# Patient Record
Sex: Male | Born: 1940 | Race: White | Hispanic: No | Marital: Married | Smoking: Former smoker
Health system: Southern US, Community
[De-identification: ages and names within clinical notes are randomized; demographics above are authoritative.]

## PROBLEM LIST (undated history)

## (undated) DIAGNOSIS — L988 Other specified disorders of the skin and subcutaneous tissue: Secondary | ICD-10-CM

## (undated) DIAGNOSIS — F329 Major depressive disorder, single episode, unspecified: Secondary | ICD-10-CM

## (undated) DIAGNOSIS — N189 Chronic kidney disease, unspecified: Secondary | ICD-10-CM

## (undated) DIAGNOSIS — I429 Cardiomyopathy, unspecified: Secondary | ICD-10-CM

## (undated) DIAGNOSIS — E669 Obesity, unspecified: Secondary | ICD-10-CM

## (undated) DIAGNOSIS — D649 Anemia, unspecified: Secondary | ICD-10-CM

## (undated) DIAGNOSIS — I214 Non-ST elevation (NSTEMI) myocardial infarction: Secondary | ICD-10-CM

## (undated) DIAGNOSIS — N138 Other obstructive and reflux uropathy: Secondary | ICD-10-CM

## (undated) DIAGNOSIS — D179 Benign lipomatous neoplasm, unspecified: Secondary | ICD-10-CM

## (undated) DIAGNOSIS — I5022 Chronic systolic (congestive) heart failure: Secondary | ICD-10-CM

## (undated) DIAGNOSIS — J449 Chronic obstructive pulmonary disease, unspecified: Secondary | ICD-10-CM

## (undated) DIAGNOSIS — R0602 Shortness of breath: Secondary | ICD-10-CM

## (undated) DIAGNOSIS — H5440 Blindness, one eye, unspecified eye: Secondary | ICD-10-CM

## (undated) DIAGNOSIS — M1990 Unspecified osteoarthritis, unspecified site: Secondary | ICD-10-CM

## (undated) DIAGNOSIS — N401 Enlarged prostate with lower urinary tract symptoms: Secondary | ICD-10-CM

## (undated) DIAGNOSIS — K219 Gastro-esophageal reflux disease without esophagitis: Secondary | ICD-10-CM

## (undated) DIAGNOSIS — I219 Acute myocardial infarction, unspecified: Secondary | ICD-10-CM

## (undated) DIAGNOSIS — I2510 Atherosclerotic heart disease of native coronary artery without angina pectoris: Secondary | ICD-10-CM

## (undated) DIAGNOSIS — H9190 Unspecified hearing loss, unspecified ear: Secondary | ICD-10-CM

## (undated) DIAGNOSIS — K3184 Gastroparesis: Secondary | ICD-10-CM

## (undated) DIAGNOSIS — I10 Essential (primary) hypertension: Secondary | ICD-10-CM

## (undated) DIAGNOSIS — R51 Headache: Secondary | ICD-10-CM

## (undated) DIAGNOSIS — K635 Polyp of colon: Secondary | ICD-10-CM

## (undated) DIAGNOSIS — I4891 Unspecified atrial fibrillation: Secondary | ICD-10-CM

## (undated) DIAGNOSIS — I9789 Other postprocedural complications and disorders of the circulatory system, not elsewhere classified: Secondary | ICD-10-CM

## (undated) DIAGNOSIS — K649 Unspecified hemorrhoids: Secondary | ICD-10-CM

## (undated) DIAGNOSIS — F32A Depression, unspecified: Secondary | ICD-10-CM

## (undated) HISTORY — DX: Unspecified atrial fibrillation: I48.91

## (undated) HISTORY — PX: EYE SURGERY: SHX253

## (undated) HISTORY — PX: NECK SURGERY: SHX720

## (undated) HISTORY — DX: Other specified disorders of the skin and subcutaneous tissue: L98.8

## (undated) HISTORY — PX: APPENDECTOMY: SHX54

## (undated) HISTORY — PX: SHOULDER SURGERY: SHX246

## (undated) HISTORY — DX: Chronic systolic (congestive) heart failure: I50.22

## (undated) HISTORY — DX: Non-ST elevation (NSTEMI) myocardial infarction: I21.4

## (undated) HISTORY — DX: Other obstructive and reflux uropathy: N13.8

## (undated) HISTORY — PX: SHOULDER ARTHROSCOPY: SHX128

## (undated) HISTORY — PX: HERNIA REPAIR: SHX51

## (undated) HISTORY — DX: Other obstructive and reflux uropathy: N40.1

## (undated) HISTORY — PX: CHOLECYSTECTOMY: SHX55

## (undated) HISTORY — DX: Other postprocedural complications and disorders of the circulatory system, not elsewhere classified: I97.89

---

## 1989-06-20 HISTORY — PX: KNEE SURGERY: SHX244

## 1997-12-15 ENCOUNTER — Encounter: Admission: RE | Admit: 1997-12-15 | Discharge: 1998-03-15 | Payer: Self-pay | Admitting: Internal Medicine

## 1998-03-24 ENCOUNTER — Encounter: Admission: RE | Admit: 1998-03-24 | Discharge: 1998-06-22 | Payer: Self-pay | Admitting: Internal Medicine

## 1998-06-26 ENCOUNTER — Encounter: Admission: RE | Admit: 1998-06-26 | Discharge: 1998-07-02 | Payer: Self-pay | Admitting: Internal Medicine

## 1998-10-05 ENCOUNTER — Encounter: Admission: RE | Admit: 1998-10-05 | Discharge: 1998-10-08 | Payer: Self-pay | Admitting: Orthopedic Surgery

## 1999-01-04 ENCOUNTER — Encounter: Admission: RE | Admit: 1999-01-04 | Discharge: 1999-01-18 | Payer: Self-pay | Admitting: Orthopedic Surgery

## 1999-01-20 ENCOUNTER — Encounter: Admission: RE | Admit: 1999-01-20 | Discharge: 1999-04-20 | Payer: Self-pay | Admitting: Orthopedic Surgery

## 1999-04-28 ENCOUNTER — Encounter: Admission: RE | Admit: 1999-04-28 | Discharge: 1999-07-27 | Payer: Self-pay | Admitting: Internal Medicine

## 1999-08-10 ENCOUNTER — Encounter: Admission: RE | Admit: 1999-08-10 | Discharge: 1999-11-08 | Payer: Self-pay | Admitting: Internal Medicine

## 1999-12-07 ENCOUNTER — Encounter: Admission: RE | Admit: 1999-12-07 | Discharge: 2000-03-06 | Payer: Self-pay | Admitting: Internal Medicine

## 2000-02-24 ENCOUNTER — Encounter: Admission: RE | Admit: 2000-02-24 | Discharge: 2000-05-24 | Payer: Self-pay | Admitting: Internal Medicine

## 2000-03-02 ENCOUNTER — Encounter (INDEPENDENT_AMBULATORY_CARE_PROVIDER_SITE_OTHER): Payer: Self-pay | Admitting: *Deleted

## 2000-03-02 ENCOUNTER — Ambulatory Visit (HOSPITAL_COMMUNITY): Admission: RE | Admit: 2000-03-02 | Discharge: 2000-03-02 | Payer: Self-pay | Admitting: Gastroenterology

## 2000-03-29 ENCOUNTER — Ambulatory Visit (HOSPITAL_COMMUNITY): Admission: RE | Admit: 2000-03-29 | Discharge: 2000-03-29 | Payer: Self-pay | Admitting: Cardiology

## 2000-04-12 ENCOUNTER — Encounter: Admission: RE | Admit: 2000-04-12 | Discharge: 2000-07-11 | Payer: Self-pay | Admitting: Internal Medicine

## 2000-05-29 ENCOUNTER — Encounter: Admission: RE | Admit: 2000-05-29 | Discharge: 2000-08-27 | Payer: Self-pay | Admitting: Internal Medicine

## 2000-07-26 ENCOUNTER — Encounter: Admission: RE | Admit: 2000-07-26 | Discharge: 2000-07-31 | Payer: Self-pay | Admitting: Internal Medicine

## 2000-08-28 ENCOUNTER — Encounter: Admission: RE | Admit: 2000-08-28 | Discharge: 2000-11-26 | Payer: Self-pay | Admitting: Internal Medicine

## 2000-11-01 ENCOUNTER — Encounter: Admission: RE | Admit: 2000-11-01 | Discharge: 2000-11-06 | Payer: Self-pay | Admitting: Internal Medicine

## 2000-12-07 ENCOUNTER — Encounter: Admission: RE | Admit: 2000-12-07 | Discharge: 2001-03-07 | Payer: Self-pay | Admitting: Internal Medicine

## 2001-03-07 ENCOUNTER — Encounter: Admission: RE | Admit: 2001-03-07 | Discharge: 2001-03-12 | Payer: Self-pay | Admitting: Internal Medicine

## 2001-03-20 ENCOUNTER — Encounter: Admission: RE | Admit: 2001-03-20 | Discharge: 2001-06-18 | Payer: Self-pay | Admitting: Internal Medicine

## 2001-04-08 ENCOUNTER — Emergency Department (HOSPITAL_COMMUNITY): Admission: EM | Admit: 2001-04-08 | Discharge: 2001-04-08 | Payer: Self-pay | Admitting: Emergency Medicine

## 2001-04-17 ENCOUNTER — Encounter: Payer: Self-pay | Admitting: Internal Medicine

## 2001-04-17 ENCOUNTER — Ambulatory Visit (HOSPITAL_COMMUNITY): Admission: RE | Admit: 2001-04-17 | Discharge: 2001-04-17 | Payer: Self-pay | Admitting: Internal Medicine

## 2001-04-18 ENCOUNTER — Encounter: Admission: RE | Admit: 2001-04-18 | Discharge: 2001-04-18 | Payer: Self-pay | Admitting: Internal Medicine

## 2001-06-27 ENCOUNTER — Encounter: Admission: RE | Admit: 2001-06-27 | Discharge: 2001-07-02 | Payer: Self-pay | Admitting: Internal Medicine

## 2001-09-11 ENCOUNTER — Ambulatory Visit (HOSPITAL_COMMUNITY): Admission: RE | Admit: 2001-09-11 | Discharge: 2001-09-11 | Payer: Self-pay | Admitting: Gastroenterology

## 2001-09-11 ENCOUNTER — Encounter (INDEPENDENT_AMBULATORY_CARE_PROVIDER_SITE_OTHER): Payer: Self-pay | Admitting: Specialist

## 2001-10-02 ENCOUNTER — Encounter (HOSPITAL_BASED_OUTPATIENT_CLINIC_OR_DEPARTMENT_OTHER): Admission: RE | Admit: 2001-10-02 | Discharge: 2001-10-08 | Payer: Self-pay | Admitting: Orthopedic Surgery

## 2001-12-19 ENCOUNTER — Encounter (HOSPITAL_BASED_OUTPATIENT_CLINIC_OR_DEPARTMENT_OTHER): Admission: RE | Admit: 2001-12-19 | Discharge: 2002-02-28 | Payer: Self-pay | Admitting: Internal Medicine

## 2002-03-20 ENCOUNTER — Encounter (HOSPITAL_BASED_OUTPATIENT_CLINIC_OR_DEPARTMENT_OTHER): Admission: RE | Admit: 2002-03-20 | Discharge: 2002-06-18 | Payer: Self-pay | Admitting: Internal Medicine

## 2002-07-02 ENCOUNTER — Encounter (HOSPITAL_BASED_OUTPATIENT_CLINIC_OR_DEPARTMENT_OTHER): Admission: RE | Admit: 2002-07-02 | Discharge: 2002-09-30 | Payer: Self-pay | Admitting: Internal Medicine

## 2002-09-02 ENCOUNTER — Ambulatory Visit (HOSPITAL_COMMUNITY): Admission: RE | Admit: 2002-09-02 | Discharge: 2002-09-02 | Payer: Self-pay | Admitting: Cardiology

## 2002-10-24 ENCOUNTER — Encounter (HOSPITAL_BASED_OUTPATIENT_CLINIC_OR_DEPARTMENT_OTHER): Admission: RE | Admit: 2002-10-24 | Discharge: 2003-01-22 | Payer: Self-pay | Admitting: Internal Medicine

## 2003-01-24 ENCOUNTER — Encounter (HOSPITAL_BASED_OUTPATIENT_CLINIC_OR_DEPARTMENT_OTHER): Admission: RE | Admit: 2003-01-24 | Discharge: 2003-03-19 | Payer: Self-pay | Admitting: Internal Medicine

## 2003-06-20 ENCOUNTER — Encounter (HOSPITAL_BASED_OUTPATIENT_CLINIC_OR_DEPARTMENT_OTHER): Admission: RE | Admit: 2003-06-20 | Discharge: 2003-09-11 | Payer: Self-pay | Admitting: Internal Medicine

## 2003-09-25 ENCOUNTER — Encounter (HOSPITAL_BASED_OUTPATIENT_CLINIC_OR_DEPARTMENT_OTHER): Admission: RE | Admit: 2003-09-25 | Discharge: 2003-11-26 | Payer: Self-pay | Admitting: Internal Medicine

## 2004-01-14 ENCOUNTER — Encounter (HOSPITAL_BASED_OUTPATIENT_CLINIC_OR_DEPARTMENT_OTHER): Admission: RE | Admit: 2004-01-14 | Discharge: 2004-03-24 | Payer: Self-pay | Admitting: Internal Medicine

## 2004-07-06 ENCOUNTER — Encounter (HOSPITAL_BASED_OUTPATIENT_CLINIC_OR_DEPARTMENT_OTHER): Admission: RE | Admit: 2004-07-06 | Discharge: 2004-08-27 | Payer: Self-pay | Admitting: Internal Medicine

## 2004-08-25 ENCOUNTER — Encounter: Admission: RE | Admit: 2004-08-25 | Discharge: 2004-08-25 | Payer: Self-pay | Admitting: Internal Medicine

## 2004-10-18 DIAGNOSIS — I214 Non-ST elevation (NSTEMI) myocardial infarction: Secondary | ICD-10-CM

## 2004-10-18 HISTORY — DX: Non-ST elevation (NSTEMI) myocardial infarction: I21.4

## 2004-10-19 ENCOUNTER — Ambulatory Visit (HOSPITAL_COMMUNITY): Admission: RE | Admit: 2004-10-19 | Discharge: 2004-10-20 | Payer: Self-pay | Admitting: Ophthalmology

## 2004-10-22 ENCOUNTER — Encounter (HOSPITAL_BASED_OUTPATIENT_CLINIC_OR_DEPARTMENT_OTHER): Admission: RE | Admit: 2004-10-22 | Discharge: 2005-01-20 | Payer: Self-pay | Admitting: Surgery

## 2004-11-15 ENCOUNTER — Inpatient Hospital Stay (HOSPITAL_COMMUNITY): Admission: EM | Admit: 2004-11-15 | Discharge: 2004-11-19 | Payer: Self-pay | Admitting: Emergency Medicine

## 2004-11-16 ENCOUNTER — Encounter (INDEPENDENT_AMBULATORY_CARE_PROVIDER_SITE_OTHER): Payer: Self-pay | Admitting: *Deleted

## 2004-12-20 ENCOUNTER — Encounter (HOSPITAL_COMMUNITY): Admission: RE | Admit: 2004-12-20 | Discharge: 2005-03-07 | Payer: Self-pay | Admitting: Cardiology

## 2005-07-18 ENCOUNTER — Encounter: Admission: RE | Admit: 2005-07-18 | Discharge: 2005-07-18 | Payer: Self-pay | Admitting: Internal Medicine

## 2005-07-19 ENCOUNTER — Encounter: Admission: RE | Admit: 2005-07-19 | Discharge: 2005-07-19 | Payer: Self-pay | Admitting: Internal Medicine

## 2005-08-15 ENCOUNTER — Inpatient Hospital Stay (HOSPITAL_COMMUNITY): Admission: AD | Admit: 2005-08-15 | Discharge: 2005-08-19 | Payer: Self-pay | Admitting: General Surgery

## 2005-08-15 ENCOUNTER — Encounter (INDEPENDENT_AMBULATORY_CARE_PROVIDER_SITE_OTHER): Payer: Self-pay | Admitting: *Deleted

## 2005-09-20 ENCOUNTER — Encounter: Admission: RE | Admit: 2005-09-20 | Discharge: 2005-09-20 | Payer: Self-pay | Admitting: Internal Medicine

## 2005-10-18 ENCOUNTER — Encounter: Admission: RE | Admit: 2005-10-18 | Discharge: 2005-10-18 | Payer: Self-pay | Admitting: Internal Medicine

## 2006-06-20 HISTORY — PX: AV FISTULA PLACEMENT: SHX1204

## 2006-10-23 ENCOUNTER — Ambulatory Visit: Payer: Self-pay | Admitting: Vascular Surgery

## 2006-11-15 ENCOUNTER — Encounter: Admission: RE | Admit: 2006-11-15 | Discharge: 2006-11-15 | Payer: Self-pay | Admitting: Internal Medicine

## 2006-11-28 ENCOUNTER — Ambulatory Visit: Payer: Self-pay | Admitting: Vascular Surgery

## 2006-11-28 ENCOUNTER — Ambulatory Visit (HOSPITAL_COMMUNITY): Admission: RE | Admit: 2006-11-28 | Discharge: 2006-11-28 | Payer: Self-pay | Admitting: Vascular Surgery

## 2007-02-09 ENCOUNTER — Ambulatory Visit: Payer: Self-pay | Admitting: Vascular Surgery

## 2007-08-21 ENCOUNTER — Encounter: Admission: RE | Admit: 2007-08-21 | Discharge: 2007-08-21 | Payer: Self-pay | Admitting: Geriatric Medicine

## 2008-02-01 ENCOUNTER — Ambulatory Visit: Payer: Self-pay | Admitting: Vascular Surgery

## 2010-01-29 ENCOUNTER — Emergency Department (HOSPITAL_COMMUNITY): Admission: EM | Admit: 2010-01-29 | Discharge: 2010-01-29 | Payer: Self-pay | Admitting: Emergency Medicine

## 2010-07-11 ENCOUNTER — Encounter: Payer: Self-pay | Admitting: Internal Medicine

## 2010-11-02 NOTE — Assessment & Plan Note (Signed)
OFFICE VISIT   FERGUS, THRONE  DOB:  11-10-1940                                       02/01/2008  GNFAO#:[id number]   The patient presents today for continued evaluation of AV access.  He is  status post placement of the right upper arm AV fistula creation on June  2008.  He continues to be relatively stable from a renal standpoint and  is not on hemodialysis.  His right upper arm fistula continues to have a  pulse in the proximal portion but does not have a thrill or bruit that  does not have a pulse in the distal portion.  I feel that he has  occluded the proximal cephalic vein and has static flow in the lower  portion of this.  I do not feel that there is any option for salvage of  the fistula which was essentially occluded.  My last note from Dr.  Eliott Nine reveals that his last BUN and creatinine in March were very  stable at 23 and 1.5 respectively.  I discussed this with the patient  and would not recommend attempt a new AV access since his renal function  has stabilized.  We would recommend attempted left upper arm AV fistula.  However, his vein mapping prior to the right arm fistula showed that the  left vein was smaller than the right with a high risk for early failure.  In all likelihood I think that if he does come to hemodialysis, he will  require an AV graft and would recommend delaying placement of this until  he approaches hemodialysis.   Larina Earthly, M.D.  Electronically Signed   TFE/MEDQ  D:  02/01/2008  T:  02/04/2008  Job:  [id number]   cc:   Duke Salvia. Eliott Nine, M.D.

## 2010-11-02 NOTE — Op Note (Signed)
NAME:  [name], [name] NO.:  [id number]   MEDICAL RECORD NO.:  [id number]          PATIENT TYPE:  AMB   LOCATION:  SDS                          FACILITY:  [facility]   PHYSICIAN:  Larina Earthly, M.D.    DATE OF BIRTH:  [birth year]   DATE OF PROCEDURE:  11/28/2006  DATE OF DISCHARGE:                               OPERATIVE REPORT   PREOPERATIVE DIAGNOSIS:  Chronic renal insufficiency.   POSTOPERATIVE DIAGNOSIS:  Chronic renal insufficiency.   PROCEDURES:  Right upper arm arteriovenous fistula creation.   SURGEON:  Larina Earthly, M.D.   ASSISTANT:  Coral Ceo, P.A.   ANESTHESIA:  Monitored anesthesia care.   COMPLICATIONS:  None.   DISPOSITION:  To recovery room stable.   PROCEDURE IN DETAIL:  The patient was taken to the operating room,  placed in supine position.  The area of the right arm prepped and draped  in the usual sterile fashion.  An incision was made over the antecubital  space, carried down to isolate the brachial artery, which was of good  caliber.  The cephalic vein was located slightly lateral and was  mobilized proximally and distally.  The vein was of adequate caliber at  the antecubital space and proximally did have multiple branches and  became quite small distally.  The small branches were ligated and  divided.  The vein was mobilized to the level of the brachial artery.  The artery was occluded proximally and distally and was opened with the  11 blade and dissected longitudinally with Potts scissors.  The vein was  spatulated and sewn end to side to the artery with a running 6-0 Prolene  suture.  The clamps were removed and good thrill was noted.  The wound  was irrigated with saline.  Hemostasis was obtained with electrocautery.  The wounds were closed with 3-0 Vicryl in the subcutaneous and  subcuticular tissue.  Benzoin and Steri-Strips were applied.      Larina Earthly, M.D.  Electronically Signed     TFE/MEDQ  D:  11/28/2006   T:  11/28/2006  Job:  [id number]

## 2010-11-02 NOTE — Assessment & Plan Note (Signed)
OFFICE VISIT   ORVILL, COULTHARD  DOB:  09-20-1940                                       02/09/2007  JXBJY#:[id number]   [name] presents today for evaluation of left AV fistula, creation  by me on November 24, 2006.  He has an upper arm right AV fistula.  He is  here with his wife today who is giving his history.  She reports that  Dr. Eliott Nine feels his renal function is stable.  He has had some mildly  elevated potassium.  They are trying to control this with diet.  His  earlier cubital incision is completely healed.  He does have a well-  developed cephalic vein near the antecubital space.  It does run rather  deep in the upper arm.  I explained to them that he certainly would not  be able to use this access, if he had renal dialysis imminently.  He  continued to have development of this, and I would recommend continued  observation only.  He may require surgery to immobilize the cephalic  vein more superficial, should he progress to renal failure.  I feel that  the longer his vein has to mature prior to this, the better his outcome  would be.  He understands this and will continue to exercise with a  squeeze ball, for the next several months.  He will see me on a p.r.n.  basis.   Larina Earthly, M.D.  Electronically Signed   TFE/MEDQ  D:  02/09/2007  T:  02/12/2007  Job:  [id number]   cc:   Duke Salvia. Eliott Nine, M.D.

## 2010-11-05 NOTE — Op Note (Signed)
NAME:  [name], [name] NO.:  [id number]   MEDICAL RECORD NO.:  [id number]          PATIENT TYPE:  OIB   LOCATION:  6524                         FACILITY:  [facility]   PHYSICIAN:  Guadelupe Sabin, M.D.DATE OF BIRTH:  04-11-1941   DATE OF PROCEDURE:  10/19/2004  DATE OF DISCHARGE:                                 OPERATIVE REPORT   PREOPERATIVE DIAGNOSES:  1.  Chronic and recurrent vitreous hemorrhage, left eye.  2.  Proliferative diabetic retinopathy, both eyes.  3.  Insulin-dependent diabetes mellitus.   POSTOPERATIVE DIAGNOSES:  1.  Chronic and recurrent vitreous hemorrhage, left eye.  2.  Proliferative diabetic retinopathy, both eyes.  3.  Insulin-dependent diabetes mellitus.   NAME OF OPERATION:  Pars plana vitrectomy using vitreous infusion suction  cutter, peripheral iridotomy, sphincterotomy.   SURGEON:  Guadelupe Sabin, M.D.   ASSISTANT:  Nurse.   ANESTHESIA:  General anesthesia.   OPERATIVE PROCEDURE:  After the patient was prepped and draped, a lid  speculum was inserted in the left eye.  The eye was turned downward and a  superior rectus traction suture placed.  A peritomy was performed adjacent  to the limbus from the 9 to 4 o'clock position temporally.  Three sclerotomy  sites were prepared at the 10, 2 and 4 o'clock position , 3.5 mm from the  limbus using the 20-gauge MVR blade.  The 4-mm vitreous infusion terminal  was secured in place at the 4 o'clock position with a 5-0 green Mersilene  suture.  The fiberoptic light pipe was inserted at the 2 o'clock position  and the handpiece of the vitreous infusion suction cutter at the 10 o'clock  position.  It was noted that the superior sclerotomy sites were temporarily  plugged and attention paid to the anterior segment.  The pupil was extremely  small at approximately 3 mm.  It was then elected to use the Grieshaber iris  retractors, which were placed at the 8, 10, 2 and  4 o'clock positions;  these were inserted through a tiny incision made with the [facility]  paracentesis knife.  There was a slight bleeding as the iris was retracted;  a good pupillary opening, however, was achieved, measuring approximately 6 x  5 mm.  Attention was then paid to the posterior segment, where the  fiberoptic light pipe was reinserted and the handpiece of the vitreous  infusion suction cutter.  Dense vitreous hemorrhage was encountered as the  vitreous and fibrillar material were removed.  As the retina was approached,  there appeared to be a proliferative tractional site on the temporal  superior area and this was trimmed back to the retinal surface.  Visualization was extremely poor due to the anterior chamber hemorrhage from  the iris retractors and the dense vitreous hemorrhage.  Gradually, however,  the surface blood in the anterior chamber cleared as well as the vitreous  hemorrhage aspirated.  The optic nerve was visualized and appeared to be  pale with a shallow large cup approximately 6-7 mm.  The retinal blood  vessels were attenuated; the macular area appeared clear.  Scleral  depression was used to remove the more peripheral blood clots.  The fundus  was inspected with indirect ophthalmoscopy and no further proliferative  sites were noted and the optic nerves showed no neovascularization.  Due to  the very extensive panretinal laser photocoagulation, it was elected not to  add additional laser at this time due to poor visualization and the  extensive laser photocoagulation previously.  The vitreous instruments were  withdrawn and the sclerotomy sites once again plugged.  Attention was paid  to the anterior segment, where the iris retractors were removed.  It was  elected to perform a tiny peripheral iridotomy with the handpiece of the  vitreous infusion suction cutter and multiple small sphincterotomies to  allow better visualization of the fundus postoperatively; this was performed   through the corneal iris retractor incision at the 10:30 position.  It was  then elected to close.  The sclerotomy sites were closed with 7-0  interrupted Vicryl sutures.  The tiny paracentesis openings were self-  sealing and no sutures were required.  The conjunctiva was pulled forward  and closed with a running 7-0 Vicryl suture.  Depo-Garamycin and  dexamethasone were injected in the sub-tenon space inferiorly.  Maxitrol and  atropine ointment were instilled in the conjunctival cul-de-sac.  A light  patch and protective shield were applied.  Duration of procedure -- 1 to 1-  1/2 hours.  The patient tolerated the procedure well in general and left the  operating room for the recovery room and subsequently to the 23-hour  observation unit.      HNJ/MEDQ  D:  10/19/2004  T:  10/20/2004  Job:  [id number]   cc:   Vernell Leep., M.D.  [address] [place]  [place] [address]  Fax: [phone]   Francisca December, M.D.  [address]  [place]  [place] [address]  Fax: [phone]   Georgann Housekeeper, MD  [address]  [place]  [place] [address]  Fax: [phone]

## 2010-11-05 NOTE — Discharge Summary (Signed)
NAME:  [name], [name] NO.:  [id number]   MEDICAL RECORD NO.:  [id number]          PATIENT TYPE:  INP   LOCATION:  6523                         FACILITY:  [facility]   PHYSICIAN:  Georgann Housekeeper, MD      DATE OF BIRTH:  1940/11/27   DATE OF ADMISSION:  11/15/2004  DATE OF DISCHARGE:  11/19/2004                                 DISCHARGE SUMMARY   DISCHARGE DIAGNOSES:  1.  Syncope, non-Q myocardial infarction.  2.  Angioplasty and catheterization angioplasty to left circumflex by      cardiology, Dr. Amil Amen.  3.  Type 2 diabetes.  4.  Hypertension.  5.  Anemia of chronic disease.  6.  History of mild renal insufficiency, resolved.  7.  Depression.   MEDICATIONS ON DISCHARGE:  1.  Lantus 45 units b.i.d.  2.  Byetta injection 10 mcg b.i.d.  3.  Avandia 8 mg daily.  4.  Lipitor 80 mg daily.  5.  Zetia 10 mg daily.  6.  Celexa 40 mg daily.  7.  Imdur 30 mg daily.  8.  Protonix 40 mg daily.  9.  Risperdal 1 mg at night.  10. Iron 325, 2 tablets daily.  11. TobraDex eye drops q.i.d.  12. Aspirin 325 daily.  13. Plavix 75 mg daily.  14. Mavik 4 mg one at bedtime.  15. Insulin sliding scale.   FOLLOWUP:  1.  With cardiology, Dr. Amil Amen.  2.  With me in two weeks.  3.  Home health services arranged.   DIET:  No sugar diet.   CONDITION ON DISCHARGE:  Stable.   CONSULTATION:  Cardiology.   PROCEDURE:  1.  Cardiac catheterization with angioplasty to the circumflex lesion.  2.  Echocardiogram, which showed an LV function of 45-50%.   LABORATORY DATA:  Significant cardiac enzymes:  Troponin was positive,  peaked at 0.88, with total CK peaked at 325 and was positive.  His white  count remained normal.  Hemoglobin at the time of discharge was 11.7.  Creatinine initially was 1.7.  At the time of discharge, it was 1.4.  Electrolytes were normal.  A1c was 7.6.  BNP was 131.  Iron studies showed  ferritin 69, iron 56, TIBC 316.  TSH 1.4.   HOSPITAL COURSE:  The  patient is 70 years old with a history of coronary  artery disease and diabetes.  Came in with episode of syncope.  He was  sitting with his wife in a hospital room and got diaphoretic, hypotension.  No chest pain or shortness of breath.  Sugar was 80.  1.  Cardiology.  The patient was put on telemetry.  No arrhythmias.  His EKG      was normal sinus rhythm, without any ST-T wave changes.  His cardiac      enzymes came back positive.  Troponin and cardiac markers were elevated.      Cardiology was consulted.  He remained hemodynamically stable.      Underwent a cardiac catheterization the next day that showed a tight 80%      circumflex lesion, which  was angioplastied successfully.  He was started      on Plavix, continued on aspirin.  He was on Lovenox.  Denies any chest      pain or shortness of breath.  2.  Anemia.  Hemoglobin was 10.3.  Iron studies showed mild anemia of      chronic disease.  He was on iron one a day, continued two a day.      Hemoglobin remained stable.  3.  Type 2 diabetes.  Blood sugars remained a little elevated, but he was      getting only half the dose of Lantus.  He was subsequently started back      on his normal doses.  Blood sugar at the time of discharge was 116.  A1c      was 7.6.  4.  History of mild renal insufficiency.  Creatinine 1.7.  He was started on      IV fluids.  Lasix was held.  Creatinine remained stable at 1.4.  His      Lasix was on hold at the time of discharge.  5.  Blood pressure remained stable.  6.  Recent eye surgery.  He continued on TobraDex eye drops, and he will      follow up with ophthalmology as an outpatient.  7.  Depression stable.   I will see him back in two weeks.      KH/MEDQ  D:  11/19/2004  T:  11/19/2004  Job:  [id number]
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
## 2010-11-05 NOTE — Op Note (Signed)
NAME:  [name], [name] NO.:  [id number]   MEDICAL RECORD NO.:  [id number]          PATIENT TYPE:  AMB   LOCATION:  SDS                          FACILITY:  [facility]   PHYSICIAN:  Gita Kudo, M.D. DATE OF BIRTH:  04/24/1941   DATE OF PROCEDURE:  08/15/2005  DATE OF DISCHARGE:                                 OPERATIVE REPORT   OPERATIVE PROCEDURE:  Attempted laparoscopic, open cholecystectomy.   SURGEON:  [place].   ASSISTANT:  [place].   ANESTHESIA:  General endotracheal.   PREOPERATIVE DIAGNOSIS:  Cholecystitis.   POSTOP DIAGNOSIS:  Cholecystitis, acute, gangrenous; cirrhosis of the liver.   CLINICAL SUMMARY:  70 year old male with documented gallstones having  symptoms was scheduled for elective surgery. He has had an acute attack and  was moved up on the schedule.   OPERATIVE FINDINGS:  The patient had a intense inflammatory reaction in the  right upper quadrant with a gangrenous gallbladder. The liver was cirrhotic.  Cholangiogram not obtained.   OPERATIVE PROCEDURE:  Under satisfactory general endotracheal anesthesia,  having received 1.0 grams Ancef, the patient's abdomen was prepped and  draped and standard fashion. Marcaine was infiltrated the umbilicus and a  transverse incision made above it and the midline opened into the  peritoneum. Controlled with a figure-of-eight zero Vicryl suture and  operating Hasson port inserted. Then two #5 ports inserted through lateral  incisions and a #10 through a epigastric incision after camera placed with  good CO2 pneumoperitoneum. I attempted to mobilize the gallbladder and free  the omentum from it and after approximately 15 minutes, realized that it  would be very difficult and potentially dangerous for the patient to persist  and I abandoned it and converted to open. The ports released, CO2 let out  and the midline closed with a previous suture and a second interrupted zero  Vicryl suture. Then a generous  right subcostal incision was made with  hemostasis intact by cautery. The omentum over the inflamed gallbladder was  gently dissected free and then packed away. With good exposure, I started  removing the gallbladder from above downward. It was quite adherent to the  posterior wall of the gallbladder and some of the posterior wall left in  place. The dissection continued downward slowly until we could define the  cystic artery which was controlled with clips and divided. The cystic duct  actually was also gangrenous but I was able to get two clips on it near the  common duct junction and to transect the duct proximally and it was removed.  I felt it would be unwise to try to perform any suturing in that area and  instead the liver bed was packed with Surgicel, lavaged with saline and  suctioned dry. It was not leaking bile or blood and a large #19 Blake drain  let in an inferolateral stab wound and led up to the operative site. All  sponges were released, the abdomen lavaged with saline, the operative site  checked for hemostasis which was good. Then the abdomen closed with layered  #1 PDS suture for the peritoneum  and posterior  sheath followed by double stranded #1 PDS for the anterior fascia. Skin  incisions approximated with staples, drain secured with suture, sterile  absorbent dressings applied. He tolerated the procedure well and went to the  recovery room from the operating room in good condition without  complication.           ______________________________  Gita Kudo, M.D.     MRL/MEDQ  D:  08/15/2005  T:  08/15/2005  Job:  [id number]   cc:   Georgann Housekeeper, MD  Fax: [phone]   Francisca December, M.D.  Fax: [phone]

## 2010-11-05 NOTE — Cardiovascular Report (Signed)
NAME:  [name], [name] NO.:  [id number]   MEDICAL RECORD NO.:  [id number]          PATIENT TYPE:  INP   LOCATION:  2007                         FACILITY:  [facility]   PHYSICIAN:  Meade Maw, M.D.    DATE OF BIRTH:  12-06-1940   DATE OF PROCEDURE:  11/17/2004  DATE OF DISCHARGE:                              CARDIAC CATHETERIZATION   PROCEDURE PERFORMED:  Left heart catheterization, coronary angiography.   INDICATIONS FOR PROCEDURE:  Cardiac enzymes positive for non-ST elevation MI  and syncopal episode.   DESCRIPTION OF PROCEDURE:  After obtaining written and informed consent, the  patient was brought to the cardiac catheterization lab in the post  absorptive state. The right groin was prepped and draped in the usual  sterile fashion. Local anesthesia was achieved using 1% Xylocaine. A 6-  [place] hemostasis sheath was placed into the right femoral artery using a  modified Seldinger technique. Selective coronary angiography was performed  using JL-4 and a JR-4 Judkins catheter. Multiple views were obtained. All  catheter exchanges were made over a guidewire. Single-plane ventriculogram  was performed in the RAO position using a 6-French pigtail curved catheter.  Hemostasis sheath was flushed following each catheter exchange.   FINDINGS:  Aortic pressure was 138/79. LV pressure was 140/13. His EDP is 16  to 19.   SINGLE-PLANE VENTRICULOGRAM:  In the RAO position demonstrated moderate  diffuse hypokinesis, ejection fraction estimated 40-45%. There was no mitral  regurgitation noted.   CORONARY ANGIOGRAPHY:  1.  Left main coronary artery bifurcates into the left anterior descending      and circumflex vessel. There is no significant disease noted in the left      main coronary artery.  2.  Left anterior descending:  Left anterior descending artery and its      branches were moderately disease. There was an approximately 60% lesion      following the first large  diagonal. The left anterior descending itself      tapers at the apex. The first diagonal was also noted to have a 50-60%      lesion.  3.  Circumflex vessel: Circumflex vessel and its branches are moderately      disease. There is an 80% lesion noted in the circumflex vessel following      the first obtuse marginal.  4.  Right coronary artery:  Right coronary artery is moderately diffusely      diseased. There is a 30-40% proximal lesion in the more distal segment.      There is a 40% lesion prior to the posterior descending artery. The      posterior descending artery is a large artery.   FINAL IMPRESSION:  1.  Moderate diffuse hypokinesis, ejection fraction 40-45%.  2.  Critical disease in the mid circumflex vessel.  3.  Borderline disease in the proximal left anterior descending.   PLAN:  The films will be reviewed with Dr. Corliss Marcus. Further  intervention pending his review.      HP/MEDQ  D:  11/17/2004  T:  11/17/2004  Job:  [id number]  cc:   Francisca December, M.D.  [address]  [address]  [place]  [place] [address]  Fax: [phone]   Ladell Pier, M.D.  Fax: [phone]

## 2010-11-05 NOTE — Cardiovascular Report (Signed)
NAME:  [name], WAREHIME NO.:  [id number]   MEDICAL RECORD NO.:  [id number]          PATIENT TYPE:  INP   LOCATION:  2007                         FACILITY:  [facility]   PHYSICIAN:  Francisca December, M.D.  DATE OF BIRTH:  1941-05-18   DATE OF PROCEDURE:  11/18/2004  DATE OF DISCHARGE:                              CARDIAC CATHETERIZATION   PROCEDURES PERFORMED:  1.  PCI/drug-eluting stent implantation, mid left circumflex coronary      artery.  2.  Percutaneous closure of the right femoral artery.   INDICATIONS:  Mr. Giomar Gusler is a 70 year old diabetic, obese man who  experienced  syncope three days ago. He ruled in for a non-ST segment  elevation MI. Dr. Meade Maw completed coronary angiography yesterday,  revealing a subtotal stenosis in the mid circumflex and a diffuse stenosis  in the LAD;  which does not appear to be critical in nature. He is to  undergo catheter-based revascularization of the left circumflex at this  time.   PROCEDURAL NOTE:  The patient is brought to cardiac catheterization  laboratory in the fasting state. The right groin was prepped and draped in  usual sterile fashion. Local anesthesia was obtained with infiltration of 1%  lidocaine. A 7-French catheter sheath was inserted into the right femoral  artery,  utilizing an anterior approach over a  guiding J-wire. A 4-French  catheter sheath was inserted percutaneously into the right femoral vein for  intravenous access. The patient then received 6300 units of heparin and a  bolus of Aggrastat at 25 mcg/kg, as well as a constant infusion. The  resultant ACT was 283 seconds. A 3.5 left Voda guiding catheter was advanced  to the ascending aorta, where coronary os was engaged. A 0.014 inch Luge  intracoronary guidewire was passed across the lesion in the mid circumflex  without difficulty. An additional Luge wire was advanced into the first  marginal branch, which was at the left circumflex  stenosis, but not  involving it. Initial balloon dilatation was performed with 3.0 x 15 SciMed  Maverick intracoronary balloon.  This was  inflated at 8 atm for  approximately 1 min. This balloon was deflated and removed, and a 3.5 x 23  mm Cordis Cypher intracoronary drug-eluting stent was advanced into place in  the mid left circumflex and inflated to peak pressure of 14 atm for about 45  seconds. The stent balloon was deflated and removed,  and the guidewire into  the OM was removed as well. The 3.5 x 12 mm SciMed Quantum Maverick was  advanced in the mid portion of stent and inflated to peak pressure of 16 atm  for approximately 30 seconds. This balloon was deflated and removed.  Multiple intracoronary doses of 0.2 mg nitroglycerin were administered.  Cineangiography confirmed adequate patency in orthogonal views, both with  and without the guide wire, of  both the main circumflex and the OM. The  guiding catheter was then removed. A right femoral arteriogram was performed  in the 45 degree angulation via the catheter sheath by hand injection.  Documented adequate anatomy for  placement of the percutaneous closure device  AngioSeal. This was successfully deployed with good hemostasis and intact  distal pulse.   Angiography as mentioned; the lesion treated was in the mid portion of the  left circumflex and was somewhat calcified, rather focal, 90% stenotic and  not involving a fairly large OM. The obstructive lesion was just beyond the  origin of the OM. Following balloon dilatation and stent implantation, there  was no residual stenosis in the left circumflex or in the obtuse marginal  itself of any significance.   FINAL IMPRESSION:  1.  Atherosclerotic coronary vascular disease, two-vessel.  2.  S/P successful PCI/drug-eluting stent implantation, mid left circumflex.  3.  Typical angina was not reproduced with device insertion and balloon      inflation.      JHE/MEDQ  D:   11/18/2004  T:  11/18/2004  Job:  [id number]

## 2010-11-05 NOTE — Discharge Summary (Signed)
NAME:  [name], HOQUE NO.:  [id number]   MEDICAL RECORD NO.:  [id number]          PATIENT TYPE:  INP   LOCATION:  5707                         FACILITY:  [facility]   PHYSICIAN:  Gita Kudo, M.D. DATE OF BIRTH:  1940/11/10   DATE OF ADMISSION:  08/15/2005  DATE OF DISCHARGE:  08/19/2005                                 DISCHARGE SUMMARY   CHIEF COMPLAINT:  Gallstones.   HISTORY OF PRESENT ILLNESS:  This 70 year old male with multiple medical  problems is brought in for elective cholecystectomy.  He has had several  bouts of abdominal pain and abnormal gallbladder ultrasound and laboratory  studies.  He has had a cardiac clearance.  Dr. Donette Larry is his medical doctor  and following him for diabetes.   LABORATORY STUDIES:  Pathology:  Cholecystitis - acute, gangrenous.  No  evidence of malignancy.   Initial hemoglobin 11; hematocrit 32; white count 14,000.  Followup  hemoglobin 10; hematocrit 30; white count 12,000.  His serum glucose was  followed and was erratic, ranging from 89 up to 279.  He was maintained on  insulin per protocol.  His liver function studies were within normal limits  including his bilirubin.   His vital signs remained stable throughout.  He was basically afebrile, had  good urinary output and cardiovascular-wise had no complications.   HOSPITAL COURSE:  On the morning of admission the patient had an attempted  laparoscopic cholecystectomy.  Because of the acute findings, he underwent  an open cholecystectomy.  At surgery his liver was very knobby and firm and  consistent with cirrhosis.  I did not biopsy it.  The gallbladder was  removed with some difficulty, and postoperatively he did pretty well.  He  had drains in place.  He was tolerating a diet, his diabetes was moderately  well-controlled with the insulin coverage.  He was maintained on antibiotics  - Tygacil.  He slowly improved and was able to be discharged from the  hospital on  August 19, 2005, his third postoperative day.  His Jackson-Pratt  was left in to be removed in the office.  His incisions were healing and his  laboratory studies were stable.  Accordingly, he improved to the point when  he was discharged on March 2 to be followed as above by myself and Dr.  Donette Larry.   DISCHARGE DIAGNOSES:  1.  Acute cholecystitis.  2.  Chronic cardiovascular disease.  3.  Diabetes.   OPERATIONS:  Open cholecystectomy.   COMPLICATIONS, INFECTIONS:  None.   CONSULTATIONS:  Dr. Donette Larry.   CONDITION AT DISCHARGE:  Good.           ______________________________  Gita Kudo, M.D.     MRL/MEDQ  D:  10/03/2005  T:  10/03/2005  Job:  [id number]   cc:   Georgann Housekeeper, MD  Fax: [phone]

## 2010-11-05 NOTE — Cardiovascular Report (Signed)
[place]  Patient:    FREMAN, LAPAGE                      MRN: [id number] Proc. Date: 03/29/00 Adm. Date:  [id number] Attending:  Corliss Marcus CC:         Tonie Griffith, M.D.  Cath Lab   Cardiac Catheterization  INDICATIONS:  Mr. Steel Kerney is a 70 year old diabetic man with a remote history of myocardial infarction.  He complains of worsening exertional dyspnea.  A myocardial perfusion study has shown reversible inferior and inferolateral defect.  He was brought to the catheterization laboratory at this time to identify extent of the disease and provide further therapeutic options.  DESCRIPTION OF PROCEDURE:  Left heart catheterization was performed following percutaneous insertion of a 5 French catheter sheath utilizing an anterior approach over a guiding J-wire into the right femoral artery.  A 110 cm pigtail catheter was used to measure pressures in the ascending aorta and left ventricle as well as perform left ventriculography in a 30 degree RAO angulation.  5 [place] #4 left and right Judkins catheters were used to perform cineangiography of each coronary artery  in multiple LAO and RAO projections. At completion of left heart catheterization, I proceeded with right heart catheterization.  A 6 French catheter sheath was inserted percutaneously into the right femoral vein.  A 6 [place] multipurpose A1 catheter was passed through the right heart chambers to the pulmonary artery wedge position. Pressure was recorded with the catheter in the pulmonary artery wedge position, the main pulmonary artery, the right ventricle, and the right atrium. Blood samples for oxygen saturation determination by oximetry method obtained from the superior vena cava, the main pulmonary artery, and the femoral artery.  At the completion of the procedure, the catheter and catheter sheaths were removed.  Hemostasis was achieved by direct pressure.  The  patient was transported to the [facility] in stable condition with intact distal pulses.  HEMODYNAMICS:  Utilizing Fick principal and an estimated oxygen consumption of 338 ml/min the calculated cardiac output is 6.17 l/min and the cardiac index 2.72 l/min/sq m.  There was no systolic gradient across the aortic valve nor was there was a diastolic gradient across the mitral valve, however, pulmonary artery wedge pressure and left ventricular end diastolic pressure were obtained nonsimultaneously.  Right atrial pressure was 12 mmHg A wave, 11 mmHg V wave, mean 11 mmHg.  Right ventricular pressure was 29/11 mmHg.  Pulmonary artery pressure was 29/14 mmHg with a mean of 21 mmHg.  Pulmonary capillary wedge pressure was 17 mmHg A wave, 19 mmHg V wave, and 15 mmHg mean.  Aortic pressure was 143/84 with a mean of 109 mmHg.  Left ventricular end diastolic pressure was 17 mmHg preventriculogram and 19 mmHg postventriculogram. Pulmonary vascular resistance was 77 dyne-sec-cm-5.  ANGIOGRAPHY:  The left ventriculogram demonstrated mild left ventricular dilatation and very mild global hypokinesis.  This was perhaps somewhat more severe in the anterolateral wall and apex.  A visual estimate of the ejection fraction is 45 to 50%.  The calculated ejection fraction utilizing a single cinemethod was 53%.  There was no mitral reguritation.  There was right and left coronary calcifications seen.  There was a right dominant coronary system present and the main left coronary artery was normal.  The left anterior descending artery and its branches were moderately diseased; the vessel was relatively small and tapers rapidly.  It reaches the apex,  but does not traverse it.  There is a large first diagonal branch that has a 60% midvessel stenosis.  The ongoing LAD has a 40% midvessel stenosis after the origin of this large diagonal.  The left circumflex artery and its branches were mildly diseased; the  vessel has luminal irregularities throughout its course.  There is a very small first marginal branch and a large second marginal branch.  The second marginal branch has no significant obstructions.  The ongoing circumflex has a 30 to 40% stenosis before it gives rise to two true obtuse marginal branches which are generally free of disease, one of which is small and the other is moderate in size.  The right coronary artery and its branches are also moderately diseased; this vessel is diffusely narrowed and heavily calcified in the proximal and midvessel segment.  There is a diffuse 30 to 40% proximal stenosis.  In the more distal segment, there is a focal 30 to 40% stenosis before the vessel bifurcates into the posterolateral segment and the posterior descending artery. The posterior descending artery is moderate to large.  The posterolateral segment is moderate in size giving rise to several small postoleratel branches.  There are no significant obstructions within these two vessels.  Collateral vessels are not seen.  FINAL IMPRESSION: 1. Normal right heart pressures. 2. Normal cardiac output. 3. Lower limit of normal left ventricular systolic function with regional    wall motion abnormalities noted. 4. Moderate atherosclerotic coronary artery disease, but without focal    critical stenosis.  PLAN/RECOMMENDATIONS:  The perfusion images suggested a right coronary distal circumflex artery stenosis by the presence of a reversible inferior and inferolateral defect.  No corresponding critical stenosis could be identified. He does have moderate CAD and likely has small vessel involvement with the history of diabetes.  Therefore, will initiate ACE inhibitor therapy and longacting nitrates orally.  If no improvement is seen in his symptoms, a low dose diuretic may be added. DD:  03/29/00 TD:  03/29/00 Job: [id number]

## 2010-11-05 NOTE — H&P (Signed)
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   PRESENT ILLNESS:  This patient has a long history of insulin-dependent  diabetes mellitus and secondary complications of diabetic retinopathy and  bilateral cataract formation. He has undergone previous bilateral cataract  surgery by Dr. Emmit Pomfret at the [facility] and extensive laser  photocoagulation for diabetic retinopathy by Dr. Ashley Royalty.  These occurred  during the period of 1996 and 1999. The patient recently has been followed  by Dr. Donalda Ewings. Dr. Harlin Heys referred the patient to my office in April  with a history of decreased vision due to the vitreous hemorrhage or  possible retinal detachment. The patient was evaluated in my office and  found to have a visual acuity of 20/40 right eye with correction, hand  motion left eye. Applanation tonometry 16 mm each eye. Slit lamp examination  showed the eyes to be white and clear with posterior chamber intraocular  lens implants. Pupillary dilation of the left eye was poor at approximately  3 mm. Dilated detailed fundus examination revealed what was felt to be a  vitreous hemorrhage, but retinal details were obscured. B scan  ultrasonography showed vitreous hemorrhage and possible proliferative  changes at the posterior retina with possible tractional retinal detachment.  It was felt that the patient warranted vitreous surgery, and he was  scheduled for surgery at this time. The patient received printed information  and oral discussion concerning the procedure and its possible complications.  He signed an informed consent, and arrangements were made for his outpatient  admission at this time.   PAST MEDICAL HISTORY:  The patient is under the general care of Dr. Donette Larry  and his cardiologist, Dr. Amil Amen. The patient is said to have insulin-  dependent diabetes mellitus, adult onset, with rather poor control of his  blood sugars.   The patient currently takes 45 units of Lantus insulin in the morning and 45  at supper time. In addition, he uses Humalog for additional blood sugar  coverage. The patient also takes oral medications for blood sugar control  including Amaryl 8 mg a day. Other medications include medications for  asthma for chronic emphysema. The patient uses oxygen at night. (See the  patient's attached list of medications.)   REVIEW OF SYSTEMS:  No cardiorespiratory complaints.   PHYSICAL EXAMINATION:  VITAL SIGNS:  As recorded on admission.  GENERAL APPEARANCE:  The patient is an obese, deaf white male in no acute  distress.  HEENT:  Eyes:  Ocular exam as noted above.  CHEST:  Lungs clear to percussion and auscultation.  HEART:  Normal sinus rhythm, no cardiomegaly.  No murmurs.  ABDOMEN:  Negative.  EXTREMITIES:  Negative   ADMISSION DIAGNOSES:  1.  Chronic and recurrent vitreous hemorrhage, left eye.  2.  Insulin-dependent diabetes mellitus.  3.  Proliferative diabetic retinopathy status post panretinal laser      photocoagulation in both eyes.  4.  Pseudophakia following cataract implant surgery.   SURGICAL PLAN:  1.  Posterior vitrectomy through pars plana using vitreous infusion suction      cutter with possible retinal detachment repair, endolaser      photocoagulation, or membrane peeling.      HNJ/MEDQ  D:  10/19/2004  T:  10/19/2004  Job:  [id number]   cc:   Lyn Henri, M.D.  Include EKG, laboratory data, and  chest x-ray material   Georgann Housekeeper, MD  [address]  [place]  [place] [address]  Fax: [phone]   Francisca December, M.D.  [address]  [place]  [place] [address]  Fax: [phone]

## 2011-04-07 LAB — POCT I-STAT 4, (NA,K, GLUC, HGB,HCT)
Glucose, Bld: 225 — ABNORMAL HIGH
Potassium: 4
Sodium: 136

## 2011-05-21 HISTORY — PX: COLONOSCOPY: SHX174

## 2011-06-16 ENCOUNTER — Other Ambulatory Visit: Payer: Self-pay | Admitting: Gastroenterology

## 2012-02-08 ENCOUNTER — Other Ambulatory Visit: Payer: Self-pay | Admitting: Cardiology

## 2012-02-09 ENCOUNTER — Other Ambulatory Visit: Payer: Self-pay | Admitting: Cardiology
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
## 2012-02-14 ENCOUNTER — Encounter (HOSPITAL_BASED_OUTPATIENT_CLINIC_OR_DEPARTMENT_OTHER): Admission: RE | Payer: Self-pay | Source: Ambulatory Visit

## 2012-02-14 ENCOUNTER — Inpatient Hospital Stay (HOSPITAL_BASED_OUTPATIENT_CLINIC_OR_DEPARTMENT_OTHER): Admission: RE | Admit: 2012-02-14 | Payer: Medicare Other | Source: Ambulatory Visit | Admitting: Cardiology

## 2012-02-14 SURGERY — JV LEFT HEART CATHETERIZATION WITH CORONARY ANGIOGRAM
Anesthesia: Moderate Sedation

## 2012-02-19 DIAGNOSIS — I2510 Atherosclerotic heart disease of native coronary artery without angina pectoris: Secondary | ICD-10-CM

## 2012-02-19 HISTORY — DX: Atherosclerotic heart disease of native coronary artery without angina pectoris: I25.10

## 2012-02-24 ENCOUNTER — Other Ambulatory Visit: Payer: Self-pay | Admitting: Cardiology

## 2012-02-24 ENCOUNTER — Encounter (HOSPITAL_COMMUNITY): Payer: Self-pay | Admitting: Pharmacy Technician
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
## 2012-02-27 ENCOUNTER — Observation Stay (HOSPITAL_COMMUNITY): Payer: Medicare Other

## 2012-02-27 ENCOUNTER — Inpatient Hospital Stay (HOSPITAL_COMMUNITY)
Admission: AD | Admit: 2012-02-27 | Discharge: 2012-03-27 | DRG: 233 | Disposition: A | Discharge status: Skilled Nursing Facility | Payer: Medicare Other | Source: Ambulatory Visit | Attending: Cardiothoracic Surgery | Admitting: Cardiothoracic Surgery

## 2012-02-27 ENCOUNTER — Encounter (HOSPITAL_COMMUNITY): Payer: Self-pay | Admitting: General Practice

## 2012-02-27 DIAGNOSIS — K3184 Gastroparesis: Secondary | ICD-10-CM | POA: Diagnosis present

## 2012-02-27 DIAGNOSIS — E8779 Other fluid overload: Secondary | ICD-10-CM | POA: Diagnosis not present

## 2012-02-27 DIAGNOSIS — E1129 Type 2 diabetes mellitus with other diabetic kidney complication: Secondary | ICD-10-CM | POA: Diagnosis present

## 2012-02-27 DIAGNOSIS — N390 Urinary tract infection, site not specified: Secondary | ICD-10-CM | POA: Diagnosis not present

## 2012-02-27 DIAGNOSIS — H5440 Blindness, one eye, unspecified eye: Secondary | ICD-10-CM | POA: Diagnosis present

## 2012-02-27 DIAGNOSIS — I2589 Other forms of chronic ischemic heart disease: Secondary | ICD-10-CM | POA: Diagnosis present

## 2012-02-27 DIAGNOSIS — R5381 Other malaise: Secondary | ICD-10-CM | POA: Diagnosis not present

## 2012-02-27 DIAGNOSIS — N058 Unspecified nephritic syndrome with other morphologic changes: Secondary | ICD-10-CM | POA: Diagnosis present

## 2012-02-27 DIAGNOSIS — G541 Lumbosacral plexus disorders: Secondary | ICD-10-CM | POA: Diagnosis not present

## 2012-02-27 DIAGNOSIS — F329 Major depressive disorder, single episode, unspecified: Secondary | ICD-10-CM | POA: Diagnosis present

## 2012-02-27 DIAGNOSIS — I252 Old myocardial infarction: Secondary | ICD-10-CM

## 2012-02-27 DIAGNOSIS — D62 Acute posthemorrhagic anemia: Secondary | ICD-10-CM | POA: Diagnosis not present

## 2012-02-27 DIAGNOSIS — D72829 Elevated white blood cell count, unspecified: Secondary | ICD-10-CM | POA: Diagnosis not present

## 2012-02-27 DIAGNOSIS — E1149 Type 2 diabetes mellitus with other diabetic neurological complication: Secondary | ICD-10-CM | POA: Diagnosis present

## 2012-02-27 DIAGNOSIS — F3289 Other specified depressive episodes: Secondary | ICD-10-CM | POA: Diagnosis present

## 2012-02-27 DIAGNOSIS — E1142 Type 2 diabetes mellitus with diabetic polyneuropathy: Secondary | ICD-10-CM | POA: Diagnosis present

## 2012-02-27 DIAGNOSIS — R339 Retention of urine, unspecified: Secondary | ICD-10-CM | POA: Diagnosis not present

## 2012-02-27 DIAGNOSIS — E1139 Type 2 diabetes mellitus with other diabetic ophthalmic complication: Secondary | ICD-10-CM | POA: Diagnosis present

## 2012-02-27 DIAGNOSIS — K661 Hemoperitoneum: Secondary | ICD-10-CM | POA: Diagnosis not present

## 2012-02-27 DIAGNOSIS — K219 Gastro-esophageal reflux disease without esophagitis: Secondary | ICD-10-CM | POA: Diagnosis present

## 2012-02-27 DIAGNOSIS — E11319 Type 2 diabetes mellitus with unspecified diabetic retinopathy without macular edema: Secondary | ICD-10-CM | POA: Diagnosis present

## 2012-02-27 DIAGNOSIS — E878 Other disorders of electrolyte and fluid balance, not elsewhere classified: Secondary | ICD-10-CM | POA: Diagnosis not present

## 2012-02-27 DIAGNOSIS — M129 Arthropathy, unspecified: Secondary | ICD-10-CM | POA: Diagnosis present

## 2012-02-27 DIAGNOSIS — Z9861 Coronary angioplasty status: Secondary | ICD-10-CM

## 2012-02-27 DIAGNOSIS — Z794 Long term (current) use of insulin: Secondary | ICD-10-CM

## 2012-02-27 DIAGNOSIS — H543 Unqualified visual loss, both eyes: Secondary | ICD-10-CM | POA: Diagnosis present

## 2012-02-27 DIAGNOSIS — T82898A Other specified complication of vascular prosthetic devices, implants and grafts, initial encounter: Secondary | ICD-10-CM | POA: Diagnosis present

## 2012-02-27 DIAGNOSIS — E119 Type 2 diabetes mellitus without complications: Secondary | ICD-10-CM | POA: Diagnosis present

## 2012-02-27 DIAGNOSIS — I200 Unstable angina: Secondary | ICD-10-CM | POA: Diagnosis present

## 2012-02-27 DIAGNOSIS — J4489 Other specified chronic obstructive pulmonary disease: Secondary | ICD-10-CM | POA: Diagnosis present

## 2012-02-27 DIAGNOSIS — F0390 Unspecified dementia without behavioral disturbance: Secondary | ICD-10-CM | POA: Diagnosis not present

## 2012-02-27 DIAGNOSIS — H9190 Unspecified hearing loss, unspecified ear: Secondary | ICD-10-CM | POA: Diagnosis present

## 2012-02-27 DIAGNOSIS — Z79899 Other long term (current) drug therapy: Secondary | ICD-10-CM

## 2012-02-27 DIAGNOSIS — I129 Hypertensive chronic kidney disease with stage 1 through stage 4 chronic kidney disease, or unspecified chronic kidney disease: Secondary | ICD-10-CM | POA: Diagnosis present

## 2012-02-27 DIAGNOSIS — Z87891 Personal history of nicotine dependence: Secondary | ICD-10-CM

## 2012-02-27 DIAGNOSIS — B965 Pseudomonas (aeruginosa) (mallei) (pseudomallei) as the cause of diseases classified elsewhere: Secondary | ICD-10-CM | POA: Diagnosis not present

## 2012-02-27 DIAGNOSIS — E669 Obesity, unspecified: Secondary | ICD-10-CM | POA: Diagnosis present

## 2012-02-27 DIAGNOSIS — K560 Paralytic ileus: Secondary | ICD-10-CM | POA: Diagnosis not present

## 2012-02-27 DIAGNOSIS — I509 Heart failure, unspecified: Secondary | ICD-10-CM | POA: Diagnosis present

## 2012-02-27 DIAGNOSIS — Y832 Surgical operation with anastomosis, bypass or graft as the cause of abnormal reaction of the patient, or of later complication, without mention of misadventure at the time of the procedure: Secondary | ICD-10-CM | POA: Diagnosis present

## 2012-02-27 DIAGNOSIS — N170 Acute kidney failure with tubular necrosis: Secondary | ICD-10-CM | POA: Diagnosis not present

## 2012-02-27 DIAGNOSIS — K5900 Constipation, unspecified: Secondary | ICD-10-CM | POA: Diagnosis not present

## 2012-02-27 DIAGNOSIS — J9819 Other pulmonary collapse: Secondary | ICD-10-CM | POA: Diagnosis not present

## 2012-02-27 DIAGNOSIS — D696 Thrombocytopenia, unspecified: Secondary | ICD-10-CM | POA: Diagnosis present

## 2012-02-27 DIAGNOSIS — E1169 Type 2 diabetes mellitus with other specified complication: Secondary | ICD-10-CM | POA: Diagnosis not present

## 2012-02-27 DIAGNOSIS — I2510 Atherosclerotic heart disease of native coronary artery without angina pectoris: Principal | ICD-10-CM | POA: Diagnosis present

## 2012-02-27 DIAGNOSIS — I4891 Unspecified atrial fibrillation: Secondary | ICD-10-CM | POA: Diagnosis not present

## 2012-02-27 DIAGNOSIS — N183 Chronic kidney disease, stage 3 unspecified: Secondary | ICD-10-CM | POA: Diagnosis present

## 2012-02-27 DIAGNOSIS — J449 Chronic obstructive pulmonary disease, unspecified: Secondary | ICD-10-CM | POA: Diagnosis present

## 2012-02-27 DIAGNOSIS — E871 Hypo-osmolality and hyponatremia: Secondary | ICD-10-CM | POA: Diagnosis present

## 2012-02-27 HISTORY — DX: Blindness, one eye, unspecified eye: H54.40

## 2012-02-27 HISTORY — DX: Gastroparesis: K31.84

## 2012-02-27 HISTORY — DX: Unspecified osteoarthritis, unspecified site: M19.90

## 2012-02-27 HISTORY — DX: Polyp of colon: K63.5

## 2012-02-27 HISTORY — DX: Essential (primary) hypertension: I10

## 2012-02-27 HISTORY — DX: Atherosclerotic heart disease of native coronary artery without angina pectoris: I25.10

## 2012-02-27 HISTORY — DX: Shortness of breath: R06.02

## 2012-02-27 HISTORY — DX: Depression, unspecified: F32.A

## 2012-02-27 HISTORY — DX: Major depressive disorder, single episode, unspecified: F32.9

## 2012-02-27 HISTORY — DX: Unspecified hemorrhoids: K64.9

## 2012-02-27 HISTORY — DX: Headache: R51

## 2012-02-27 HISTORY — DX: Acute myocardial infarction, unspecified: I21.9

## 2012-02-27 HISTORY — DX: Gastro-esophageal reflux disease without esophagitis: K21.9

## 2012-02-27 HISTORY — DX: Chronic kidney disease, unspecified: N18.9

## 2012-02-27 HISTORY — DX: Cardiomyopathy, unspecified: I42.9

## 2012-02-27 HISTORY — DX: Benign lipomatous neoplasm, unspecified: D17.9

## 2012-02-27 HISTORY — DX: Chronic obstructive pulmonary disease, unspecified: J44.9

## 2012-02-27 HISTORY — DX: Obesity, unspecified: E66.9

## 2012-02-27 HISTORY — DX: Unspecified hearing loss, unspecified ear: H91.90

## 2012-02-27 LAB — CBC
HCT: 34.1 % — ABNORMAL LOW (ref 39.0–52.0)
Hemoglobin: 11.8 g/dL — ABNORMAL LOW (ref 13.0–17.0)
MCH: 31.6 pg (ref 26.0–34.0)
MCHC: 34.6 g/dL (ref 30.0–36.0)
MCV: 91.4 fL (ref 78.0–100.0)

## 2012-02-27 LAB — COMPREHENSIVE METABOLIC PANEL
ALT: 27 U/L (ref 0–53)
AST: 24 U/L (ref 0–37)
Alkaline Phosphatase: 54 U/L (ref 39–117)
CO2: 26 mEq/L (ref 19–32)
GFR calc Af Amer: 50 mL/min — ABNORMAL LOW (ref >90)
GFR calc non Af Amer: 43 mL/min — ABNORMAL LOW (ref >90)
Glucose, Bld: 72 mg/dL (ref 70–99)
Potassium: 4.4 mEq/L (ref 3.5–5.1)
Sodium: 130 mEq/L — ABNORMAL LOW (ref 135–145)
Total Protein: 6 g/dL (ref 6.0–8.3)

## 2012-02-27 LAB — DIFFERENTIAL
Eosinophils Relative: 6 % — ABNORMAL HIGH (ref 0–5)
Lymphocytes Relative: 17 % (ref 12–46)
Lymphs Abs: 1.3 10*3/uL (ref 0.7–4.0)
Monocytes Absolute: 0.7 10*3/uL (ref 0.1–1.0)

## 2012-02-27 LAB — GLUCOSE, CAPILLARY: Glucose-Capillary: 237 mg/dL — ABNORMAL HIGH (ref 70–99)

## 2012-02-27 MED ORDER — SODIUM CHLORIDE 0.9 % IV SOLN
INTRAVENOUS | Status: DC
  Administered 2012-02-27 – 2012-03-02 (×3): via INTRAVENOUS

## 2012-02-27 MED ORDER — ACETAMINOPHEN 500 MG PO TABS: 1000.0000 mg | ORAL_TABLET | Freq: Two times a day (BID) | ORAL | Status: DC | PRN

## 2012-02-27 MED ORDER — DORZOLAMIDE HCL-TIMOLOL MAL 2-0.5 % OP SOLN
1.0000 [drp] | Freq: Two times a day (BID) | OPHTHALMIC | Status: DC
  Administered 2012-02-27 – 2012-03-27 (×57): 1 [drp] via OPHTHALMIC
  Filled 2012-02-27 (×3): qty 10

## 2012-02-27 MED ORDER — HEPARIN SODIUM (PORCINE) 5000 UNIT/ML IJ SOLN
5000.0000 [IU] | Freq: Three times a day (TID) | INTRAMUSCULAR | Status: DC
  Administered 2012-02-27 – 2012-02-28 (×3): 5000 [IU] via SUBCUTANEOUS
  Filled 2012-02-27 (×6): qty 1

## 2012-02-27 MED ORDER — INSULIN ASPART 100 UNIT/ML ~~LOC~~ SOLN
3.0000 [IU] | Freq: Three times a day (TID) | SUBCUTANEOUS | Status: DC
  Administered 2012-02-28: 3 [IU] via SUBCUTANEOUS
  Administered 2012-02-28 – 2012-02-29 (×2): 6 [IU] via SUBCUTANEOUS
  Administered 2012-03-01: 3 [IU] via SUBCUTANEOUS
  Administered 2012-03-01: 6 [IU] via SUBCUTANEOUS
  Administered 2012-03-01: 12 [IU] via SUBCUTANEOUS
  Administered 2012-03-02: 3 [IU] via SUBCUTANEOUS
  Administered 2012-03-02: 17:00:00 via SUBCUTANEOUS
  Administered 2012-03-02: 16 [IU] via SUBCUTANEOUS
  Administered 2012-03-03: 3 [IU] via SUBCUTANEOUS
  Administered 2012-03-03: 12 [IU] via SUBCUTANEOUS
  Administered 2012-03-03: 3 [IU] via SUBCUTANEOUS
  Administered 2012-03-04: 16 [IU] via SUBCUTANEOUS
  Administered 2012-03-04: 20 [IU] via SUBCUTANEOUS
  Administered 2012-03-04: 16 [IU] via SUBCUTANEOUS

## 2012-02-27 MED ORDER — IPRATROPIUM BROMIDE 0.03 % NA SOLN
2.0000 | Freq: Two times a day (BID) | NASAL | Status: DC
  Filled 2012-02-27 (×2): qty 30

## 2012-02-27 MED ORDER — BRIMONIDINE TARTRATE 0.15 % OP SOLN
1.0000 [drp] | Freq: Two times a day (BID) | OPHTHALMIC | Status: DC
  Filled 2012-02-27: qty 5

## 2012-02-27 MED ORDER — EZETIMIBE 10 MG PO TABS
10.0000 mg | ORAL_TABLET | Freq: Every day | ORAL | Status: DC
  Administered 2012-02-28 – 2012-03-27 (×25): 10 mg via ORAL
  Filled 2012-02-27 (×29): qty 1

## 2012-02-27 MED ORDER — ATORVASTATIN CALCIUM 80 MG PO TABS
80.0000 mg | ORAL_TABLET | Freq: Every day | ORAL | Status: DC
  Administered 2012-02-28 – 2012-03-14 (×15): 80 mg via ORAL
  Filled 2012-02-27 (×19): qty 1

## 2012-02-27 MED ORDER — VITAMIN D3 25 MCG (1000 UNIT) PO TABS
1000.0000 [IU] | ORAL_TABLET | Freq: Every day | ORAL | Status: DC
  Administered 2012-02-28 – 2012-03-04 (×6): 1000 [IU] via ORAL
  Filled 2012-02-27 (×8): qty 1

## 2012-02-27 MED ORDER — ALBUTEROL SULFATE HFA 108 (90 BASE) MCG/ACT IN AERS
1.0000 | INHALATION_SPRAY | Freq: Four times a day (QID) | RESPIRATORY_TRACT | Status: DC | PRN
  Administered 2012-03-03: 2 via RESPIRATORY_TRACT
  Filled 2012-02-27: qty 6.7

## 2012-02-27 MED ORDER — ASPIRIN 325 MG PO TABS
325.0000 mg | ORAL_TABLET | Freq: Every day | ORAL | Status: DC
  Administered 2012-02-28: 325 mg via ORAL
  Filled 2012-02-27 (×2): qty 1

## 2012-02-27 MED ORDER — POLYVINYL ALCOHOL 1.4 % OP SOLN
1.0000 [drp] | Freq: Two times a day (BID) | OPHTHALMIC | Status: DC
  Administered 2012-02-27 – 2012-03-27 (×57): 1 [drp] via OPHTHALMIC
  Filled 2012-02-27 (×3): qty 15

## 2012-02-27 MED ORDER — ERYTHROMYCIN 5 MG/GM OP OINT
1.0000 "application " | TOPICAL_OINTMENT | Freq: Two times a day (BID) | OPHTHALMIC | Status: DC
  Administered 2012-02-27 – 2012-03-27 (×57): 1 via OPHTHALMIC
  Filled 2012-02-27 (×3): qty 3.5

## 2012-02-27 MED ORDER — LOTEPREDNOL ETABONATE 0.5 % OP SUSP
1.0000 [drp] | Freq: Two times a day (BID) | OPHTHALMIC | Status: DC
  Administered 2012-02-27 – 2012-03-27 (×57): 1 [drp] via OPHTHALMIC
  Filled 2012-02-27 (×3): qty 5

## 2012-02-27 MED ORDER — CITALOPRAM HYDROBROMIDE 40 MG PO TABS
40.0000 mg | ORAL_TABLET | Freq: Every day | ORAL | Status: DC
  Administered 2012-02-28 – 2012-03-04 (×6): 40 mg via ORAL
  Filled 2012-02-27 (×8): qty 1

## 2012-02-27 MED ORDER — EXENATIDE 10 MCG/0.04ML ~~LOC~~ SOPN
10.0000 ug | PEN_INJECTOR | Freq: Two times a day (BID) | SUBCUTANEOUS | Status: DC
  Administered 2012-02-27 – 2012-03-04 (×13): 10 ug via SUBCUTANEOUS
  Filled 2012-02-27: qty 2.4

## 2012-02-27 MED ORDER — MIDODRINE HCL 2.5 MG PO TABS
2.5000 mg | ORAL_TABLET | Freq: Every day | ORAL | Status: DC
  Administered 2012-02-28 – 2012-03-04 (×6): 2.5 mg via ORAL
  Filled 2012-02-27 (×8): qty 1

## 2012-02-27 MED ORDER — FERROUS SULFATE 325 (65 FE) MG PO TABS
325.0000 mg | ORAL_TABLET | Freq: Two times a day (BID) | ORAL | Status: DC
  Administered 2012-02-27 – 2012-03-03 (×10): 325 mg via ORAL
  Filled 2012-02-27 (×17): qty 1

## 2012-02-27 MED ORDER — CLOPIDOGREL BISULFATE 75 MG PO TABS
75.0000 mg | ORAL_TABLET | Freq: Every day | ORAL | Status: DC
  Filled 2012-02-27 (×2): qty 1

## 2012-02-27 MED ORDER — BRIMONIDINE TARTRATE 0.2 % OP SOLN
1.0000 [drp] | Freq: Two times a day (BID) | OPHTHALMIC | Status: DC
  Administered 2012-02-27 – 2012-03-27 (×57): 1 [drp] via OPHTHALMIC
  Filled 2012-02-27 (×3): qty 5

## 2012-02-27 MED ORDER — PANTOPRAZOLE SODIUM 40 MG PO TBEC
40.0000 mg | DELAYED_RELEASE_TABLET | Freq: Every day | ORAL | Status: DC
  Administered 2012-02-28 – 2012-03-04 (×6): 40 mg via ORAL
  Filled 2012-02-27 (×6): qty 1

## 2012-02-27 MED ORDER — INSULIN ASPART 100 UNIT/ML ~~LOC~~ SOLN: 0.0000 [IU] | Freq: Three times a day (TID) | SUBCUTANEOUS | Status: DC

## 2012-02-27 MED ORDER — SODIUM CHLORIDE 0.9 % IJ SOLN
3.0000 mL | Freq: Two times a day (BID) | INTRAMUSCULAR | Status: DC
  Administered 2012-03-02: 3 mL via INTRAVENOUS
  Administered 2012-03-02: 12:00:00 via INTRAVENOUS
  Administered 2012-03-04: 3 mL via INTRAVENOUS

## 2012-02-27 MED ORDER — POLYETHYL GLYCOL-PROPYL GLYCOL 0.4-0.3 % OP SOLN
1.0000 [drp] | Freq: Two times a day (BID) | OPHTHALMIC | Status: DC
Start: 2012-02-27 — End: 2012-02-27

## 2012-02-27 MED ORDER — EZETIMIBE 10 MG PO TABS
10.0000 mg | ORAL_TABLET | Freq: Every day | ORAL | Status: DC
Start: 2012-02-27 — End: 2012-02-27
  Filled 2012-02-27: qty 1

## 2012-02-27 MED ORDER — INSULIN GLARGINE 100 UNIT/ML ~~LOC~~ SOLN
45.0000 [IU] | Freq: Two times a day (BID) | SUBCUTANEOUS | Status: DC
  Administered 2012-02-27 – 2012-03-04 (×12): 45 [IU] via SUBCUTANEOUS

## 2012-02-27 MED ORDER — RISPERIDONE 2 MG PO TABS
2.0000 mg | ORAL_TABLET | Freq: Every day | ORAL | Status: DC
  Administered 2012-02-27 – 2012-03-04 (×7): 2 mg via ORAL
  Filled 2012-02-27 (×10): qty 1

## 2012-02-27 NOTE — Progress Notes (Signed)
Noticed in MD's note that pt will have cath tomorrow. However, no current orders for consent or cath orders. There are old cath orders from Aug that are signed and held. Notified on call MD. Was told to make pt NPO after midnight and orders will be placed in the morning for pt's cath by the MD.

## 2012-02-28 ENCOUNTER — Other Ambulatory Visit: Payer: Self-pay | Admitting: *Deleted

## 2012-02-28 ENCOUNTER — Encounter (HOSPITAL_COMMUNITY)
Admission: AD | Disposition: A | Discharge status: Skilled Nursing Facility | Payer: Self-pay | Source: Ambulatory Visit | Attending: Cardiothoracic Surgery

## 2012-02-28 DIAGNOSIS — N183 Chronic kidney disease, stage 3 unspecified: Secondary | ICD-10-CM | POA: Diagnosis present

## 2012-02-28 DIAGNOSIS — E119 Type 2 diabetes mellitus without complications: Secondary | ICD-10-CM | POA: Diagnosis present

## 2012-02-28 DIAGNOSIS — I2510 Atherosclerotic heart disease of native coronary artery without angina pectoris: Secondary | ICD-10-CM | POA: Diagnosis present

## 2012-02-28 DIAGNOSIS — H543 Unqualified visual loss, both eyes: Secondary | ICD-10-CM | POA: Diagnosis present

## 2012-02-28 DIAGNOSIS — H9190 Unspecified hearing loss, unspecified ear: Secondary | ICD-10-CM | POA: Diagnosis present

## 2012-02-28 DIAGNOSIS — J449 Chronic obstructive pulmonary disease, unspecified: Secondary | ICD-10-CM | POA: Diagnosis present

## 2012-02-28 HISTORY — PX: CARDIAC CATHETERIZATION: SHX172

## 2012-02-28 HISTORY — PX: LEFT HEART CATHETERIZATION WITH CORONARY ANGIOGRAM: SHX5451

## 2012-02-28 LAB — BASIC METABOLIC PANEL
CO2: 26 mEq/L (ref 19–32)
Calcium: 9 mg/dL (ref 8.4–10.5)
Chloride: 101 mEq/L (ref 96–112)
Glucose, Bld: 215 mg/dL — ABNORMAL HIGH (ref 70–99)
Sodium: 134 mEq/L — ABNORMAL LOW (ref 135–145)

## 2012-02-28 LAB — GLUCOSE, CAPILLARY
Glucose-Capillary: 194 mg/dL — ABNORMAL HIGH (ref 70–99)
Glucose-Capillary: 160 mg/dL — ABNORMAL HIGH (ref 70–99)

## 2012-02-28 SURGERY — LEFT HEART CATHETERIZATION WITH CORONARY ANGIOGRAM
Anesthesia: LOCAL

## 2012-02-28 MED ORDER — SODIUM CHLORIDE 0.9 % IJ SOLN: 3.0000 mL | Freq: Two times a day (BID) | INTRAMUSCULAR | Status: DC

## 2012-02-28 MED ORDER — NITROGLYCERIN 0.2 MG/ML ON CALL CATH LAB
INTRAVENOUS | Status: AC
  Filled 2012-02-28: qty 1

## 2012-02-28 MED ORDER — SODIUM CHLORIDE 0.9 % IJ SOLN: 3.0000 mL | INTRAMUSCULAR | Status: DC | PRN

## 2012-02-28 MED ORDER — HEPARIN (PORCINE) IN NACL 100-0.45 UNIT/ML-% IJ SOLN
1900.0000 [IU]/h | INTRAMUSCULAR | Status: DC
  Administered 2012-02-28: 1300 [IU]/h via INTRAVENOUS
  Administered 2012-02-29: 1600 [IU]/h via INTRAVENOUS
  Administered 2012-02-29: 1700 [IU]/h via INTRAVENOUS
  Administered 2012-03-01 – 2012-03-02 (×2): 1600 [IU]/h via INTRAVENOUS
  Administered 2012-03-02: 1700 [IU]/h via INTRAVENOUS
  Administered 2012-03-03 – 2012-03-04 (×2): 1900 [IU]/h via INTRAVENOUS
  Filled 2012-02-28 (×11): qty 250

## 2012-02-28 MED ORDER — SODIUM CHLORIDE 0.9 % IV SOLN: 1.0000 mL/kg/h | INTRAVENOUS | Status: AC

## 2012-02-28 MED ORDER — ASPIRIN 81 MG PO CHEW: 324.0000 mg | CHEWABLE_TABLET | ORAL | Status: AC

## 2012-02-28 MED ORDER — HEPARIN (PORCINE) IN NACL 2-0.9 UNIT/ML-% IJ SOLN
INTRAMUSCULAR | Status: AC
  Filled 2012-02-28: qty 2000

## 2012-02-28 MED ORDER — NITROGLYCERIN IN D5W 200-5 MCG/ML-% IV SOLN
INTRAVENOUS | Status: AC
  Filled 2012-02-28: qty 250

## 2012-02-28 MED ORDER — IPRATROPIUM BROMIDE 0.06 % NA SOLN
1.0000 | Freq: Two times a day (BID) | NASAL | Status: DC
  Administered 2012-02-28 – 2012-03-27 (×44): 1 via NASAL
  Filled 2012-02-28 (×2): qty 15

## 2012-02-28 MED ORDER — ASPIRIN 81 MG PO CHEW
81.0000 mg | CHEWABLE_TABLET | Freq: Every day | ORAL | Status: DC
  Administered 2012-02-29 – 2012-03-04 (×5): 81 mg via ORAL
  Filled 2012-02-28 (×5): qty 1

## 2012-02-28 MED ORDER — SODIUM CHLORIDE 0.9 % IV SOLN: INTRAVENOUS | Status: DC

## 2012-02-28 MED ORDER — DIAZEPAM 5 MG PO TABS
5.0000 mg | ORAL_TABLET | ORAL | Status: AC
  Administered 2012-02-28: 5 mg via ORAL
  Filled 2012-02-28: qty 1

## 2012-02-28 MED ORDER — LIDOCAINE HCL (PF) 1 % IJ SOLN
INTRAMUSCULAR | Status: AC
  Filled 2012-02-28: qty 30

## 2012-02-28 MED ORDER — ONDANSETRON HCL 4 MG/2ML IJ SOLN
4.0000 mg | Freq: Four times a day (QID) | INTRAMUSCULAR | Status: DC | PRN
  Administered 2012-02-28 – 2012-03-05 (×4): 4 mg via INTRAVENOUS
  Filled 2012-02-28 (×4): qty 2

## 2012-02-28 MED ORDER — SODIUM CHLORIDE 0.9 % IV SOLN: 250.0000 mL | INTRAVENOUS | Status: DC | PRN

## 2012-02-28 MED ORDER — ACETAMINOPHEN 325 MG PO TABS
650.0000 mg | ORAL_TABLET | ORAL | Status: DC | PRN
  Administered 2012-02-28 – 2012-03-04 (×10): 650 mg via ORAL
  Filled 2012-02-28 (×10): qty 2

## 2012-02-28 MED ORDER — NITROGLYCERIN IN D5W 200-5 MCG/ML-% IV SOLN
2.0000 ug/min | INTRAVENOUS | Status: DC
  Administered 2012-03-02: 10 ug/min via INTRAVENOUS
  Filled 2012-02-28 (×2): qty 250
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
## 2012-02-28 NOTE — Interval H&P Note (Signed)
History and Physical Interval Note:  02/28/2012 12:08 PM  [name]  has presented today for surgery, with the diagnosis of cad  The various methods of treatment have been discussed with the patient and family. After consideration of risks, benefits and other options for treatment, the patient has consented to  Procedure(s) (LRB) with comments: LEFT HEART CATHETERIZATION WITH CORONARY ANGIOGRAM (N/A) as a surgical intervention .  The patient's history has been reviewed, patient examined, no change in status, stable for surgery.  I have reviewed the patient's chart and labs.  Questions were answered to the patient's satisfaction.     TURNER,TRACI R

## 2012-02-28 NOTE — Care Management Note (Signed)
    Page 1 of 2   03/27/2012     4:50:53 PM   CARE MANAGEMENT NOTE 03/27/2012  Patient:  [name],[name]   Account Number:  [id number]  Date Initiated:  02/28/2012  Documentation initiated by:  [facility]  Subjective/Objective Assessment:   Hydration prior to cath for renal coverage.  Has spouse.     Action/Plan:   PT/OT   Anticipated DC Date:  03/27/2012   Anticipated DC Plan:  SKILLED NURSING FACILITY  In-house referral  Clinical Social Worker      DC Planning Services  CM consult      Choice offered to / List presented to:             Status of service:  Completed, signed off Medicare Important Message given?   (If response is "NO", the following Medicare IM given date fields will be blank) Date Medicare IM given:   Date Additional Medicare IM given:    Discharge Disposition:  SKILLED NURSING FACILITY  Per UR Regulation:  Reviewed for med. necessity/level of care/duration of stay  If discussed at Long Length of Stay Meetings, dates discussed:   03/20/2012    Comments:  Contact:  [name],[name] Spouse [phone]  03/27/12- 1400- [name] RN, BSN [phone] Pt for d/c to SNF today, CSW following for placement needs.  03/13/12 James AMERSON,RN,BSN PT WITH POOR PROGRESS; NOW WITH FEVER AND INCREASED WBCS. MD REQUESTED POSSIBLE LTAC CONSULT, BUT PT'S INSURANCE PROVIDES NO LTAC BENEFIT.  WILL ULTIMATELY NEED SNF AT DC WHEN MEDICALLY STABLE.  WILL FOLLOW.  CSW HAS GIVEN BED OFFERS TO FAMILY.  03/06/12 James AMERSON,RN,BSN 1330 PT S/P CABG X 4 ON 03/05/12.  PTA, PT LIVES WITH SPOUSE; HE IS BLIND IN RT EYE, AND QUITE HOH.  HE USES CANE TO AMBULATE.  PHYSICAL THERAPY CONSULT COMPLETED TODAY, RECOMMENDATION IS FOR SNF FOR SHORT TERM REHAB.  MET WITH PT, SON AND WIFE TO DISCUSS DC PLANS.  THEY ARE ALL AGREEABLE TO P.T.'S RECOMMENDATION, AS WIFE HAS BACK TROUBLE AND CANNOT LIFT PATIENT.  THEY ARE INTERESTED IN [facility] OR [place] SNF, POSSIBLY [facility], AS DISPOSITION.  CSW  CONSULTED TO FACILITATE DC TO SNF WHEN MEDICALLY STABLE.  WILL FOLLOW.   02-28-12 10am [name], RNBSN [phone] Patient is blind and HOH - spouse caregiver.  Walks with cane.

## 2012-02-28 NOTE — Progress Notes (Signed)
SUBJECTIVE:  Doing well with no compliants  OBJECTIVE:   Vitals:   Filed Vitals:   02/27/12 2024 02/27/12 2100 02/28/12 0439 02/28/12 0916  BP: 176/79 162/64 149/85   Pulse: 77  75   Temp: 97.6 F (36.4 C)  98.2 F (36.8 C)   TempSrc: Oral  Oral   Resp: 18  19   Height:      Weight:    115.9 kg (255 lb 8.2 oz)  SpO2: 97%  95%    I&O's:   Intake/Output Summary (Last 24 hours) at 02/28/12 1054 Last data filed at 02/27/12 2238  Gross per 24 hour  Intake    960 ml  Output      0 ml  Net    960 ml   TELEMETRY: Reviewed telemetry pt in NSR:     PHYSICAL EXAM General: Well developed, well nourished, in no acute distress Head: Eyes PERRLA, No xanthomas.   Normal cephalic and atramatic  Lungs:   Clear bilaterally to auscultation and percussion. Heart:   HRRR S1 S2 Pulses are 2+ & equal. Abdomen: Bowel sounds are positive, abdomen soft and non-tender without masses Extremities:   No clubbing, cyanosis or edema.  DP +1 Neuro: Alert and oriented X 3. Psych:  Good affect, responds appropriately   LABS: Basic Metabolic Panel:  Basename 02/28/12 0438 02/27/12 1519  NA 134* 130*  K 4.3 4.4  CL 101 96  CO2 26 26  GLUCOSE 215* 72  BUN 19 20  CREATININE 1.56* 1.54*  CALCIUM 9.0 9.7  MG -- 2.0  PHOS -- 3.4   Liver Function Tests:  Basename 02/27/12 1519  AST 24  ALT 27  ALKPHOS 54  BILITOT 0.3  PROT 6.0  ALBUMIN 3.5   No results found for this basename: LIPASE:2,AMYLASE:2 in the last 72 hours CBC:  Basename 02/27/12 1519  WBC 7.4  NEUTROABS 5.0  HGB 11.8*  HCT 34.1*  MCV 91.4  PLT 132*    Coag Panel:   Lab Results  Component Value Date   INR 1.01 02/27/2012    RADIOLOGY: Dg Chest 2 View  02/27/2012  *RADIOLOGY REPORT*  Clinical Data: Preop  CHEST - 2 VIEW  Comparison: 08/21/2007  Findings: Normal heart size.  Lungs are under aerated with bibasilar atelectasis.  No pneumothorax and no pleural effusion.  IMPRESSION: Bibasilar atelectasis.   Original Report  Authenticated By: Donavan Burnet, M.D.       ASSESSMENT:  1.  Angina with recent nuclear stress test showing ischemia 2.  CAD s/p NSTEMI 10/2004 with PCI to LCX o/w 50-60% diagonal, 60% LAD, 40% distal RCA 3.  DM 4,  CKD stage 3 5.  HTN 6.  COPD 7.  GERD 8.  Mild ischemic dilated cardiomyopathy EF 40-45% at cath 2006  PLAN:   1.  Cardiac cath today ([facility] only due to renal failure).Cardiac catheterization was discussed with the patient fully including risks on myocardial infarction, death, stroke, bleeding, arrhythmia, dye allergy, renal insufficiency or bleeding.  All patient questions and concerns were discussed and the patient understands and is willing to proceed.   2.  Continue IVF hydration    Quintella Reichert, MD  02/28/2012  10:54 AM

## 2012-02-28 NOTE — CV Procedure (Signed)
PROCEDURE:  Left heart catheterization with selective coronary angiography, left ventriculogram.  INDICATIONS:    The risks, benefits, and details of the procedure were explained to the patient.  The patient verbalized understanding and wanted to proceed.  Informed written consent was obtained.  PROCEDURE TECHNIQUE:  After Xylocaine anesthesia a 70F sheath was placed in the right femoral artery with a single anterior needle wall stick.   Left coronary angiography was done using a Judkins L4 guide catheter.  Right coronary angiography was done using a Judkins R4 guide catheter.  Left ventriculography was not done due to renal failure.    CONTRAST:  Total of  60cc.  COMPLICATIONS:  None.    HEMODYNAMICS:  Aortic pressure was 145/9mmHg; LV pressure was 144/37mmHg; LVEDP .    ANGIOGRAPHIC DATA:   The left main coronary artery is calcified with an 80% stenosis of the distal vessel.  The left anterior descending artery is calcified proximally and gives rise to a septal perforator.  The LAD then gives rise to a first diagonal which has an ostial 95% stenosis.  The mid diagonal has an 80% stenosis.  At the takeoff of the diagonal the LAD is diffusely diseased up to 70%.  The ongoing LAD is patent and gives rise to a second diagonal which is patent.    The left circumflex artery is has an ostial 30-40% stenosis and then gives rise to a small OM1 and then a  large OM2 branch.  There is a bifurcation lesion at the takeoff of the OM2 from the left circumflex.  The left circumflex stent appears to have a high grade filling defect after the takeoff of the OM2 but does fill the distal left circumflex.  The OM2 is large and has a 95% ostial stenosis.  The ongoing OM2 is patent and bifurcates into 2 daughter vessels which are patent.    The right coronary artery is diffusely diseased but no obstructive lesions are noted.   IMPRESSIONS:  1. 80% distal left main stenosis 2. 95% ostial first diagonal  stenosis, 70% mid LAD stenosis at takeoff of diagonal. 3. High grade filling defect in mid left circ at bifurcation of large OM2 with 95% ostial OM2 lesion 4. Nonobstructive ASCAD of RCA.  RECOMMENDATION:   1.  CVTS consult for CABG 2.  Start IV Heparin gtt 3.  IV NTG gtt 4.  Continue ASA/statin 5.  Stop Plavix
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
## 2012-02-28 NOTE — Progress Notes (Signed)
ANTICOAGULATION CONSULT NOTE - Initial Consult  Pharmacy Consult for heparin Indication: chest pain/ACS  No Known Allergies  Patient Measurements: Height: 5\' 10"  (177.8 cm) Weight: 255 lb 8.2 oz (115.9 kg) IBW/kg (Calculated) : 73  Heparin Dosing Weight: 98kg  Vital Signs: Temp: 98.2 F (36.8 C) (09/10 0439) Temp src: Oral (09/10 0439) BP: 149/85 mmHg (09/10 0439) Pulse Rate: 75  (09/10 0439)  Labs:  Basename 02/28/12 0438 02/27/12 1519  HGB -- 11.8*  HCT -- 34.1*  PLT -- 132*  APTT -- 30  LABPROT -- 13.5  INR -- 1.01  HEPARINUNFRC -- --  CREATININE 1.56* 1.54*  CKTOTAL -- --  CKMB -- --  TROPONINI -- --    Estimated Creatinine Clearance: 53.8 ml/min (by C-G formula based on Cr of 1.56).   Medical History: Past Medical History  Diagnosis Date  . Myocardial infarction   . Shortness of breath   . Chronic kidney disease     stage 3  . Hypertension   . Depression   . COPD (chronic obstructive pulmonary disease)   . Cardiomyopathy   . GERD (gastroesophageal reflux disease)   . Headache   . Arthritis   . Lipoma   . Hearing loss   . Gastroparesis   . Hemorrhoids   . Colon polyps   . Diabetes mellitus     insulin dependent with neuropathy, nephropathy, retinopathy  . Blindness of right eye   . Coronary artery disease     NSTEMI 2006 with PCI DES left circ 11/18/2004 with residual 50-60% diagonal, 60% LAD, 40% distal RCA  . CHF (congestive heart failure)     mild ischemic DCM EF 40-45% by cath 2006  . Obesity   Assessment: 71 year old male admitted for LHC after having abnormal stress test. Patient was found to have severe multivessel CAD. Consult to CVTS is pending to review for possible CABG will start heparin 8 hours post sheath removal ~1400. Patient was on plavix pta, which has now been stopped.  Goal of Therapy:  Heparin level 0.3-0.7 units/ml Monitor platelets by anticoagulation protocol: Yes   Plan:  Start heparin infusion at 1300 units/hr Check  anti-Xa level in 8 hours and daily while on heparin Continue to monitor H&H and platelets  Severiano Gilbert 02/28/2012,2:20 PM

## 2012-02-29 ENCOUNTER — Inpatient Hospital Stay (HOSPITAL_COMMUNITY): Payer: Medicare Other

## 2012-02-29 DIAGNOSIS — Z01810 Encounter for preprocedural cardiovascular examination: Secondary | ICD-10-CM

## 2012-02-29 LAB — CBC
HCT: 32 % — ABNORMAL LOW (ref 39.0–52.0)
Hemoglobin: 10.7 g/dL — ABNORMAL LOW (ref 13.0–17.0)
MCH: 31.1 pg (ref 26.0–34.0)
MCHC: 33.4 g/dL (ref 30.0–36.0)
MCV: 93 fL (ref 78.0–100.0)
RDW: 12.2 % (ref 11.5–15.5)

## 2012-02-29 LAB — GLUCOSE, CAPILLARY
Glucose-Capillary: 185 mg/dL — ABNORMAL HIGH (ref 70–99)
Glucose-Capillary: 51 mg/dL — ABNORMAL LOW (ref 70–99)

## 2012-02-29 LAB — HEPARIN LEVEL (UNFRACTIONATED): Heparin Unfractionated: <0.1 IU/mL — ABNORMAL LOW (ref 0.30–0.70)

## 2012-02-29 MED ORDER — ALBUTEROL SULFATE (5 MG/ML) 0.5% IN NEBU
2.5000 mg | INHALATION_SOLUTION | Freq: Once | RESPIRATORY_TRACT | Status: AC
  Administered 2012-02-29: 2.5 mg via RESPIRATORY_TRACT

## 2012-02-29 MED ORDER — DEXTROSE 50 % IV SOLN
INTRAVENOUS | Status: AC
  Administered 2012-02-29: 25 mL
  Filled 2012-02-29: qty 50

## 2012-02-29 NOTE — Progress Notes (Signed)
SUBJECTIVE:  Doing well with no compliants.  No bleeding issues in the right groin.  OBJECTIVE:   Vitals:   Filed Vitals:   02/28/12 1958 02/28/12 2334 02/29/12 0403 02/29/12 0734  BP: 121/56 114/51 125/61 125/68  Pulse: 67     Temp: 97.7 F (36.5 C) 98.8 F (37.1 C) 99.1 F (37.3 C)   TempSrc: Oral Oral Oral   Resp: 19 16 16    Height:      Weight:      SpO2:  97% 94%    I&O's:    Intake/Output Summary (Last 24 hours) at 02/29/12 1149 Last data filed at 02/29/12 0900  Gross per 24 hour  Intake 752.53 ml  Output    750 ml  Net   2.53 ml   TELEMETRY: Reviewed telemetry pt in NSR:     PHYSICAL EXAM General: Well developed, well nourished, in no acute distress Head: Eyes PERRLA, No xanthomas.   Normal cephalic and atramatic  Lungs:   Clear bilaterally to auscultation and percussion. Heart:   HRRR S1 S2 Pulses are 2+ & equal. Abdomen: Bowel sounds are positive, abdomen soft and non-tender without masses  Extremities:   No right groin hematoma.  Right PT 2+ Neuro: Alert and oriented X 3. Psych:  Normal affect, responds appropriately   LABS: Basic Metabolic Panel:  Basename 02/28/12 0438 02/27/12 1519  NA 134* 130*  K 4.3 4.4  CL 101 96  CO2 26 26  GLUCOSE 215* 72  BUN 19 20  CREATININE 1.56* 1.54*  CALCIUM 9.0 9.7  MG -- 2.0  PHOS -- 3.4   Liver Function Tests:  Basename 02/27/12 1519  AST 24  ALT 27  ALKPHOS 54  BILITOT 0.3  PROT 6.0  ALBUMIN 3.5   No results found for this basename: LIPASE:2,AMYLASE:2 in the last 72 hours CBC:  Basename 02/29/12 0522 02/27/12 1519  WBC 7.4 7.4  NEUTROABS -- 5.0  HGB 10.7* 11.8*  HCT 32.0* 34.1*  MCV 93.0 91.4  PLT 125* 132*    Coag Panel:   Lab Results  Component Value Date   INR 1.01 02/27/2012    RADIOLOGY: Dg Chest 2 View  02/27/2012  *RADIOLOGY REPORT*  Clinical Data: Preop  CHEST - 2 VIEW  Comparison: 08/21/2007  Findings: Normal heart size.  Lungs are under aerated with bibasilar atelectasis.  No  pneumothorax and no pleural effusion.  IMPRESSION: Bibasilar atelectasis.   Original Report Authenticated By: Donavan Burnet, M.D.       ASSESSMENT:  1.  severe coronary artery disease, chronic kidney disease  PLAN:   Plan for CABG next week after Plavix is worn off.  Continue aggressive secondary prevention.  Continue to follow renal function.  Dye usage was minimized at the time of catheterization to preserve renal function.  Blood pressure well controlled.    Corky Crafts., MD  02/29/2012  11:49 AM

## 2012-02-29 NOTE — Progress Notes (Signed)
ANTICOAGULATION CONSULT NOTE   Pharmacy Consult for heparin Indication: chest pain/ACS  No Known Allergies  Patient Measurements: Height: 5\' 10"  (177.8 cm) Weight: 255 lb 8.2 oz (115.9 kg) IBW/kg (Calculated) : 73  Heparin Dosing Weight: 98kg  Vital Signs: Temp: 99.1 F (37.3 C) (09/11 0403) Temp src: Oral (09/11 0403) BP: 125/61 mmHg (09/11 0403) Pulse Rate: 67  (09/10 1958)  Labs:  Basename 02/29/12 0522 02/28/12 0438 02/27/12 1519  HGB 10.7* -- 11.8*  HCT 32.0* -- 34.1*  PLT 125* -- 132*  APTT -- -- 30  LABPROT -- -- 13.5  INR -- -- 1.01  HEPARINUNFRC <0.10* -- --  CREATININE -- 1.56* 1.54*  CKTOTAL -- -- --  CKMB -- -- --  TROPONINI -- -- --    Estimated Creatinine Clearance: 53.8 ml/min (by C-G formula based on Cr of 1.56).  Assessment: 71 year old male with CAD awaiting CABG for Heparin  Goal of Therapy:  Heparin level 0.3-0.7 units/ml Monitor platelets by anticoagulation protocol: Yes   Plan:  Increase Heparin  1700 units/hr Check heparin level in 8 hours.   Eddie Candle 02/29/2012,6:45 AM

## 2012-02-29 NOTE — Progress Notes (Signed)
ANTICOAGULATION CONSULT NOTE - Initial Consult  Pharmacy Consult for heparin Indication: chest pain/ACS  No Known Allergies  Patient Measurements: Height: 5\' 10"  (177.8 cm) Weight: 255 lb 8.2 oz (115.9 kg) IBW/kg (Calculated) : 73  Heparin Dosing Weight: 98kg  Vital Signs: Temp: 98 F (36.7 C) (09/11 1200) Temp src: Oral (09/11 1200) BP: 130/80 mmHg (09/11 1200) Pulse Rate: 83  (09/11 1200)  Labs:  Basename 02/29/12 1416 02/29/12 0522 02/28/12 0438 02/27/12 1519  HGB -- 10.7* -- 11.8*  HCT -- 32.0* -- 34.1*  PLT -- 125* -- 132*  APTT -- -- -- 30  LABPROT -- -- -- 13.5  INR -- -- -- 1.01  HEPARINUNFRC 0.73* <0.10* -- --  CREATININE -- -- 1.56* 1.54*  CKTOTAL -- -- -- --  CKMB -- -- -- --  TROPONINI -- -- -- --    Estimated Creatinine Clearance: 53.8 ml/min (by C-G formula based on Cr of 1.56).   Medical History: Past Medical History  Diagnosis Date  . Myocardial infarction   . Shortness of breath   . Chronic kidney disease     stage 3  . Hypertension   . Depression   . COPD (chronic obstructive pulmonary disease)   . Cardiomyopathy   . GERD (gastroesophageal reflux disease)   . Headache   . Arthritis   . Lipoma   . Hearing loss   . Gastroparesis   . Hemorrhoids   . Colon polyps   . Diabetes mellitus     insulin dependent with neuropathy, nephropathy, retinopathy  . Blindness of right eye   . Coronary artery disease     NSTEMI 2006 with PCI DES left circ 11/18/2004 with residual 50-60% diagonal, 60% LAD, 40% distal RCA  . CHF (congestive heart failure)     mild ischemic DCM EF 40-45% by cath 2006  . Obesity   Assessment: 71 year old male admitted for LHC after having abnormal stress test. Patient was found to have severe multivessel CAD. Planning for CABG next week. Patient was on plavix pta, which has now been stopped. Currently on heparin 1700units/hr. HL slightly supratherapeutic at 0.73.   Goal of Therapy:  Heparin level 0.3-0.7 units/ml Monitor  platelets by anticoagulation protocol: Yes   Plan:  - Decrease heparin rate to 1600units/hr - Obtain 8hr heparin level  - Continue to monitor H&H and platelets   Thank you, Franchot Erichsen, Pharm.D. Clinical Pharmacist   Pager: [phone] 02/29/2012 4:08 PM

## 2012-02-29 NOTE — Progress Notes (Signed)
PFT completed at bedside. Unconfirmed copy placed in Shadow Chart. 

## 2012-02-29 NOTE — Progress Notes (Addendum)
VASCULAR LAB PRELIMINARY  PRELIMINARY  PRELIMINARY  PRELIMINARY  Pre-op Cardiac Surgery  Carotid Findings:  Bilaterally no significant ICA stenosis with antegrade vertebral flow.  Upper Extremity Right Left  Brachial Pressures 126 Triphasic 138 Triphasic  Radial Waveforms Triphasic Triphasic  Ulnar Waveforms Triphasic Triphasic  Palmar Arch (Allen's Test) Abnormal Abnormal   Findings:  Doppler waveforms remained normal with radial compression and diminished 50% with ulnar compression bilaterally.    Lower  Extremity Right Left  Dorsalis Pedis >300 Triphasic >300 Triphasic      Posterior Tibial >300 Biphasic 217 Biphasic  Ankle/Brachial Indices N/A N/A    Findings:  ABIs not ascertained due to incompressible vessels probably secondary to calcification. Doppler waveforms are within normal limits bilaterally at rest.  [name], RDMS, RVT SLAUGHTER, [place], RVS  02/29/2012, 2:48 PM

## 2012-03-01 LAB — BASIC METABOLIC PANEL
BUN: 17 mg/dL (ref 6–23)
CO2: 23 mEq/L (ref 19–32)
Chloride: 101 mEq/L (ref 96–112)
Creatinine, Ser: 1.58 mg/dL — ABNORMAL HIGH (ref 0.50–1.35)

## 2012-03-01 LAB — PLATELET INHIBITION P2Y12: Platelet Function  P2Y12: 321 [PRU] (ref 194–418)

## 2012-03-01 LAB — CBC
Hemoglobin: 10.9 g/dL — ABNORMAL LOW (ref 13.0–17.0)
MCH: 31.5 pg (ref 26.0–34.0)
MCV: 92.8 fL (ref 78.0–100.0)
RBC: 3.46 MIL/uL — ABNORMAL LOW (ref 4.22–5.81)

## 2012-03-01 LAB — GLUCOSE, CAPILLARY
Glucose-Capillary: 190 mg/dL — ABNORMAL HIGH (ref 70–99)
Glucose-Capillary: 209 mg/dL — ABNORMAL HIGH (ref 70–99)

## 2012-03-01 NOTE — Progress Notes (Signed)
ANTICOAGULATION CONSULT NOTE - Follow Up Consult  Pharmacy Consult for heparin Indication: chest pain/ACS  No Known Allergies  Patient Measurements: Height: 5\' 10"  (177.8 cm) Weight: 255 lb 8.2 oz (115.9 kg) IBW/kg (Calculated) : 73  Heparin Dosing Weight: 98 kg  Vital Signs: Temp: 98.6 F (37 C) (09/12 0833) Temp src: Oral (09/12 0833) BP: 139/65 mmHg (09/12 0833) Pulse Rate: 74  (09/12 0833)  Labs:  Basename 03/01/12 0420 03/01/12 0029 02/29/12 1416 02/29/12 0522 02/28/12 0438 02/27/12 1519  HGB 10.9* -- -- 10.7* -- --  HCT 32.1* -- -- 32.0* -- 34.1*  PLT 120* -- -- 125* -- 132*  APTT -- -- -- -- -- 30  LABPROT -- -- -- -- -- 13.5  INR -- -- -- -- -- 1.01  HEPARINUNFRC 0.59 0.56 0.73* -- -- --  CREATININE -- -- -- -- 1.56* 1.54*  CKTOTAL -- -- -- -- -- --  CKMB -- -- -- -- -- --  TROPONINI -- -- -- -- -- --    Estimated Creatinine Clearance: 53.8 ml/min (by C-G formula based on Cr of 1.56).   Medications:  Scheduled:    . albuterol  2.5 mg Nebulization Once  . aspirin  81 mg Oral Daily  . atorvastatin  80 mg Oral q1800  . brimonidine  1 drop Left Eye BID  . cholecalciferol  1,000 Units Oral Daily  . citalopram  40 mg Oral Daily  . dextrose      . dorzolamide-timolol  1 drop Left Eye BID  . erythromycin  1 application Right Eye BID  . exenatide  10 mcg Subcutaneous BID WC  . ezetimibe  10 mg Oral Daily  . ferrous sulfate  325 mg Oral BID WC  . insulin aspart  3-20 Units Subcutaneous TID WC  . insulin glargine  45 Units Subcutaneous BID  . ipratropium  1 spray Nasal BID  . loteprednol  1 drop Right Eye BID  . midodrine  2.5 mg Oral Daily  . pantoprazole  40 mg Oral Daily  . polyvinyl alcohol  1 drop Both Eyes BID  . risperiDONE  2 mg Oral QHS  . 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 Plan for CABG next week after plavix washout. Rx to dose heparin, HL  0.59 this AM  (9/12). CBC stable, slight decr in platelets, no bleeding noted.  Goal of Therapy:  Heparin level 0.3-0.7 units/ml Monitor platelets by anticoagulation protocol: Yes   Plan:  -Continue heparin rate of 1600 units/hour -Daily heparin level and CBC -Monitor for s/sx bleeding  Bola A. Wandra Feinstein D Clinical Pharmacist [phone] Phone [phone] 03/01/2012 9:21 AM

## 2012-03-01 NOTE — Progress Notes (Signed)
Inpatient Diabetes Program Recommendations  AACE/ADA: New Consensus Statement on Inpatient Glycemic Control (2013)  Target Ranges:  Prepandial:   less than 140 mg/dL      Peak postprandial:   less than 180 mg/dL (1-2 hours)      Critically ill patients:  140 - 180 mg/dL   HYPOglycemia last evening CBG = 51, 55  Patient did not receive evening dose of Lantus.  Fasting CBG = 149 this morning.   Inpatient Diabetes Program Recommendations Insulin - Basal: Decrease Lantus to 45 units once daily  Can slowly titrate back up if CBGs start to rise.    Thank you  Gregori Abril RN,BSN,CDE Inpatient Diabetes Coordinator [phone] (team pager)

## 2012-03-01 NOTE — Progress Notes (Signed)
SUBJECTIVE:  Denies any chest pain or SOB  OBJECTIVE:   Vitals:   Filed Vitals:   03/01/12 0003 03/01/12 0340 03/01/12 0349 03/01/12 0833  BP:  138/73  139/65  Pulse:    74  Temp: 99.1 F (37.3 C)  98.3 F (36.8 C) 98.6 F (37 C)  TempSrc: Oral  Oral Oral  Resp:  16    Height:      Weight:      SpO2: 95%  97% 97%   I&O's:   Intake/Output Summary (Last 24 hours) at 03/01/12 1059 Last data filed at 03/01/12 1000  Gross per 24 hour  Intake    933 ml  Output   1950 ml  Net  -1017 ml   TELEMETRY: Reviewed telemetry pt in NSR     PHYSICAL EXAM General: Well developed, well nourished, in no acute distress Head: Eyes PERRLA, No xanthomas.   Normal cephalic and atramatic  Lungs:   Clear bilaterally to auscultation and percussion. Heart:   HRRR S1 S2 Pulses are 2+ & equal. Abdomen: Bowel sounds are positive, abdomen soft and non-tender without masses  Extremities:   No clubbing, cyanosis or edema.  DP +1 Neuro: Alert and oriented X 3. Psych:  Good affect, responds appropriately   LABS: Basic Metabolic Panel:  Basename 02/28/12 0438 02/27/12 1519  NA 134* 130*  K 4.3 4.4  CL 101 96  CO2 26 26  GLUCOSE 215* 72  BUN 19 20  CREATININE 1.56* 1.54*  CALCIUM 9.0 9.7  MG -- 2.0  PHOS -- 3.4   Liver Function Tests:  Basename 02/27/12 1519  AST 24  ALT 27  ALKPHOS 54  BILITOT 0.3  PROT 6.0  ALBUMIN 3.5   No results found for this basename: LIPASE:2,AMYLASE:2 in the last 72 hours CBC:  Basename 03/01/12 0420 02/29/12 0522 02/27/12 1519  WBC 7.2 7.4 --  NEUTROABS -- -- 5.0  HGB 10.9* 10.7* --  HCT 32.1* 32.0* --  MCV 92.8 93.0 --  PLT 120* 125* --   Coag Panel:   Lab Results  Component Value Date   INR 1.01 02/27/2012    RADIOLOGY: Dg Chest 2 View  02/27/2012  *RADIOLOGY REPORT*  Clinical Data: Preop  CHEST - 2 VIEW  Comparison: 08/21/2007  Findings: Normal heart size.  Lungs are under aerated with bibasilar atelectasis.  No pneumothorax and no pleural  effusion.  IMPRESSION: Bibasilar atelectasis.   Original Report Authenticated By: Donavan Burnet, M.D.       ASSESSMENT:  1.  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  DM 3.  HTN 4.  CKD stage 3 5.  COPD  PLAN:   1.  Continue IV Heparin gtt/NTG gtt/ASA/statin 2.  Plavix stopped 3.  Await CABG Monday per CVTS  Quintella Reichert, MD  03/01/2012  10:59 AM

## 2012-03-01 NOTE — Progress Notes (Signed)
  Echocardiogram 2D Echocardiogram has been performed.  [name] 03/01/2012, 8:54 AM

## 2012-03-01 NOTE — Progress Notes (Signed)
Pt had low CBG of 51; bedtime snack of a sandwich, soda, and peaches was given. Rechecked pts CBG 15 min later and it was 55. Gave pt 25mL of dextrose and it brought his CBG up to 119. MD was notified of this occurrence and instructed to not give his bedtime lantus insulin and that it will be followed up on this am. Will continue to monitor pt.

## 2012-03-01 NOTE — Progress Notes (Signed)
ANTICOAGULATION CONSULT NOTE   Pharmacy Consult for heparin Indication: chest pain/ACS  No Known Allergies  Patient Measurements: Height: 5\' 10"  (177.8 cm) Weight: 255 lb 8.2 oz (115.9 kg) IBW/kg (Calculated) : 73  Heparin Dosing Weight: 98kg  Vital Signs: Temp: 99.1 F (37.3 C) (09/12 0003) Temp src: Oral (09/12 0003) BP: 140/70 mmHg (09/11 1921) Pulse Rate: 79  (09/11 1600)  Labs:  Basename 03/01/12 0029 02/29/12 1416 02/29/12 0522 02/28/12 0438 02/27/12 1519  HGB -- -- 10.7* -- 11.8*  HCT -- -- 32.0* -- 34.1*  PLT -- -- 125* -- 132*  APTT -- -- -- -- 30  LABPROT -- -- -- -- 13.5  INR -- -- -- -- 1.01  HEPARINUNFRC 0.56 0.73* <0.10* -- --  CREATININE -- -- -- 1.56* 1.54*  CKTOTAL -- -- -- -- --  CKMB -- -- -- -- --  TROPONINI -- -- -- -- --    Estimated Creatinine Clearance: 53.8 ml/min (by C-G formula based on Cr of 1.56).  Assessment: 71 year old male with CAD awaiting CABG for Heparin  Goal of Therapy:  Heparin level 0.3-0.7 units/ml Monitor platelets by anticoagulation protocol: Yes   Plan:  Continue Heparin at current rate  Sharnese Heath, Gary Fleet 03/01/2012,1:52 AM

## 2012-03-02 DIAGNOSIS — I2510 Atherosclerotic heart disease of native coronary artery without angina pectoris: Secondary | ICD-10-CM

## 2012-03-02 LAB — BASIC METABOLIC PANEL
CO2: 24 mEq/L (ref 19–32)
GFR calc non Af Amer: 42 mL/min — ABNORMAL LOW (ref >90)
Glucose, Bld: 123 mg/dL — ABNORMAL HIGH (ref 70–99)
Potassium: 4.2 mEq/L (ref 3.5–5.1)
Sodium: 136 mEq/L (ref 135–145)

## 2012-03-02 LAB — URINALYSIS, ROUTINE W REFLEX MICROSCOPIC
Bilirubin Urine: NEGATIVE
Glucose, UA: 100 mg/dL — AB
Hgb urine dipstick: NEGATIVE
Ketones, ur: NEGATIVE mg/dL
Leukocytes, UA: NEGATIVE
Nitrite: NEGATIVE
Protein, ur: 100 mg/dL — AB
Specific Gravity, Urine: 1.007 (ref 1.005–1.030)
Urobilinogen, UA: 0.2 mg/dL (ref 0.0–1.0)
pH: 6 (ref 5.0–8.0)

## 2012-03-02 LAB — CBC
Hemoglobin: 10.4 g/dL — ABNORMAL LOW (ref 13.0–17.0)
MCHC: 34.8 g/dL (ref 30.0–36.0)
Platelets: 112 10*3/uL — ABNORMAL LOW (ref 150–400)
RBC: 3.21 MIL/uL — ABNORMAL LOW (ref 4.22–5.81)

## 2012-03-02 LAB — URINE MICROSCOPIC-ADD ON

## 2012-03-02 LAB — GLUCOSE, CAPILLARY
Glucose-Capillary: 155 mg/dL — ABNORMAL HIGH (ref 70–99)
Glucose-Capillary: 340 mg/dL — ABNORMAL HIGH (ref 70–99)

## 2012-03-02 LAB — HEPARIN LEVEL (UNFRACTIONATED): Heparin Unfractionated: 0.17 IU/mL — ABNORMAL LOW (ref 0.30–0.70)

## 2012-03-02 MED ORDER — CHLORHEXIDINE GLUCONATE 4 % EX LIQD
60.0000 mL | Freq: Once | CUTANEOUS | Status: DC
  Filled 2012-03-02: qty 60

## 2012-03-02 MED ORDER — BISACODYL 5 MG PO TBEC: 5.0000 mg | DELAYED_RELEASE_TABLET | Freq: Once | ORAL | Status: DC

## 2012-03-02 MED ORDER — METOPROLOL TARTRATE 12.5 MG HALF TABLET
12.5000 mg | ORAL_TABLET | Freq: Once | ORAL | Status: AC
  Administered 2012-03-05: 12.5 mg via ORAL
  Filled 2012-03-02 (×2): qty 1

## 2012-03-02 MED ORDER — TEMAZEPAM 15 MG PO CAPS: 15.0000 mg | ORAL_CAPSULE | Freq: Once | ORAL | Status: AC | PRN

## 2012-03-02 NOTE — Progress Notes (Signed)
ANTICOAGULATION CONSULT NOTE - Follow Up Consult  Pharmacy Consult for heparin Indication: chest pain/ACS  No Known Allergies  Patient Measurements: Height: 5\' 10"  (177.8 cm) Weight: 255 lb 8.2 oz (115.9 kg) IBW/kg (Calculated) : 73  Heparin Dosing Weight: 98 kg  Vital Signs: Temp: 98.2 F (36.8 C) (09/13 0730) Temp src: Oral (09/13 0730) BP: 138/72 mmHg (09/13 0730) Pulse Rate: 77  (09/13 0730)  Labs:  Basename 03/02/12 0525 03/01/12 1106 03/01/12 0420 03/01/12 0029 02/29/12 0522  HGB 10.4* -- 10.9* -- --  HCT 29.9* -- 32.1* -- 32.0*  PLT 112* -- 120* -- 125*  APTT -- -- -- -- --  LABPROT -- -- -- -- --  INR -- -- -- -- --  HEPARINUNFRC 0.17* -- 0.59 0.56 --  CREATININE 1.57* 1.58* -- -- --  CKTOTAL -- -- -- -- --  CKMB -- -- -- -- --  TROPONINI -- -- -- -- --    Estimated Creatinine Clearance: 53.5 ml/min (by C-G formula based on Cr of 1.57).   Medications:  Scheduled:     . aspirin  81 mg Oral Daily  . atorvastatin  80 mg Oral q1800  . brimonidine  1 drop Left Eye BID  . cholecalciferol  1,000 Units Oral Daily  . citalopram  40 mg Oral Daily  . dorzolamide-timolol  1 drop Left Eye BID  . erythromycin  1 application Right Eye BID  . exenatide  10 mcg Subcutaneous BID WC  . ezetimibe  10 mg Oral Daily  . ferrous sulfate  325 mg Oral BID WC  . insulin aspart  3-20 Units Subcutaneous TID WC  . insulin glargine  45 Units Subcutaneous BID  . ipratropium  1 spray Nasal BID  . loteprednol  1 drop Right Eye BID  . midodrine  2.5 mg Oral Daily  . pantoprazole  40 mg Oral Daily  . polyvinyl alcohol  1 drop Both Eyes BID  . risperiDONE  2 mg Oral QHS  . 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 On IV heparin, plan for CABG next week after plavix washout. Heparin level (=0.17) subtherapeutic this morning. CBC stable, no bleeding noted, no issues with infusion per  RN.  Goal of Therapy:  Heparin level 0.3-0.7 units/ml Monitor platelets by anticoagulation protocol: Yes   Plan:  - Increase heparin to 1700 units/hour - F/u 8 hr heparin level at 1700 - Daily heparin level and CBC - Monitor for s/sx bleeding  Bayard Hugger, PharmD, BCPS  Clinical Pharmacist  Pager: [phone]   03/02/2012 8:47 AM

## 2012-03-02 NOTE — Progress Notes (Signed)
SUBJECTIVE:  Doing well no complaints  OBJECTIVE:   Vitals:   Filed Vitals:   03/01/12 1930 03/02/12 0000 03/02/12 0400 03/02/12 0730  BP: 127/60 130/70 138/66 138/72  Pulse:    77  Temp: 98.7 F (37.1 C)  98.6 F (37 C) 98.2 F (36.8 C)  TempSrc: Oral Oral Oral Oral  Resp: 18 20 20 17   Height:      Weight:      SpO2: 93% 93% 92% 93%   I&O's:   Intake/Output Summary (Last 24 hours) at 03/02/12 0843 Last data filed at 03/02/12 0600  Gross per 24 hour  Intake   1118 ml  Output    800 ml  Net    318 ml   TELEMETRY: Reviewed telemetry pt in NSR     PHYSICAL EXAM General: Well developed, well nourished, in no acute distress Head: Eyes PERRLA, No xanthomas.   Normal cephalic and atramatic  Lungs:   Clear bilaterally to auscultation and percussion. Heart:   HRRR S1 S2 Pulses are 2+ & equal. Abdomen: Bowel sounds are positive, abdomen soft and non-tender without masses Extremities:   No clubbing, cyanosis or edema.  DP +1 Neuro: Alert and oriented X 3. Psych:  Good affect, responds appropriately   LABS: Basic Metabolic Panel:  Basename 03/02/12 0525 03/01/12 1106  NA 136 133*  K 4.2 4.8  CL 103 101  CO2 24 23  GLUCOSE 123* 202*  BUN 17 17  CREATININE 1.57* 1.58*  CALCIUM 9.1 9.1  MG -- --  PHOS -- --   Liver Function Tests: No results found for this basename: AST:2,ALT:2,ALKPHOS:2,BILITOT:2,PROT:2,ALBUMIN:2 in the last 72 hours No results found for this basename: LIPASE:2,AMYLASE:2 in the last 72 hours CBC:  Basename 03/02/12 0525 03/01/12 0420  WBC 7.0 7.2  NEUTROABS -- --  HGB 10.4* 10.9*  HCT 29.9* 32.1*  MCV 93.1 92.8  PLT 112* 120*   Coag Panel:   Lab Results  Component Value Date   INR 1.01 02/27/2012    RADIOLOGY: Dg Chest 2 View  02/27/2012  *RADIOLOGY REPORT*  Clinical Data: Preop  CHEST - 2 VIEW  Comparison: 08/21/2007  Findings: Normal heart size.  Lungs are under aerated with bibasilar atelectasis.  No pneumothorax and no pleural effusion.   IMPRESSION: Bibasilar atelectasis.   Original Report Authenticated By: Donavan Burnet, M.D.       ASSESSMENT:  1. 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 DM  3. HTN  4. CKD stage 3  5. COPD   PLAN:   1. Continue IV Heparin gtt/NTG gtt/ASA/statin  2. Plavix stopped  3. Await CABG Monday per CVTS   Quintella Reichert, MD  03/02/2012  8:43 AM

## 2012-03-02 NOTE — Progress Notes (Signed)
ANTICOAGULATION CONSULT NOTE - Follow Up Consult  Pharmacy Consult for Heparin Indication: chest pain/ACS  No Known Allergies  Patient Measurements: Height: 5\' 10"  (177.8 cm) Weight: 255 lb 8.2 oz (115.9 kg) IBW/kg (Calculated) : 73  Heparin Dosing Weight:   Vital Signs: Temp: 98.6 F (37 C) (09/13 1600) Temp src: Oral (09/13 1600) BP: 134/71 mmHg (09/13 1600) Pulse Rate: 69  (09/13 1600)  Labs:  Basename 03/02/12 1624 03/02/12 0525 03/01/12 1106 03/01/12 0420 02/29/12 0522  HGB -- 10.4* -- 10.9* --  HCT -- 29.9* -- 32.1* 32.0*  PLT -- 112* -- 120* 125*  APTT -- -- -- -- --  LABPROT -- -- -- -- --  INR -- -- -- -- --  HEPARINUNFRC 0.37 0.17* -- 0.59 --  CREATININE -- 1.57* 1.58* -- --  CKTOTAL -- -- -- -- --  CKMB -- -- -- -- --  TROPONINI -- -- -- -- --    Estimated Creatinine Clearance: 53.5 ml/min (by C-G formula based on Cr of 1.57).  Assessment: 71yo male with severe multivessel disease, completing Plavix washout for CABG on Monday.  Heparin level is therapeutic at 0.37 following rate adjustment this AM.  No problems noted.  Goal of Therapy:  Heparin level 0.3-0.7 units/ml Monitor platelets by anticoagulation protocol: Yes   Plan:  1.  Continue current rate 2.  F/U in AM  Marisue Humble, PharmD Clinical Pharmacist [facility]

## 2012-03-02 NOTE — Progress Notes (Signed)
Patient ID: [name], male   DOB: 1940/08/24, 71 y.o.   MRN: [id number] TCTS DAILY PROGRESS NOTE                   [address]            [place] [address]          [phone]      3 Days Post-Op Procedure(s) (LRB): LEFT HEART CATHETERIZATION WITH CORONARY ANGIOGRAM (N/A)  Total Length of Stay:  LOS: 4 days   Subjective: No cp, does not ambulate much but able to get into bathroom with help  Objective: Vital signs in last 24 hours: Temp:  [98.2 F (36.8 C)-98.8 F (37.1 C)] 98.5 F (36.9 C) (09/13 1145) Pulse Rate:  [72-77] 72  (09/13 1145) Cardiac Rhythm:  [-] Normal sinus rhythm (09/13 0800) Resp:  [16-20] 19  (09/13 1145) BP: (127-145)/(60-72) 145/70 mmHg (09/13 1145) SpO2:  [92 %-96 %] 95 % (09/13 1145)  Filed Weights   02/27/12 1100 02/28/12 0916  Weight: 255 lb (115.667 kg) 255 lb 8.2 oz (115.9 kg)    Weight change:    Hemodynamic parameters for last 24 hours:    Intake/Output from previous day: 09/12 0701 - 09/13 0700 In: 1144 [P.O.:720; I.V.:424] Out: 1450 [Urine:1450]  Intake/Output this shift:    Current Meds: Scheduled Meds:   . aspirin  81 mg Oral Daily  . atorvastatin  80 mg Oral q1800  . bisacodyl  5 mg Oral Once  . brimonidine  1 drop Left Eye BID  . chlorhexidine  60 mL Topical Once  . cholecalciferol  1,000 Units Oral Daily  . citalopram  40 mg Oral Daily  . dorzolamide-timolol  1 drop Left Eye BID  . erythromycin  1 application Right Eye BID  . exenatide  10 mcg Subcutaneous BID WC  . ezetimibe  10 mg Oral Daily  . ferrous sulfate  325 mg Oral BID WC  . insulin aspart  3-20 Units Subcutaneous TID WC  . insulin glargine  45 Units Subcutaneous BID  . ipratropium  1 spray Nasal BID  . loteprednol  1 drop Right Eye BID  . metoprolol tartrate  12.5 mg Oral Once  . midodrine  2.5 mg Oral Daily  . pantoprazole  40 mg Oral Daily  . polyvinyl alcohol  1 drop Both Eyes BID  . risperiDONE  2 mg Oral QHS  . sodium  chloride  3 mL Intravenous Q12H   Continuous Infusions:   . sodium chloride 75 mL/hr at 03/02/12 0044  . heparin 1,700 Units/hr (03/02/12 0900)  . nitroGLYCERIN 10 mcg/min (03/02/12 0600)   PRN Meds:.acetaminophen, albuterol, ondansetron (ZOFRAN) IV, temazepam  General appearance: alert, cooperative, appears older than stated age, moderately obese and slowed mentation Neurologic: intact Heart: regular rate and rhythm, S1, S2 normal, no murmur, click, rub or gallop and normal apical impulse Lungs: diminished breath sounds bibasilar Abdomen: soft, non-tender; bowel sounds normal; no masses,  no organomegaly Extremities: extremities normal, atraumatic, no cyanosis or edema and Homans sign is negative, no sign of DVT  Lab Results: CBC: Basename 03/02/12 0525 03/01/12 0420  WBC 7.0 7.2  HGB 10.4* 10.9*  HCT 29.9* 32.1*  PLT 112* 120*   BMET:  Basename 03/02/12 0525 03/01/12 1106  NA 136 133*  K 4.2 4.8  CL 103 101  CO2 24 23  GLUCOSE 123* 202*  BUN 17 17  CREATININE 1.57* 1.58*  CALCIUM 9.1 9.1  PT/INR: No results found for this basename: LABPROT,INR in the last 72 hours Radiology: No results found.  ECHO: done and reviewed, lv function 55%  Assessment/Plan: S/P Procedure(s) (LRB): LEFT HEART CATHETERIZATION WITH CORONARY ANGIOGRAM (N/A) Plan CABG Monday Cr stable 1.56 P2y12 elevated, off plavix  The goals risks and alternatives of the planned surgical procedure CABGhave been discussed with the patient in detail. The risks of the procedure including death, infection, stroke, myocardial infarction, bleeding, blood transfusion have all been discussed specifically.  I have quoted [name] a 15 % of perioperative mortality and a complication rate as high as 30  %. The patient's questions have been answered.[name] is willing  to proceed with the planned procedure.     [name] MD  Beeper [phone] Office [phone] 03/02/2012 1:42 PM

## 2012-03-03 LAB — BASIC METABOLIC PANEL
BUN: 21 mg/dL (ref 6–23)
Calcium: 9 mg/dL (ref 8.4–10.5)
Chloride: 99 mEq/L (ref 96–112)
Creatinine, Ser: 1.5 mg/dL — ABNORMAL HIGH (ref 0.50–1.35)
GFR calc Af Amer: 52 mL/min — ABNORMAL LOW (ref >90)

## 2012-03-03 LAB — CBC
HCT: 29.1 % — ABNORMAL LOW (ref 39.0–52.0)
MCH: 32.4 pg (ref 26.0–34.0)
MCV: 93.3 fL (ref 78.0–100.0)
Platelets: 108 10*3/uL — ABNORMAL LOW (ref 150–400)
RDW: 12 % (ref 11.5–15.5)
WBC: 7.1 10*3/uL (ref 4.0–10.5)

## 2012-03-03 LAB — GLUCOSE, CAPILLARY: Glucose-Capillary: 213 mg/dL — ABNORMAL HIGH (ref 70–99)

## 2012-03-03 LAB — HEPARIN LEVEL (UNFRACTIONATED): Heparin Unfractionated: 0.36 IU/mL (ref 0.30–0.70)

## 2012-03-03 MED ORDER — BISACODYL 5 MG PO TBEC
5.0000 mg | DELAYED_RELEASE_TABLET | Freq: Every day | ORAL | Status: DC | PRN
  Administered 2012-03-03 – 2012-03-04 (×2): 5 mg via ORAL
  Filled 2012-03-03 (×2): qty 1

## 2012-03-03 MED ORDER — SODIUM CHLORIDE 0.9 % IV SOLN
INTRAVENOUS | Status: DC
  Administered 2012-03-04: 06:00:00 via INTRAVENOUS

## 2012-03-03 NOTE — Progress Notes (Signed)
ANTICOAGULATION CONSULT NOTE - Follow Up Consult  Pharmacy Consult for Heparin Indication: chest pain/ACS  No Known Allergies  Patient Measurements: Height: 5\' 10"  (177.8 cm) Weight: 255 lb 8.2 oz (115.9 kg) IBW/kg (Calculated) : 73  Heparin Dosing Weight:   Vital Signs: Temp: 98.2 F (36.8 C) (09/14 1213) Temp src: Oral (09/14 1213) BP: 133/67 mmHg (09/14 0753) Pulse Rate: 76  (09/14 0753)  Labs:  Basename 03/03/12 1235 03/03/12 0513 03/02/12 1624 03/02/12 0525 03/01/12 1106 03/01/12 0420  HGB -- 10.1* -- 10.4* -- --  HCT -- 29.1* -- 29.9* -- 32.1*  PLT -- 108* -- 112* -- 120*  APTT -- -- -- -- -- --  LABPROT -- -- -- -- -- --  INR -- -- -- -- -- --  HEPARINUNFRC 0.36 0.20* 0.37 -- -- --  CREATININE -- 1.50* -- 1.57* 1.58* --  CKTOTAL -- -- -- -- -- --  CKMB -- -- -- -- -- --  TROPONINI -- -- -- -- -- --    Estimated Creatinine Clearance: 56 ml/min (by C-G formula based on Cr of 1.5).  Assessment: 71yo male with severe multivessel disease, completing Plavix washout for CABG on Monday.  Heparin level is therapeutic at 0.36 following rate adjustment this AM.  Hgb/Plts stable, no bleeding noted per chart.  Goal of Therapy:  Heparin level 0.3-0.7 units/ml Monitor platelets by anticoagulation protocol: Yes   Plan:  1.  Continue heparin infusion 1900 units/hr 2.  F/U in AM  Bayard Hugger, PharmD, BCPS  Clinical Pharmacist  Pager: [phone]

## 2012-03-03 NOTE — Progress Notes (Signed)
ANTICOAGULATION CONSULT NOTE - Follow Up Consult  Pharmacy Consult for heparin Indication: chest pain/ACS  Labs:  Basename 03/03/12 0513 03/02/12 1624 03/02/12 0525 03/01/12 1106 03/01/12 0420  HGB 10.1* -- 10.4* -- --  HCT 29.1* -- 29.9* -- 32.1*  PLT PENDING -- 112* -- 120*  APTT -- -- -- -- --  LABPROT -- -- -- -- --  INR -- -- -- -- --  HEPARINUNFRC 0.20* 0.37 0.17* -- --  CREATININE -- -- 1.57* 1.58* --  CKTOTAL -- -- -- -- --  CKMB -- -- -- -- --  TROPONINI -- -- -- -- --    Assessment: 71yo male now subtherapeutic on heparin after one level at goal; no issues with infusion or IV site per RN.  Goal of Therapy:  Heparin level 0.3-0.7 units/ml   Plan:  Will increase heparin gtt by 2 units/kg/hr to 1900 units/hr and check level in 6hr.  Colleen Can PharmD BCPS 03/03/2012,6:22 AM

## 2012-03-03 NOTE — Progress Notes (Signed)
Subjective:  Feeling well. HA with NTG. No CP. Resting.   Objective:  Vital Signs in the last 24 hours: Temp:  [98 F (36.7 C)-98.6 F (37 C)] 98 F (36.7 C) (09/14 0752) Pulse Rate:  [65-76] 76  (09/14 0753) Resp:  [18-19] 18  (09/14 0752) BP: (124-145)/(62-71) 133/67 mmHg (09/14 0753) SpO2:  [91 %-96 %] 94 % (09/14 0753)  Intake/Output from previous day: 09/13 0701 - 09/14 0700 In: 319 [P.O.:120; I.V.:199] Out: 900 [Urine:900]   Physical Exam: General: Well developed, well nourished, in no acute distress. Head:  Normocephalic and atraumatic. Lungs: Clear to auscultation and percussion. Heart: Normal S1 and S2.  No murmur, rubs or gallops.  Abdomen: soft, non-tender, positive bowel sounds. Extremities: No clubbing or cyanosis. No edema. Neurologic: Alert and oriented x 3.    Lab Results:  Basename 03/03/12 0513 03/02/12 0525  WBC 7.1 7.0  HGB 10.1* 10.4*  PLT 108* 112*    Basename 03/03/12 0513 03/02/12 0525  NA 132* 136  K 4.3 4.2  CL 99 103  CO2 23 24  GLUCOSE 231* 123*  BUN 21 17  CREATININE 1.50* 1.57*   Telemetry: no adverse rhythms Personally viewed.   Cardiac Studies:  Normal EF, 2 v CAD left main.   Assessment/Plan:   71 year old with severe CAD, LM disease, DM, HTN, CKD, COPD.   1. CAD - await surgery. Monday. Reviewed Dr. Dennie Maizes note. No plavix. ASA, HEP, STATIN, BB. I will stop NTG due to HA. If chest pain returns restart. P2Y12 function 321.   2. CKD - creat mildly improved.   3. DM - stable    [name] 03/03/2012, 10:01 AM

## 2012-03-04 LAB — POCT I-STAT 3, ART BLOOD GAS (G3+)
Acid-base deficit: 4 mmol/L — ABNORMAL HIGH (ref 0.0–2.0)
O2 Saturation: 93 %
TCO2: 23 mmol/L (ref 0–100)
pCO2 arterial: 38.9 mmHg (ref 35.0–45.0)

## 2012-03-04 LAB — GLUCOSE, CAPILLARY: Glucose-Capillary: 282 mg/dL — ABNORMAL HIGH (ref 70–99)

## 2012-03-04 LAB — CBC
HCT: 29.8 % — ABNORMAL LOW (ref 39.0–52.0)
MCH: 31.8 pg (ref 26.0–34.0)
MCHC: 34.2 g/dL (ref 30.0–36.0)
MCV: 92.8 fL (ref 78.0–100.0)
Platelets: 118 10*3/uL — ABNORMAL LOW (ref 150–400)
RDW: 12 % (ref 11.5–15.5)

## 2012-03-04 LAB — BASIC METABOLIC PANEL
BUN: 20 mg/dL (ref 6–23)
Calcium: 9.2 mg/dL (ref 8.4–10.5)
Creatinine, Ser: 1.61 mg/dL — ABNORMAL HIGH (ref 0.50–1.35)
GFR calc Af Amer: 47 mL/min — ABNORMAL LOW (ref >90)
GFR calc non Af Amer: 41 mL/min — ABNORMAL LOW (ref >90)

## 2012-03-04 MED ORDER — TRANEXAMIC ACID (OHS) PUMP PRIME SOLUTION
2.0000 mg/kg | INTRAVENOUS | Status: DC
  Filled 2012-03-04: qty 2.32

## 2012-03-04 MED ORDER — DEXMEDETOMIDINE HCL IN NACL 400 MCG/100ML IV SOLN
0.1000 ug/kg/h | INTRAVENOUS | Status: AC
  Administered 2012-03-05: 0.3 ug/kg/h via INTRAVENOUS
  Filled 2012-03-04: qty 100

## 2012-03-04 MED ORDER — MAGNESIUM SULFATE 50 % IJ SOLN
40.0000 meq | INTRAMUSCULAR | Status: DC
  Filled 2012-03-04: qty 10

## 2012-03-04 MED ORDER — DEXTROSE 5 % IV SOLN
1.5000 g | INTRAVENOUS | Status: AC
  Administered 2012-03-05: 1.5 g via INTRAVENOUS
  Administered 2012-03-05: .75 g via INTRAVENOUS
  Filled 2012-03-04: qty 1.5

## 2012-03-04 MED ORDER — POTASSIUM CHLORIDE 2 MEQ/ML IV SOLN
80.0000 meq | INTRAVENOUS | Status: DC
Start: 2012-03-05 — End: 2012-03-05
  Filled 2012-03-04: qty 40

## 2012-03-04 MED ORDER — VANCOMYCIN HCL 1000 MG IV SOLR
1250.0000 mg | INTRAVENOUS | Status: AC
  Administered 2012-03-05: 1250 mg via INTRAVENOUS
  Filled 2012-03-04: qty 1250

## 2012-03-04 MED ORDER — EPINEPHRINE HCL 1 MG/ML IJ SOLN
0.5000 ug/min | INTRAVENOUS | Status: DC
  Filled 2012-03-04: qty 4

## 2012-03-04 MED ORDER — NITROGLYCERIN IN D5W 200-5 MCG/ML-% IV SOLN
2.0000 ug/min | INTRAVENOUS | Status: AC
  Administered 2012-03-05: 5 ug/min via INTRAVENOUS
  Filled 2012-03-04: qty 250

## 2012-03-04 MED ORDER — PLASMA-LYTE 148 IV SOLN
INTRAVENOUS | Status: AC
  Administered 2012-03-05: 10:00:00
  Filled 2012-03-04: qty 2.5

## 2012-03-04 MED ORDER — PHENYLEPHRINE HCL 10 MG/ML IJ SOLN
30.0000 ug/min | INTRAVENOUS | Status: AC
  Administered 2012-03-05: 25 ug/min via INTRAVENOUS
  Filled 2012-03-04: qty 2

## 2012-03-04 MED ORDER — SODIUM CHLORIDE 0.9 % IV SOLN
INTRAVENOUS | Status: AC
  Administered 2012-03-05: 12.5 [IU]/h via INTRAVENOUS
  Filled 2012-03-04: qty 1

## 2012-03-04 MED ORDER — DEXTROSE 5 % IV SOLN
750.0000 mg | INTRAVENOUS | Status: DC
  Filled 2012-03-04 (×2): qty 750

## 2012-03-04 MED ORDER — TRANEXAMIC ACID (OHS) BOLUS VIA INFUSION
15.0000 mg/kg | INTRAVENOUS | Status: AC
  Administered 2012-03-05: 1738.5 mg via INTRAVENOUS
  Filled 2012-03-04: qty 1739

## 2012-03-04 MED ORDER — DEXTROSE 5 % IV SOLN
2.0000 g | INTRAVENOUS | Status: DC
  Filled 2012-03-04: qty 2

## 2012-03-04 MED ORDER — SODIUM CHLORIDE 0.9 % IV SOLN
1.5000 mg/kg/h | INTRAVENOUS | Status: AC
  Administered 2012-03-05: 1.5 mg/kg/h via INTRAVENOUS
  Filled 2012-03-04: qty 25

## 2012-03-04 MED ORDER — DOPAMINE-DEXTROSE 3.2-5 MG/ML-% IV SOLN
2.0000 ug/kg/min | INTRAVENOUS | Status: AC
  Administered 2012-03-05: 3 ug/kg/min via INTRAVENOUS
  Filled 2012-03-04: qty 250

## 2012-03-04 NOTE — Progress Notes (Signed)
ANTICOAGULATION CONSULT NOTE - Follow Up Consult  Pharmacy Consult for Heparin Indication: chest pain/ACS  No Known Allergies  Patient Measurements: Height: 5\' 10"  (177.8 cm) Weight: 255 lb 8.2 oz (115.9 kg) IBW/kg (Calculated) : 73  Heparin Dosing Weight: 98 kg  Vital Signs: Temp: 99.3 F (37.4 C) (09/15 0723) Temp src: Oral (09/15 0723) BP: 145/72 mmHg (09/15 0341) Pulse Rate: 82  (09/15 0341)  Labs:  Basename 03/04/12 0534 03/03/12 1235 03/03/12 0513 03/02/12 0525  HGB 10.2* -- 10.1* --  HCT 29.8* -- 29.1* 29.9*  PLT 118* -- 108* 112*  APTT -- -- -- --  LABPROT -- -- -- --  INR -- -- -- --  HEPARINUNFRC 0.38 0.36 0.20* --  CREATININE 1.61* -- 1.50* 1.57*  CKTOTAL -- -- -- --  CKMB -- -- -- --  TROPONINI -- -- -- --    Estimated Creatinine Clearance: 52.1 ml/min (by C-G formula based on Cr of 1.61).  Assessment: 71yo male with severe multivessel disease, completing Plavix washout for CABG on Monday.  Heparin level is therapeutic at 0.38 this morning.  Hgb/Plts stable, no bleeding noted per chart.  Goal of Therapy:  Heparin level 0.3-0.7 units/ml Monitor platelets by anticoagulation protocol: Yes   Plan:  1.  Continue heparin infusion 1900 units/hr 2.  F/U after CABG  Bayard Hugger, PharmD, BCPS  Clinical Pharmacist  Pager: [phone]

## 2012-03-05 ENCOUNTER — Inpatient Hospital Stay (HOSPITAL_COMMUNITY): Payer: Medicare Other | Admitting: *Deleted

## 2012-03-05 ENCOUNTER — Encounter (HOSPITAL_COMMUNITY)
Admission: AD | Disposition: A | Discharge status: Skilled Nursing Facility | Payer: Self-pay | Source: Ambulatory Visit | Attending: Cardiothoracic Surgery

## 2012-03-05 ENCOUNTER — Inpatient Hospital Stay (HOSPITAL_COMMUNITY): Payer: Medicare Other

## 2012-03-05 ENCOUNTER — Encounter (HOSPITAL_COMMUNITY): Payer: Self-pay | Admitting: *Deleted

## 2012-03-05 DIAGNOSIS — I2510 Atherosclerotic heart disease of native coronary artery without angina pectoris: Secondary | ICD-10-CM

## 2012-03-05 HISTORY — PX: CORONARY ARTERY BYPASS GRAFT: SHX141

## 2012-03-05 LAB — CBC
HCT: 16.3 % — ABNORMAL LOW (ref 39.0–52.0)
HCT: 30.2 % — ABNORMAL LOW (ref 39.0–52.0)
HCT: 29.6 % — ABNORMAL LOW (ref 39.0–52.0)
Hemoglobin: 5.5 g/dL — CL (ref 13.0–17.0)
Hemoglobin: 10.5 g/dL — ABNORMAL LOW (ref 13.0–17.0)
Hemoglobin: 10.4 g/dL — ABNORMAL LOW (ref 13.0–17.0)
MCH: 31.8 pg (ref 26.0–34.0)
MCH: 29.2 pg (ref 26.0–34.0)
MCH: 29.3 pg (ref 26.0–34.0)
MCHC: 33.7 g/dL (ref 30.0–36.0)
MCHC: 34.8 g/dL (ref 30.0–36.0)
MCHC: 35.1 g/dL (ref 30.0–36.0)
MCV: 94.2 fL (ref 78.0–100.0)
MCV: 83.9 fL (ref 78.0–100.0)
MCV: 83.4 fL (ref 78.0–100.0)
Platelets: 104 10*3/uL — ABNORMAL LOW (ref 150–400)
Platelets: 88 10*3/uL — ABNORMAL LOW (ref 150–400)
Platelets: 116 10*3/uL — ABNORMAL LOW (ref 150–400)
RBC: 1.73 MIL/uL — ABNORMAL LOW (ref 4.22–5.81)
RBC: 3.6 MIL/uL — ABNORMAL LOW (ref 4.22–5.81)
RBC: 3.55 MIL/uL — ABNORMAL LOW (ref 4.22–5.81)
RDW: 12.5 % (ref 11.5–15.5)
RDW: 17.1 % — ABNORMAL HIGH (ref 11.5–15.5)
RDW: 18.1 % — ABNORMAL HIGH (ref 11.5–15.5)
WBC: 12 10*3/uL — ABNORMAL HIGH (ref 4.0–10.5)
WBC: 16.9 10*3/uL — ABNORMAL HIGH (ref 4.0–10.5)
WBC: 17.5 10*3/uL — ABNORMAL HIGH (ref 4.0–10.5)

## 2012-03-05 LAB — HEPATIC FUNCTION PANEL
ALT: 24 U/L (ref 0–53)
AST: 38 U/L — ABNORMAL HIGH (ref 0–37)
Albumin: 2.5 g/dL — ABNORMAL LOW (ref 3.5–5.2)
Alkaline Phosphatase: 29 U/L — ABNORMAL LOW (ref 39–117)
Bilirubin, Direct: 0.1 mg/dL (ref 0.0–0.3)
Indirect Bilirubin: 0.4 mg/dL (ref 0.3–0.9)
Total Bilirubin: 0.5 mg/dL (ref 0.3–1.2)
Total Protein: 4.3 g/dL — ABNORMAL LOW (ref 6.0–8.3)

## 2012-03-05 LAB — POCT I-STAT 3, ART BLOOD GAS (G3+)
Acid-base deficit: 7 mmol/L — ABNORMAL HIGH (ref 0.0–2.0)
Acid-base deficit: 7 mmol/L — ABNORMAL HIGH (ref 0.0–2.0)
Acid-base deficit: 4 mmol/L — ABNORMAL HIGH (ref 0.0–2.0)
Bicarbonate: 19.4 mEq/L — ABNORMAL LOW (ref 20.0–24.0)
Bicarbonate: 19.6 mEq/L — ABNORMAL LOW (ref 20.0–24.0)
Bicarbonate: 21.8 mEq/L (ref 20.0–24.0)
O2 Saturation: 100 %
O2 Saturation: 100 %
O2 Saturation: 91 %
O2 Saturation: 97 %
Patient temperature: 36.6
Patient temperature: 36.7
TCO2: 20 mmol/L (ref 0–100)
TCO2: 21 mmol/L (ref 0–100)
TCO2: 21 mmol/L (ref 0–100)
pCO2 arterial: 47.5 mmHg — ABNORMAL HIGH (ref 35.0–45.0)
pH, Arterial: 7.27 — ABNORMAL LOW (ref 7.350–7.450)
pH, Arterial: 7.269 — ABNORMAL LOW (ref 7.350–7.450)
pO2, Arterial: 65 mmHg — ABNORMAL LOW (ref 80.0–100.0)
pO2, Arterial: 85 mmHg (ref 80.0–100.0)

## 2012-03-05 LAB — SURGICAL PCR SCREEN
MRSA, PCR: NEGATIVE
Staphylococcus aureus: NEGATIVE

## 2012-03-05 LAB — BASIC METABOLIC PANEL
BUN: 32 mg/dL — ABNORMAL HIGH (ref 6–23)
CO2: 19 mEq/L (ref 19–32)
Calcium: 7.6 mg/dL — ABNORMAL LOW (ref 8.4–10.5)
Chloride: 103 mEq/L (ref 96–112)
Creatinine, Ser: 2.14 mg/dL — ABNORMAL HIGH (ref 0.50–1.35)
GFR calc Af Amer: 34 mL/min — ABNORMAL LOW (ref >90)
GFR calc non Af Amer: 29 mL/min — ABNORMAL LOW (ref >90)
Glucose, Bld: 417 mg/dL — ABNORMAL HIGH (ref 70–99)
Potassium: 5.5 mEq/L — ABNORMAL HIGH (ref 3.5–5.1)
Sodium: 130 mEq/L — ABNORMAL LOW (ref 135–145)

## 2012-03-05 LAB — POCT I-STAT 4, (NA,K, GLUC, HGB,HCT)
Glucose, Bld: 484 mg/dL — ABNORMAL HIGH (ref 70–99)
Glucose, Bld: 412 mg/dL — ABNORMAL HIGH (ref 70–99)
Glucose, Bld: 325 mg/dL — ABNORMAL HIGH (ref 70–99)
Glucose, Bld: 302 mg/dL — ABNORMAL HIGH (ref 70–99)
Glucose, Bld: 251 mg/dL — ABNORMAL HIGH (ref 70–99)
HCT: 17 % — ABNORMAL LOW (ref 39.0–52.0)
HCT: 18 % — ABNORMAL LOW (ref 39.0–52.0)
HCT: 24 % — ABNORMAL LOW (ref 39.0–52.0)
HCT: 31 % — ABNORMAL LOW (ref 39.0–52.0)
Hemoglobin: 6.1 g/dL — CL (ref 13.0–17.0)
Hemoglobin: 10.5 g/dL — ABNORMAL LOW (ref 13.0–17.0)
Potassium: 5.7 mEq/L — ABNORMAL HIGH (ref 3.5–5.1)
Potassium: 5.5 mEq/L — ABNORMAL HIGH (ref 3.5–5.1)
Potassium: 6.2 mEq/L — ABNORMAL HIGH (ref 3.5–5.1)
Potassium: 5.3 mEq/L — ABNORMAL HIGH (ref 3.5–5.1)
Sodium: 131 mEq/L — ABNORMAL LOW (ref 135–145)
Sodium: 132 mEq/L — ABNORMAL LOW (ref 135–145)
Sodium: 131 mEq/L — ABNORMAL LOW (ref 135–145)
Sodium: 134 mEq/L — ABNORMAL LOW (ref 135–145)

## 2012-03-05 LAB — HEMOGLOBIN AND HEMATOCRIT, BLOOD
HCT: 23.6 % — ABNORMAL LOW (ref 39.0–52.0)
Hemoglobin: 8.2 g/dL — ABNORMAL LOW (ref 13.0–17.0)

## 2012-03-05 LAB — GLUCOSE, CAPILLARY
Glucose-Capillary: 134 mg/dL — ABNORMAL HIGH (ref 70–99)
Glucose-Capillary: 390 mg/dL — ABNORMAL HIGH (ref 70–99)
Glucose-Capillary: 189 mg/dL — ABNORMAL HIGH (ref 70–99)
Glucose-Capillary: 182 mg/dL — ABNORMAL HIGH (ref 70–99)
Glucose-Capillary: 67 mg/dL — ABNORMAL LOW (ref 70–99)
Glucose-Capillary: 91 mg/dL (ref 70–99)
Glucose-Capillary: 62 mg/dL — ABNORMAL LOW (ref 70–99)

## 2012-03-05 LAB — CREATININE, SERUM
Creatinine, Ser: 2.16 mg/dL — ABNORMAL HIGH (ref 0.50–1.35)
GFR calc Af Amer: 33 mL/min — ABNORMAL LOW (ref >90)
GFR calc non Af Amer: 29 mL/min — ABNORMAL LOW (ref >90)

## 2012-03-05 LAB — POCT I-STAT, CHEM 8
BUN: 31 mg/dL — ABNORMAL HIGH (ref 6–23)
Sodium: 140 mEq/L (ref 135–145)
TCO2: 20 mmol/L (ref 0–100)

## 2012-03-05 LAB — PLATELET COUNT: Platelets: 77 10*3/uL — ABNORMAL LOW (ref 150–400)

## 2012-03-05 LAB — APTT: aPTT: 27 seconds (ref 24–37)

## 2012-03-05 LAB — MAGNESIUM: Magnesium: 2 mg/dL (ref 1.5–2.5)

## 2012-03-05 SURGERY — CORONARY ARTERY BYPASS GRAFTING (CABG)
Anesthesia: General | Site: Chest | Wound class: Clean

## 2012-03-05 MED ORDER — SODIUM CHLORIDE 0.9 % IV SOLN
INTRAVENOUS | Status: DC
  Filled 2012-03-05: qty 1

## 2012-03-05 MED ORDER — SODIUM CHLORIDE 0.9 % IV SOLN
INTRAVENOUS | Status: DC | PRN
  Administered 2012-03-05 (×2): via INTRAVENOUS

## 2012-03-05 MED ORDER — OXYCODONE HCL 5 MG PO TABS
5.0000 mg | ORAL_TABLET | ORAL | Status: DC | PRN
  Administered 2012-03-06: 10 mg via ORAL
  Administered 2012-03-06: 5 mg via ORAL
  Administered 2012-03-07 – 2012-03-09 (×4): 10 mg via ORAL
  Administered 2012-03-10 – 2012-03-12 (×2): 5 mg via ORAL
  Administered 2012-03-12 – 2012-03-22 (×18): 10 mg via ORAL
  Administered 2012-03-23 (×2): 5 mg via ORAL
  Administered 2012-03-24 – 2012-03-25 (×3): 10 mg via ORAL
  Filled 2012-03-05: qty 2
  Filled 2012-03-05: qty 1
  Filled 2012-03-05: qty 2
  Filled 2012-03-05 (×2): qty 1
  Filled 2012-03-05 (×3): qty 2
  Filled 2012-03-05: qty 1
  Filled 2012-03-05 (×20): qty 2
  Filled 2012-03-05: qty 1
  Filled 2012-03-05: qty 2

## 2012-03-05 MED ORDER — SODIUM CHLORIDE 0.9 % IV SOLN
INTRAVENOUS | Status: DC
  Administered 2012-03-05: 7.8 [IU]/h via INTRAVENOUS
  Administered 2012-03-05: 5.4 [IU]/h via INTRAVENOUS
  Administered 2012-03-05: 2.8 [IU]/h via INTRAVENOUS
  Filled 2012-03-05: qty 1

## 2012-03-05 MED ORDER — VANCOMYCIN HCL IN DEXTROSE 1-5 GM/200ML-% IV SOLN
1000.0000 mg | Freq: Once | INTRAVENOUS | Status: AC
  Administered 2012-03-05: 1000 mg via INTRAVENOUS
  Filled 2012-03-05: qty 200

## 2012-03-05 MED ORDER — DEXTROSE 5 % IV SOLN
1.5000 g | Freq: Two times a day (BID) | INTRAVENOUS | Status: AC
  Administered 2012-03-06 – 2012-03-07 (×4): 1.5 g via INTRAVENOUS
  Filled 2012-03-05 (×4): qty 1.5

## 2012-03-05 MED ORDER — ROCURONIUM BROMIDE 100 MG/10ML IV SOLN
INTRAVENOUS | Status: DC | PRN
  Administered 2012-03-05: 50 mg via INTRAVENOUS

## 2012-03-05 MED ORDER — DOPAMINE-DEXTROSE 3.2-5 MG/ML-% IV SOLN
3.0000 ug/kg/min | INTRAVENOUS | Status: DC
  Administered 2012-03-07 – 2012-03-08 (×2): 3 ug/kg/min via INTRAVENOUS
  Filled 2012-03-05 (×2): qty 250

## 2012-03-05 MED ORDER — NITROGLYCERIN IN D5W 200-5 MCG/ML-% IV SOLN: 0.0000 ug/min | INTRAVENOUS | Status: DC

## 2012-03-05 MED ORDER — FUROSEMIDE 10 MG/ML IJ SOLN
INTRAMUSCULAR | Status: DC | PRN
  Administered 2012-03-05: 10 mg via INTRAMUSCULAR
  Administered 2012-03-05: 20 mg via INTRAMUSCULAR
  Administered 2012-03-05: 10 mg via INTRAMUSCULAR
  Administered 2012-03-05: 20 mg via INTRAMUSCULAR
  Administered 2012-03-05 (×2): 10 mg via INTRAMUSCULAR

## 2012-03-05 MED ORDER — SODIUM CHLORIDE 0.9 % IV SOLN: 250.0000 mL | INTRAVENOUS | Status: DC

## 2012-03-05 MED ORDER — SODIUM CHLORIDE 0.9 % IV SOLN
INTRAVENOUS | Status: DC | PRN
  Administered 2012-03-05 (×2): via INTRAVENOUS

## 2012-03-05 MED ORDER — ONDANSETRON HCL 4 MG/2ML IJ SOLN: 4.0000 mg | Freq: Four times a day (QID) | INTRAMUSCULAR | Status: DC | PRN

## 2012-03-05 MED ORDER — SODIUM CHLORIDE 0.9 % IJ SOLN
OROMUCOSAL | Status: DC | PRN
  Administered 2012-03-05 (×3): via TOPICAL

## 2012-03-05 MED ORDER — BISACODYL 10 MG RE SUPP: 10.0000 mg | Freq: Every day | RECTAL | Status: DC

## 2012-03-05 MED ORDER — METOPROLOL TARTRATE 12.5 MG HALF TABLET
12.5000 mg | ORAL_TABLET | Freq: Two times a day (BID) | ORAL | Status: DC
  Administered 2012-03-06 – 2012-03-08 (×4): 12.5 mg via ORAL
  Filled 2012-03-05 (×9): qty 1

## 2012-03-05 MED ORDER — ACETAMINOPHEN 160 MG/5ML PO SOLN: 975.0000 mg | Freq: Four times a day (QID) | ORAL | Status: AC

## 2012-03-05 MED ORDER — ACETAMINOPHEN 650 MG RE SUPP
650.0000 mg | RECTAL | Status: AC
  Administered 2012-03-05: 650 mg via RECTAL

## 2012-03-05 MED ORDER — LACTATED RINGERS IV SOLN: 500.0000 mL | Freq: Once | INTRAVENOUS | Status: AC | PRN

## 2012-03-05 MED ORDER — ALBUMIN HUMAN 5 % IV SOLN
250.0000 mL | INTRAVENOUS | Status: AC | PRN
  Administered 2012-03-05 (×2): 250 mL via INTRAVENOUS

## 2012-03-05 MED ORDER — MORPHINE SULFATE 2 MG/ML IJ SOLN: 1.0000 mg | INTRAMUSCULAR | Status: AC | PRN

## 2012-03-05 MED ORDER — MAGNESIUM SULFATE 40 MG/ML IJ SOLN: 4.0000 g | Freq: Once | INTRAMUSCULAR | Status: DC

## 2012-03-05 MED ORDER — SODIUM CHLORIDE 0.45 % IV SOLN: INTRAVENOUS | Status: DC

## 2012-03-05 MED ORDER — METOPROLOL TARTRATE 1 MG/ML IV SOLN
2.5000 mg | INTRAVENOUS | Status: DC | PRN
  Administered 2012-03-12: 5 mg via INTRAVENOUS
  Filled 2012-03-05 (×2): qty 5

## 2012-03-05 MED ORDER — ASPIRIN EC 325 MG PO TBEC
325.0000 mg | DELAYED_RELEASE_TABLET | Freq: Every day | ORAL | Status: DC
  Administered 2012-03-06 – 2012-03-27 (×22): 325 mg via ORAL
  Filled 2012-03-05 (×22): qty 1

## 2012-03-05 MED ORDER — INSULIN REGULAR BOLUS VIA INFUSION
0.0000 [IU] | Freq: Three times a day (TID) | INTRAVENOUS | Status: DC
  Filled 2012-03-05: qty 10

## 2012-03-05 MED ORDER — PHENYLEPHRINE HCL 10 MG/ML IJ SOLN
0.0000 ug/min | INTRAMUSCULAR | Status: DC
  Filled 2012-03-05 (×3): qty 2

## 2012-03-05 MED ORDER — PROPOFOL 10 MG/ML IV BOLUS
INTRAVENOUS | Status: DC | PRN
  Administered 2012-03-05: 100 mg via INTRAVENOUS

## 2012-03-05 MED ORDER — SODIUM CHLORIDE 0.9 % IJ SOLN
3.0000 mL | Freq: Two times a day (BID) | INTRAMUSCULAR | Status: DC
  Administered 2012-03-06 – 2012-03-08 (×6): 3 mL via INTRAVENOUS

## 2012-03-05 MED ORDER — DEXTROSE 50 % IV SOLN
INTRAVENOUS | Status: AC
  Administered 2012-03-05: 15 mL
  Filled 2012-03-05: qty 50

## 2012-03-05 MED ORDER — BISACODYL 5 MG PO TBEC
10.0000 mg | DELAYED_RELEASE_TABLET | Freq: Every day | ORAL | Status: DC
  Administered 2012-03-06 – 2012-03-21 (×14): 10 mg via ORAL
  Filled 2012-03-05 (×13): qty 2

## 2012-03-05 MED ORDER — AMIODARONE HCL IN DEXTROSE 360-4.14 MG/200ML-% IV SOLN
0.5000 mg/min | INTRAVENOUS | Status: DC
  Administered 2012-03-06 – 2012-03-08 (×4): 0.5 mg/min via INTRAVENOUS
  Filled 2012-03-05 (×15): qty 200

## 2012-03-05 MED ORDER — FENTANYL CITRATE 0.05 MG/ML IJ SOLN
INTRAMUSCULAR | Status: DC | PRN
  Administered 2012-03-05 (×3): 100 ug via INTRAVENOUS
  Administered 2012-03-05: 250 ug via INTRAVENOUS
  Administered 2012-03-05: 50 ug via INTRAVENOUS
  Administered 2012-03-05 (×2): 150 ug via INTRAVENOUS
  Administered 2012-03-05 (×2): 200 ug via INTRAVENOUS

## 2012-03-05 MED ORDER — METOPROLOL TARTRATE 25 MG/10 ML ORAL SUSPENSION
12.5000 mg | Freq: Two times a day (BID) | ORAL | Status: DC
  Administered 2012-03-08: 12.5 mg
  Filled 2012-03-05 (×9): qty 5

## 2012-03-05 MED ORDER — SODIUM CHLORIDE 0.9 % IV SOLN
INTRAVENOUS | Status: DC | PRN
  Administered 2012-03-05: 07:00:00 via INTRAVENOUS

## 2012-03-05 MED ORDER — HEPARIN SODIUM (PORCINE) 1000 UNIT/ML IJ SOLN
INTRAMUSCULAR | Status: DC | PRN
  Administered 2012-03-05: 15000 [IU] via INTRAVENOUS
  Administered 2012-03-05: 5000 [IU] via INTRAVENOUS
  Administered 2012-03-05: 10000 [IU] via INTRAVENOUS
  Administered 2012-03-05: 5000 [IU] via INTRAVENOUS
  Administered 2012-03-05: 10000 [IU] via INTRAVENOUS

## 2012-03-05 MED ORDER — PHENYLEPHRINE HCL 10 MG/ML IJ SOLN
INTRAMUSCULAR | Status: DC | PRN
  Administered 2012-03-05 (×2): 80 ug via INTRAVENOUS
  Administered 2012-03-05: 160 ug via INTRAVENOUS
  Administered 2012-03-05: 80 ug via INTRAVENOUS

## 2012-03-05 MED ORDER — MORPHINE SULFATE 2 MG/ML IJ SOLN
2.0000 mg | INTRAMUSCULAR | Status: DC | PRN
  Administered 2012-03-06: 2 mg via INTRAVENOUS
  Filled 2012-03-05: qty 1

## 2012-03-05 MED ORDER — DOCUSATE SODIUM 100 MG PO CAPS
200.0000 mg | ORAL_CAPSULE | Freq: Every day | ORAL | Status: DC
  Administered 2012-03-06 – 2012-03-21 (×14): 200 mg via ORAL
  Filled 2012-03-05 (×13): qty 2

## 2012-03-05 MED ORDER — ALPRAZOLAM 0.25 MG PO TABS
0.2500 mg | ORAL_TABLET | Freq: Once | ORAL | Status: AC
  Administered 2012-03-05: 0.25 mg via ORAL
  Filled 2012-03-05: qty 1

## 2012-03-05 MED ORDER — ACETAMINOPHEN 160 MG/5ML PO SOLN: 650.0000 mg | ORAL | Status: AC

## 2012-03-05 MED ORDER — PROTAMINE SULFATE 10 MG/ML IV SOLN
INTRAVENOUS | Status: DC | PRN
  Administered 2012-03-05: 50 mg via INTRAVENOUS
  Administered 2012-03-05: 100 mg via INTRAVENOUS
  Administered 2012-03-05 (×2): 50 mg via INTRAVENOUS
  Administered 2012-03-05: 10 mg via INTRAVENOUS
  Administered 2012-03-05: 40 mg via INTRAVENOUS
  Administered 2012-03-05: 100 mg via INTRAVENOUS

## 2012-03-05 MED ORDER — MIDAZOLAM HCL 2 MG/2ML IJ SOLN: 2.0000 mg | INTRAMUSCULAR | Status: DC | PRN

## 2012-03-05 MED ORDER — ASPIRIN 81 MG PO CHEW: 324.0000 mg | CHEWABLE_TABLET | Freq: Every day | ORAL | Status: DC

## 2012-03-05 MED ORDER — SODIUM CHLORIDE 0.9 % IV SOLN
1.5000 mg/kg/h | INTRAVENOUS | Status: DC
  Filled 2012-03-05: qty 25

## 2012-03-05 MED ORDER — VECURONIUM BROMIDE 10 MG IV SOLR
INTRAVENOUS | Status: DC | PRN
  Administered 2012-03-05 (×3): 5 mg via INTRAVENOUS

## 2012-03-05 MED ORDER — HEMOSTATIC AGENTS (NO CHARGE) OPTIME
TOPICAL | Status: DC | PRN
  Administered 2012-03-05: 1 via TOPICAL

## 2012-03-05 MED ORDER — AMIODARONE LOAD VIA INFUSION
150.0000 mg | Freq: Once | INTRAVENOUS | Status: AC
  Administered 2012-03-05: 150 mg via INTRAVENOUS
  Filled 2012-03-05: qty 83.34

## 2012-03-05 MED ORDER — PANTOPRAZOLE SODIUM 40 MG PO TBEC
40.0000 mg | DELAYED_RELEASE_TABLET | Freq: Every day | ORAL | Status: DC
  Administered 2012-03-07 – 2012-03-27 (×21): 40 mg via ORAL
  Filled 2012-03-05 (×20): qty 1

## 2012-03-05 MED ORDER — AMIODARONE HCL IN DEXTROSE 360-4.14 MG/200ML-% IV SOLN
1.0000 mg/min | INTRAVENOUS | Status: AC
  Administered 2012-03-05: 0.999 mg/min via INTRAVENOUS
  Administered 2012-03-05: 1 mg/min via INTRAVENOUS
  Filled 2012-03-05: qty 200

## 2012-03-05 MED ORDER — ACETAMINOPHEN 500 MG PO TABS
1000.0000 mg | ORAL_TABLET | Freq: Four times a day (QID) | ORAL | Status: AC
  Administered 2012-03-06 – 2012-03-10 (×15): 1000 mg via ORAL
  Filled 2012-03-05 (×17): qty 2

## 2012-03-05 MED ORDER — SODIUM CHLORIDE 0.9 % IV SOLN
INTRAVENOUS | Status: DC
  Administered 2012-03-08 – 2012-03-20 (×2): via INTRAVENOUS

## 2012-03-05 MED ORDER — NITROGLYCERIN IN D5W 200-5 MCG/ML-% IV SOLN
5.0000 ug/min | INTRAVENOUS | Status: DC
  Administered 2012-03-05: 5 ug/min via INTRAVENOUS

## 2012-03-05 MED ORDER — SODIUM BICARBONATE 8.4 % IV SOLN
50.0000 meq | Freq: Once | INTRAVENOUS | Status: AC
  Administered 2012-03-05: 50 meq via INTRAVENOUS

## 2012-03-05 MED ORDER — MIDAZOLAM HCL 5 MG/5ML IJ SOLN
INTRAMUSCULAR | Status: DC | PRN
  Administered 2012-03-05: 1 mg via INTRAVENOUS
  Administered 2012-03-05: 2 mg via INTRAVENOUS

## 2012-03-05 MED ORDER — LACTATED RINGERS IV SOLN: INTRAVENOUS | Status: DC

## 2012-03-05 MED ORDER — DEXMEDETOMIDINE HCL IN NACL 400 MCG/100ML IV SOLN
0.1000 ug/kg/h | INTRAVENOUS | Status: DC
  Filled 2012-03-05: qty 100

## 2012-03-05 MED ORDER — AMIODARONE HCL IN DEXTROSE 360-4.14 MG/200ML-% IV SOLN
INTRAVENOUS | Status: AC
  Administered 2012-03-05: 0.999 mg/min via INTRAVENOUS
  Filled 2012-03-05: qty 200

## 2012-03-05 MED ORDER — ALBUMIN HUMAN 5 % IV SOLN
INTRAVENOUS | Status: DC | PRN
  Administered 2012-03-05: 13:00:00 via INTRAVENOUS

## 2012-03-05 MED ORDER — 0.9 % SODIUM CHLORIDE (POUR BTL) OPTIME
TOPICAL | Status: DC | PRN
  Administered 2012-03-05: 5000 mL

## 2012-03-05 MED ORDER — POTASSIUM CHLORIDE 10 MEQ/50ML IV SOLN: 10.0000 meq | INTRAVENOUS | Status: AC

## 2012-03-05 MED ORDER — SODIUM CHLORIDE 0.9 % IJ SOLN
3.0000 mL | INTRAMUSCULAR | Status: DC | PRN
  Administered 2012-03-10 – 2012-03-11 (×2): 3 mL via INTRAVENOUS
  Administered 2012-03-20: 30 mL via INTRAVENOUS

## 2012-03-05 MED ORDER — NITROGLYCERIN IN D5W 200-5 MCG/ML-% IV SOLN
INTRAVENOUS | Status: AC
  Administered 2012-03-05: 5 ug/min via INTRAVENOUS
  Filled 2012-03-05: qty 250

## 2012-03-05 MED ORDER — FAMOTIDINE IN NACL 20-0.9 MG/50ML-% IV SOLN
20.0000 mg | Freq: Two times a day (BID) | INTRAVENOUS | Status: AC
  Administered 2012-03-05 (×2): 20 mg via INTRAVENOUS
  Filled 2012-03-05: qty 50

## 2012-03-05 MED ORDER — DEXMEDETOMIDINE HCL IN NACL 200 MCG/50ML IV SOLN: 0.1000 ug/kg/h | INTRAVENOUS | Status: DC

## 2012-03-05 MED FILL — Magnesium Sulfate Inj 50%: INTRAMUSCULAR | Qty: 2 | Status: AC

## 2012-03-05 MED FILL — Potassium Chloride Inj 2 mEq/ML: INTRAVENOUS | Qty: 40 | Status: AC

## 2012-03-05 SURGICAL SUPPLY — 117 items
ADH SKN CLS APL DERMABOND .7 (GAUZE/BANDAGES/DRESSINGS) ×1
ATTRACTOMAT 16X20 MAGNETIC DRP (DRAPES) ×2 IMPLANT
BAG DECANTER FOR FLEXI CONT (MISCELLANEOUS) ×2 IMPLANT
BANDAGE ELASTIC 4 VELCRO ST LF (GAUZE/BANDAGES/DRESSINGS) ×2 IMPLANT
BANDAGE ELASTIC 6 VELCRO ST LF (GAUZE/BANDAGES/DRESSINGS) ×2 IMPLANT
BANDAGE GAUZE ELAST BULKY 4 IN (GAUZE/BANDAGES/DRESSINGS) ×2 IMPLANT
BLADE STERNUM SYSTEM 6 (BLADE) ×2 IMPLANT
BLADE SURG 11 STRL SS (BLADE) ×1 IMPLANT
BLADE SURG ROTATE 9660 (MISCELLANEOUS) IMPLANT
CANISTER SUCTION 2500CC (MISCELLANEOUS) ×2 IMPLANT
CANN PRFSN .5XCNCT 15X34-48 (MISCELLANEOUS) ×1
CANNULA AORTIC HI-FLOW 6.5M20F (CANNULA) ×2 IMPLANT
CANNULA PRFSN .5XCNCT 15X34-48 (MISCELLANEOUS) ×1 IMPLANT
CANNULA VEN 2 STAGE (MISCELLANEOUS) ×2
CATH CPB KIT GERHARDT (MISCELLANEOUS) ×2 IMPLANT
CATH THORACIC 28FR (CATHETERS) ×2 IMPLANT
CATH THORACIC 36FR (CATHETERS) IMPLANT
CATH THORACIC 36FR RT ANG (CATHETERS) IMPLANT
CLIP RETRACTION 3.0MM CORONARY (MISCELLANEOUS) ×1 IMPLANT
CLIP TI MEDIUM 24 (CLIP) IMPLANT
CLIP TI WIDE RED SMALL 24 (CLIP) IMPLANT
CLOTH BEACON ORANGE TIMEOUT ST (SAFETY) ×2 IMPLANT
COVER SURGICAL LIGHT HANDLE (MISCELLANEOUS) ×3 IMPLANT
CRADLE DONUT ADULT HEAD (MISCELLANEOUS) ×2 IMPLANT
DERMABOND ADVANCED (GAUZE/BANDAGES/DRESSINGS) ×1
DERMABOND ADVANCED .7 DNX12 (GAUZE/BANDAGES/DRESSINGS) IMPLANT
DRAIN CHANNEL 28F RND 3/8 FF (WOUND CARE) ×2 IMPLANT
DRAIN CHANNEL 32F RND 10.7 FF (WOUND CARE) IMPLANT
DRAPE CARDIOVASCULAR INCISE (DRAPES) ×2
DRAPE SLUSH MACHINE 52X66 (DRAPES) IMPLANT
DRAPE SLUSH/WARMER DISC (DRAPES) ×1 IMPLANT
DRAPE SRG 135X102X78XABS (DRAPES) ×1 IMPLANT
DRSG COVADERM 4X14 (GAUZE/BANDAGES/DRESSINGS) ×2 IMPLANT
ELECT BLADE 4.0 EZ CLEAN MEGAD (MISCELLANEOUS) ×4
ELECT CAUTERY BLADE 6.4 (BLADE) ×2 IMPLANT
ELECT REM PT RETURN 9FT ADLT (ELECTROSURGICAL) ×4
ELECTRODE BLDE 4.0 EZ CLN MEGD (MISCELLANEOUS) ×1 IMPLANT
ELECTRODE REM PT RTRN 9FT ADLT (ELECTROSURGICAL) ×2 IMPLANT
GLOVE BIO SURGEON STRL SZ 6 (GLOVE) ×3 IMPLANT
GLOVE BIO SURGEON STRL SZ 6.5 (GLOVE) ×9 IMPLANT
GLOVE BIO SURGEON STRL SZ7 (GLOVE) IMPLANT
GLOVE BIO SURGEON STRL SZ7.5 (GLOVE) IMPLANT
GLOVE BIOGEL PI IND STRL 6 (GLOVE) IMPLANT
GLOVE BIOGEL PI IND STRL 6.5 (GLOVE) IMPLANT
GLOVE BIOGEL PI IND STRL 7.0 (GLOVE) IMPLANT
GLOVE BIOGEL PI INDICATOR 6 (GLOVE) ×2
GLOVE BIOGEL PI INDICATOR 6.5 (GLOVE) ×2
GLOVE BIOGEL PI INDICATOR 7.0 (GLOVE) ×3
GLOVE EUDERMIC 7 POWDERFREE (GLOVE) IMPLANT
GLOVE ORTHO TXT STRL SZ7.5 (GLOVE) IMPLANT
GOWN STRL NON-REIN LRG LVL3 (GOWN DISPOSABLE) ×11 IMPLANT
HEMOSTAT POWDER SURGIFOAM 1G (HEMOSTASIS) ×6 IMPLANT
HEMOSTAT SURGICEL 2X14 (HEMOSTASIS) ×2 IMPLANT
INSERT FOGARTY 61MM (MISCELLANEOUS) IMPLANT
INSERT FOGARTY XLG (MISCELLANEOUS) IMPLANT
KIT BASIN OR (CUSTOM PROCEDURE TRAY) ×2 IMPLANT
KIT ROOM TURNOVER OR (KITS) ×2 IMPLANT
KIT SUCTION CATH 14FR (SUCTIONS) ×4 IMPLANT
KIT VASOVIEW W/TROCAR VH 2000 (KITS) ×2 IMPLANT
LEAD PACING MYOCARDI (MISCELLANEOUS) ×2 IMPLANT
MARKER GRAFT CORONARY BYPASS (MISCELLANEOUS) ×6 IMPLANT
NS IRRIG 1000ML POUR BTL (IV SOLUTION) ×10 IMPLANT
PACK OPEN HEART (CUSTOM PROCEDURE TRAY) ×2 IMPLANT
PAD ARMBOARD 7.5X6 YLW CONV (MISCELLANEOUS) ×4 IMPLANT
PENCIL BUTTON HOLSTER BLD 10FT (ELECTRODE) ×4 IMPLANT
PUNCH AORTIC ROTATE 4.0MM (MISCELLANEOUS) IMPLANT
PUNCH AORTIC ROTATE 4.5MM 8IN (MISCELLANEOUS) ×1 IMPLANT
PUNCH AORTIC ROTATE 5MM 8IN (MISCELLANEOUS) IMPLANT
SET CARDIOPLEGIA MPS 5001102 (MISCELLANEOUS) ×1 IMPLANT
SOLUTION ANTI FOG 6CC (MISCELLANEOUS) ×1 IMPLANT
SPONGE GAUZE 4X4 12PLY (GAUZE/BANDAGES/DRESSINGS) ×3 IMPLANT
SPONGE LAP 18X18 X RAY DECT (DISPOSABLE) ×3 IMPLANT
SPONGE LAP 4X18 X RAY DECT (DISPOSABLE) IMPLANT
SUT BONE WAX W31G (SUTURE) ×2 IMPLANT
SUT ETHILON 3 0 FSL (SUTURE) ×1 IMPLANT
SUT MNCRL AB 4-0 PS2 18 (SUTURE) ×2 IMPLANT
SUT PROLENE 3 0 SH DA (SUTURE) ×2 IMPLANT
SUT PROLENE 3 0 SH1 36 (SUTURE) ×2 IMPLANT
SUT PROLENE 4 0 RB 1 (SUTURE) ×2
SUT PROLENE 4 0 SH DA (SUTURE) IMPLANT
SUT PROLENE 4 0 TF (SUTURE) ×4 IMPLANT
SUT PROLENE 4-0 RB1 .5 CRCL 36 (SUTURE) IMPLANT
SUT PROLENE 5 0 C 1 36 (SUTURE) IMPLANT
SUT PROLENE 6 0 C 1 30 (SUTURE) ×4 IMPLANT
SUT PROLENE 6 0 CC (SUTURE) ×6 IMPLANT
SUT PROLENE 7 0 BV 1 (SUTURE) IMPLANT
SUT PROLENE 7 0 BV1 MDA (SUTURE) ×3 IMPLANT
SUT PROLENE 7.0 RB 3 (SUTURE) IMPLANT
SUT PROLENE 8 0 BV175 6 (SUTURE) ×3 IMPLANT
SUT SILK  1 MH (SUTURE)
SUT SILK 1 MH (SUTURE) IMPLANT
SUT SILK 2 0 SH CR/8 (SUTURE) IMPLANT
SUT SILK 3 0 SH CR/8 (SUTURE) IMPLANT
SUT STEEL 6MS V (SUTURE) ×2 IMPLANT
SUT STEEL STERNAL CCS#1 18IN (SUTURE) IMPLANT
SUT STEEL SZ 6 DBL 3X14 BALL (SUTURE) ×2 IMPLANT
SUT VIC AB 1 CTX 18 (SUTURE) ×4 IMPLANT
SUT VIC AB 1 CTX 36 (SUTURE)
SUT VIC AB 1 CTX36XBRD ANBCTR (SUTURE) IMPLANT
SUT VIC AB 2-0 CT1 27 (SUTURE) ×2
SUT VIC AB 2-0 CT1 TAPERPNT 27 (SUTURE) IMPLANT
SUT VIC AB 2-0 CTX 27 (SUTURE) IMPLANT
SUT VIC AB 3-0 SH 27 (SUTURE)
SUT VIC AB 3-0 SH 27X BRD (SUTURE) IMPLANT
SUT VIC AB 3-0 X1 27 (SUTURE) IMPLANT
SUT VICRYL 4-0 PS2 18IN ABS (SUTURE) IMPLANT
SUTURE E-PAK OPEN HEART (SUTURE) ×2 IMPLANT
SYSTEM SAHARA CHEST DRAIN ATS (WOUND CARE) ×2 IMPLANT
TAPE CLOTH SURG 4X10 WHT LF (GAUZE/BANDAGES/DRESSINGS) ×1 IMPLANT
TOWEL OR 17X24 6PK STRL BLUE (TOWEL DISPOSABLE) ×4 IMPLANT
TOWEL OR 17X26 10 PK STRL BLUE (TOWEL DISPOSABLE) ×4 IMPLANT
TRAY FOLEY IC TEMP SENS 14FR (CATHETERS) ×2 IMPLANT
TUBE FEEDING 8FR 16IN STR KANG (MISCELLANEOUS) ×2 IMPLANT
TUBE SUCT INTRACARD DLP 20F (MISCELLANEOUS) ×2 IMPLANT
TUBING INSUFFLATION 10FT LAP (TUBING) ×3 IMPLANT
UNDERPAD 30X30 INCONTINENT (UNDERPADS AND DIAPERS) ×2 IMPLANT
WATER STERILE IRR 1000ML POUR (IV SOLUTION) ×4 IMPLANT

## 2012-03-05 NOTE — Progress Notes (Signed)
Pt ambulated to bathroom with assist; pt got up to wash hands and became pale, diaphretic and almost fell; was able to hold pt until lift arrived; CBG checked = 390; got pt back to bed; pt able to follow commands; BP 116/66 HR 135 but is trending downward back to low 100s; EKG obtained; changes noted in leads v1-v6 with ST Depression which is abnormal from original EKG; Dr. Donnie Aho paged; page returned with new order for low dose NTG to be started and update cardiac thoracic surgery physician on call; Dr. Dorris Fetch called and updated; will keep pt in bed and monitor closely; will prep for surgery

## 2012-03-05 NOTE — Progress Notes (Signed)
Pt placed back on SIMV 600 40% 5 4 PS 10 per ABG. RT will reassess ability to extubate and continue to monitor.

## 2012-03-05 NOTE — Transfer of Care (Signed)
Immediate Anesthesia Transfer of Care Note  Patient: [name]  Procedure(s) Performed: Procedure(s) (LRB) with comments: CORONARY ARTERY BYPASS GRAFTING (CABG) (N/A)  Patient Location: SICU  Anesthesia Type: General  Level of Consciousness: sedated and Patient remains intubated per anesthesia plan  Airway & Oxygen Therapy: Patient remains intubated per anesthesia plan  Post-op Assessment: Report given to PACU RN and Post -op Vital signs reviewed and stable  Post vital signs: Reviewed and stable  Complications: No apparent anesthesia complications

## 2012-03-05 NOTE — Progress Notes (Signed)
TCTS BRIEF SICU PROGRESS NOTE  Day of Surgery  S/P Procedure(s) (LRB): CORONARY ARTERY BYPASS GRAFTING (CABG) (N/A)   Slow to wake up but arousable on vent Stable hemodynamics Chest tube output low UOP adequate Labs okay with Hgb 10.4  Plan: Continue routine early postop with serial Hgb/Hct Wean vent once more alert  [name],[name] 03/05/2012 8:03 PM

## 2012-03-05 NOTE — Progress Notes (Signed)
Low dose NTG gtt started @ 96mcg/min @ 0355; shave and prep for surgery completed.

## 2012-03-05 NOTE — Progress Notes (Signed)
  Echocardiogram Echocardiogram Transesophageal has been performed.  NADEEM, VAHLE 03/05/2012, 9:20 AM

## 2012-03-05 NOTE — Progress Notes (Signed)
Pt has been taking Tylenol 650mg  PO for left hip/joint pain; pt continues to complain severe pain in that area; denies chest pain; pt  States pain has gotten worse and would like "a shot in the hip" to make the pain go away; Dr. Donnie Aho paged to discuss options; Dr. Donnie Aho returned page; order for Xanax 0.25mg  PO once given to help pt relax; no further pain medications order due to the fact pt will be going to surgery for CABG in a few hours;  Will continue to access

## 2012-03-05 NOTE — Progress Notes (Signed)
  Amiodarone Drug - Drug Interaction Consult Note  Recommendations: Amiodarone is metabolized by the cytochrome P450 system and therefore has the potential to cause many drug interactions. Amiodarone has an average plasma half-life of 50 days (range 20 to 100 days).   There is potential for drug interactions to occur several weeks or months after stopping treatment and the onset of drug interactions may be slow after initiating amiodarone.   [x]  Statins: Increased risk of myopathy.  Other statins: counsel patients to report any muscle pain or weakness immediately.  Atorvastatin  []  Anticoagulants: Amiodarone can increase anticoagulant effect. Consider warfarin dose reduction. Patients should be monitored closely and the dose of anticoagulant altered accordingly, remembering that amiodarone levels take several weeks to stabilize.  []  Antiepileptics: Amiodarone can increase plasma concentration of phenytoin, phenytoin dose should be reduced. Note that small changes in phenytoin dose can result in large changes in phenytoin levels. Monitor patient closely and counsel on signs of toxicity.  [x]  Beta blockers: increased risk of bradycardia, AV block and myocardial depression. Sotalol - avoid concomitant use. Metoprolol  []   Calcium channel blockers (diltiazem and verapamil): increased risk of bradycardia, AV block and myocardial depression.  []   Cyclosporine: Amiodarone increases levels of cyclosporine. Reduced dose of cyclosporine is recommended.  []  Digoxin dose should be halved when amiodarone is started.  [x]  Diuretics: increased risk of cardiotoxicity if hypokalemia occurs. Furosemide  []  Oral hypoglycemic agents (glyburide, glipizide, glimepiride): increased risk of hypoglycemia. Patient's glucose levels should be monitored closely when initiating amiodarone therapy.   [x]  Drugs that prolong the QT interval: Concurrent therapy is contraindicated due to the increased risk of torsades de  pointes; . Antibiotics: e.g. fluoroquinolones, erythromycin. . Antiarrhythmics: e.g. quinidine, procainamide, disopyramide, sotalol. . Antipsychotics: Risperdal  . Lithium, tricyclic antidepressants, and methadone. Thank You,  Madolyn Frieze  03/05/2012 4:55 PM

## 2012-03-05 NOTE — Anesthesia Preprocedure Evaluation (Signed)
Anesthesia Evaluation  Patient identified by MRN, date of birth, ID band Patient awake    Reviewed: Allergy & Precautions, H&P , NPO status , Patient's Chart, lab work & pertinent test results  Airway Mallampati: II  Neck ROM: full    Dental   Pulmonary shortness of breath, COPDformer smoker,          Cardiovascular hypertension, + CAD, + Past MI and +CHF     Neuro/Psych  Headaches, Depression    GI/Hepatic GERD-  ,  Endo/Other  diabetes, Type 2obese  Renal/GU Renal InsufficiencyRenal disease     Musculoskeletal  (+) Arthritis -,   Abdominal   Peds  Hematology   Anesthesia Other Findings   Reproductive/Obstetrics                           Anesthesia Physical Anesthesia Plan  ASA: III  Anesthesia Plan: General   Post-op Pain Management:    Induction: Intravenous  Airway Management Planned: Oral ETT  Additional Equipment: Arterial line, CVP and PA Cath  Intra-op Plan:   Post-operative Plan: Post-operative intubation/ventilation  Informed Consent: I have reviewed the patients History and Physical, chart, labs and discussed the procedure including the risks, benefits and alternatives for the proposed anesthesia with the patient or authorized representative who has indicated his/her understanding and acceptance.     Plan Discussed with: CRNA and Surgeon  Anesthesia Plan Comments:         Anesthesia Quick Evaluation

## 2012-03-05 NOTE — Preoperative (Signed)
Beta Blockers   Reason not to administer Beta Blockers:Not Applicable, took at 0521 this AM

## 2012-03-05 NOTE — Brief Op Note (Addendum)
02/27/2012 - 03/05/2012  11:47 AM  PATIENT:  [name]  71 y.o. male  PRE-OPERATIVE DIAGNOSIS:  Cad with left main  POST-OPERATIVE DIAGNOSIS:  Same and suspect 5-7 unit retroperitoneal bleed since yesterday   PROCEDURE:  Procedure(s) (LRB) with comments:  CORONARY ARTERY BYPASS GRAFTING x4  LIMA to LAd  SVG to Diagonal  Sequential SVG to OM and Distal Left Circumflex  ENDOSCOPIC SAPHENOUS VEIN HARVEST RIGHT AND LEFT THIGH  SURGEON:  Surgeon(s) and Role:    * [name], MD - Primary  PHYSICIAN ASSISTANT: Erin Barrett PA-C  ANESTHESIA:   general  EBL:  Total I/O In: 2700 [I.V.:2000; Blood:700] Out: 400 [Urine:400]  BLOOD ADMINISTERED: 7 Packs CC PRBC,  CC CELLSAVER and 1 pack FFP  DRAINS: Left pleural chest tube, mediastinal chest tubes    LOCAL MEDICATIONS USED:  NONE  SPECIMEN:  No Specimen  DISPOSITION OF SPECIMEN:  N/A  COUNTS:  YES   DICTATION: .Dragon Dictation and Other Dictation: Dictation Number   PLAN OF CARE: Admit to inpatient   PATIENT DISPOSITION:  ICU - intubated and hemodynamically stable.   Delay start of Pharmacological VTE agent (>24hrs) due to surgical blood loss or risk of bleeding: yes

## 2012-03-06 ENCOUNTER — Encounter (HOSPITAL_COMMUNITY): Payer: Self-pay | Admitting: Family Medicine

## 2012-03-06 ENCOUNTER — Other Ambulatory Visit (HOSPITAL_COMMUNITY): Payer: Medicare Other

## 2012-03-06 ENCOUNTER — Inpatient Hospital Stay (HOSPITAL_COMMUNITY): Payer: Medicare Other

## 2012-03-06 DIAGNOSIS — I724 Aneurysm of artery of lower extremity: Secondary | ICD-10-CM

## 2012-03-06 LAB — GLUCOSE, CAPILLARY
Glucose-Capillary: 108 mg/dL — ABNORMAL HIGH (ref 70–99)
Glucose-Capillary: 136 mg/dL — ABNORMAL HIGH (ref 70–99)
Glucose-Capillary: 180 mg/dL — ABNORMAL HIGH (ref 70–99)
Glucose-Capillary: 91 mg/dL (ref 70–99)

## 2012-03-06 LAB — HEMOGLOBIN AND HEMATOCRIT, BLOOD
HCT: 27.8 % — ABNORMAL LOW (ref 39.0–52.0)
HCT: 26.1 % — ABNORMAL LOW (ref 39.0–52.0)
Hemoglobin: 10 g/dL — ABNORMAL LOW (ref 13.0–17.0)
Hemoglobin: 8.8 g/dL — ABNORMAL LOW (ref 13.0–17.0)
Hemoglobin: 9.9 g/dL — ABNORMAL LOW (ref 13.0–17.0)

## 2012-03-06 LAB — CBC
HCT: 28.2 % — ABNORMAL LOW (ref 39.0–52.0)
Hemoglobin: 9.4 g/dL — ABNORMAL LOW (ref 13.0–17.0)
MCHC: 34.8 g/dL (ref 30.0–36.0)
MCHC: 34.1 g/dL (ref 30.0–36.0)
MCV: 83.7 fL (ref 78.0–100.0)
Platelets: 102 10*3/uL — ABNORMAL LOW (ref 150–400)
Platelets: 86 10*3/uL — ABNORMAL LOW (ref 150–400)
RDW: 18.7 % — ABNORMAL HIGH (ref 11.5–15.5)
RDW: 18.9 % — ABNORMAL HIGH (ref 11.5–15.5)

## 2012-03-06 LAB — BASIC METABOLIC PANEL
BUN: 34 mg/dL — ABNORMAL HIGH (ref 6–23)
BUN: 36 mg/dL — ABNORMAL HIGH (ref 6–23)
CO2: 22 mEq/L (ref 19–32)
Calcium: 7.4 mg/dL — ABNORMAL LOW (ref 8.4–10.5)
Calcium: 7.4 mg/dL — ABNORMAL LOW (ref 8.4–10.5)
Chloride: 99 mEq/L (ref 96–112)
Creatinine, Ser: 2.29 mg/dL — ABNORMAL HIGH (ref 0.50–1.35)
Creatinine, Ser: 2.51 mg/dL — ABNORMAL HIGH (ref 0.50–1.35)
GFR calc Af Amer: 31 mL/min — ABNORMAL LOW (ref >90)
GFR calc Af Amer: 28 mL/min — ABNORMAL LOW (ref >90)
GFR calc non Af Amer: 27 mL/min — ABNORMAL LOW (ref >90)
GFR calc non Af Amer: 24 mL/min — ABNORMAL LOW (ref >90)
Glucose, Bld: 200 mg/dL — ABNORMAL HIGH (ref 70–99)
Potassium: 4.3 mEq/L (ref 3.5–5.1)
Sodium: 131 mEq/L — ABNORMAL LOW (ref 135–145)

## 2012-03-06 LAB — POCT I-STAT, CHEM 8
BUN: 34 mg/dL — ABNORMAL HIGH (ref 6–23)
Creatinine, Ser: 2.7 mg/dL — ABNORMAL HIGH (ref 0.50–1.35)
Glucose, Bld: 192 mg/dL — ABNORMAL HIGH (ref 70–99)
Potassium: 4.6 mEq/L (ref 3.5–5.1)
Sodium: 137 mEq/L (ref 135–145)

## 2012-03-06 LAB — POCT I-STAT 3, ART BLOOD GAS (G3+)
Bicarbonate: 22.2 mEq/L (ref 20.0–24.0)
O2 Saturation: 95 %
Patient temperature: 37.7
TCO2: 24 mmol/L (ref 0–100)
pCO2 arterial: 46.4 mmHg — ABNORMAL HIGH (ref 35.0–45.0)
pCO2 arterial: 44.2 mmHg (ref 35.0–45.0)
pH, Arterial: 7.29 — ABNORMAL LOW (ref 7.350–7.450)
pH, Arterial: 7.318 — ABNORMAL LOW (ref 7.350–7.450)

## 2012-03-06 LAB — TSH: TSH: 2.856 u[IU]/mL (ref 0.350–4.500)

## 2012-03-06 LAB — MAGNESIUM: Magnesium: 1.7 mg/dL (ref 1.5–2.5)

## 2012-03-06 LAB — PREPARE PLATELET PHERESIS

## 2012-03-06 MED ORDER — INSULIN ASPART 100 UNIT/ML ~~LOC~~ SOLN
0.0000 [IU] | SUBCUTANEOUS | Status: DC
  Administered 2012-03-06: 8 [IU] via SUBCUTANEOUS
  Administered 2012-03-06: 4 [IU] via SUBCUTANEOUS
  Administered 2012-03-06: 2 [IU] via SUBCUTANEOUS
  Administered 2012-03-06: 4 [IU] via SUBCUTANEOUS
  Administered 2012-03-07: 2 [IU] via SUBCUTANEOUS
  Administered 2012-03-07: 4 [IU] via SUBCUTANEOUS
  Administered 2012-03-07 (×2): 8 [IU] via SUBCUTANEOUS
  Administered 2012-03-07: 2 [IU] via SUBCUTANEOUS

## 2012-03-06 MED ORDER — INSULIN ASPART 100 UNIT/ML ~~LOC~~ SOLN
0.0000 [IU] | SUBCUTANEOUS | Status: AC
  Administered 2012-03-06 (×2): 2 [IU] via SUBCUTANEOUS

## 2012-03-06 MED ORDER — FUROSEMIDE 10 MG/ML IJ SOLN
80.0000 mg | Freq: Once | INTRAMUSCULAR | Status: AC
  Administered 2012-03-06: 80 mg via INTRAVENOUS

## 2012-03-06 MED ORDER — INSULIN GLARGINE 100 UNIT/ML ~~LOC~~ SOLN
15.0000 [IU] | SUBCUTANEOUS | Status: DC
  Administered 2012-03-06 – 2012-03-07 (×2): 15 [IU] via SUBCUTANEOUS

## 2012-03-06 MED ORDER — LEVALBUTEROL HCL 0.63 MG/3ML IN NEBU
0.6300 mg | INHALATION_SOLUTION | Freq: Three times a day (TID) | RESPIRATORY_TRACT | Status: DC
  Administered 2012-03-06 – 2012-03-18 (×37): 0.63 mg via RESPIRATORY_TRACT
  Filled 2012-03-06 (×41): qty 3

## 2012-03-06 MED ORDER — SODIUM CHLORIDE 0.9 % IV SOLN
INTRAVENOUS | Status: DC
  Filled 2012-03-06: qty 1

## 2012-03-06 MED ORDER — INSULIN ASPART 100 UNIT/ML ~~LOC~~ SOLN
0.0000 [IU] | SUBCUTANEOUS | Status: DC
  Administered 2012-03-06: 2 [IU] via SUBCUTANEOUS

## 2012-03-06 MED ORDER — RACEPINEPHRINE HCL 2.25 % IN NEBU
INHALATION_SOLUTION | RESPIRATORY_TRACT | Status: AC
  Administered 2012-03-06: 0.5 mL
  Filled 2012-03-06: qty 0.5

## 2012-03-06 NOTE — Progress Notes (Addendum)
Inpatient Diabetes Program Recommendations  AACE/ADA: New Consensus Statement on Inpatient Glycemic Control (2013)  Target Ranges:  Prepandial:   less than 140 mg/dL      Peak postprandial:   less than 180 mg/dL (1-2 hours)      Critically ill patients:  140 - 180 mg/dL   Reason for Visit: Patient post-CABG and transitioned off insulin drip today.  According to medication reconciliation, patient was on Lantus 45 units bid prior to hospitalization and Lispro 3-20 units tid.  Patient received Lantus 15 units this morning when transitioned off insulin drip.  May need increase of Lantus based on home dose.  Also please check A1C to determine pre-hospitalization glycemic control.  Will follow.

## 2012-03-06 NOTE — Anesthesia Postprocedure Evaluation (Signed)
  Anesthesia Post-op Note  Patient: [name]  Procedure(s) Performed: Procedure(s) (LRB) with comments: CORONARY ARTERY BYPASS GRAFTING (CABG) (N/A)  Patient Location: ICU  Anesthesia Type: General  Level of Consciousness: sedated  Airway and Oxygen Therapy: Patient remains intubated per anesthesia plan  Post-op Pain: none  Post-op Assessment: Post-op Vital signs reviewed, Patient's Cardiovascular Status Stable and Respiratory Function Stable  Post-op Vital Signs: Reviewed and stable  Complications: No apparent anesthesia complications

## 2012-03-06 NOTE — Progress Notes (Signed)
Duplex imaging of the right groin is negative for pseudoaneurysm.  There appears to be a hematoma.

## 2012-03-06 NOTE — Evaluation (Signed)
Physical Therapy Evaluation Patient Details Name: [name] MRN: [id number] DOB: 22-Aug-1940 Today's Date: 03/06/2012 Time: 1132-1201 PT Time Calculation (min): 29 min  PT Assessment / Plan / Recommendation Clinical Impression  Pt underwent CABG 03/05/12.  Pt with very poor mobility post-op due to usual heavy reliance on arms which is now limited due to sternal precautions.  Wife mentioned she has had back surgery in the past so doubt she can provide physical assist pt will need.  Recommend CIR.    PT Assessment  Patient needs continued PT services    Follow Up Recommendations  Skilled nursing facility    Barriers to Discharge Decreased caregiver support      Equipment Recommendations  None recommended by PT    Recommendations for Other Services OT consult   Frequency Min 3X/week    Precautions / Restrictions Precautions Precautions: Sternal;Fall   Pertinent Vitals/Pain VSS      Mobility  Bed Mobility Bed Mobility: Sit to Supine;Supine to Sit Supine to Sit: 1: +2 Total assist;HOB elevated Supine to Sit: Patient Percentage: 30% Sit to Supine: 1: +2 Total assist;HOB flat Sit to Supine: Patient Percentage: 10% Details for Bed Mobility Assistance: assist to bring feet off and trunk up Transfers Transfer via Lift Equipment: [place] Details for Transfer Assistance: Attempted x 5 to stand with 2-3 person assist but pt unable.  Pt kept scooting out from bed and not going up.  Returned to supine and used [employer]    Exercises     PT Diagnosis: Difficulty walking;Generalized weakness  PT Problem List: Decreased strength;Decreased activity tolerance;Decreased mobility;Decreased knowledge of use of DME;Decreased knowledge of precautions;Obesity PT Treatment Interventions: DME instruction;Gait training;Functional mobility training;Therapeutic exercise;Cognitive remediation;Therapeutic activities;Patient/family education   PT Goals Acute Rehab PT Goals PT Goal Formulation:  Patient unable to participate in goal setting Time For Goal Achievement: 03/20/12 Potential to Achieve Goals: Fair Pt will go Supine/Side to Sit: with min assist PT Goal: Supine/Side to Sit - Progress: Goal set today Pt will go Sit to Supine/Side: with min assist PT Goal: Sit to Supine/Side - Progress: Goal set today Pt will go Sit to Stand: with mod assist PT Goal: Sit to Stand - Progress: Goal set today Pt will go Stand to Sit: with min assist PT Goal: Stand to Sit - Progress: Goal set today Pt will Transfer Bed to Chair/Chair to Bed: with mod assist PT Transfer Goal: Bed to Chair/Chair to Bed - Progress: Goal set today Pt will Ambulate: 16 - 50 feet;with min assist;with least restrictive assistive device PT Goal: Ambulate - Progress: Goal set today  Visit Information  Last PT Received On: 03/06/12 Assistance Needed: +3 or more    Subjective Data  Subjective: "Why can't I get up like normal?" pt asked when we explained he couldn't push up with his arms. Patient Stated Goal: Pt did not state.  Pt with very difficult time hearing anything he was asked.   Prior Functioning  Home Living Lives With: Spouse Available Help at Discharge: Family;Available 24 hours/day (wife has hx of back surgery, can't give physical assist) Type of Home: House Home Access: Ramped entrance Home Layout: One level Bathroom Shower/Tub: Engineer, manufacturing systems: Handicapped height Home Adaptive Equipment: Walker - four wheeled;Straight cane;Tub transfer bench Prior Function Level of Independence: Independent with assistive device(s) (amb with straight cane) Vocation: Retired Musician: HOH    Cognition  Overall Cognitive Status: Difficult to assess Difficult to assess due to: Hard of hearing/deaf Arousal/Alertness: Awake/alert  Extremity/Trunk Assessment Right Lower Extremity Assessment RLE ROM/Strength/Tone: Deficits RLE ROM/Strength/Tone Deficits: functionally  <3/5 Left Lower Extremity Assessment LLE ROM/Strength/Tone: Deficits LLE ROM/Strength/Tone Deficits: functionally <3/5   Balance Static Sitting Balance Static Sitting - Balance Support: Bilateral upper extremity supported Static Sitting - Level of Assistance: 5: Stand by assistance Static Sitting - Comment/# of Minutes: 10 minutes while attempting to stand  End of Session PT - End of Session Activity Tolerance: Patient limited by fatigue Patient left: in chair;with call bell/phone within reach;with nursing in room;with family/visitor present Nurse Communication: Need for lift equipment;Mobility status  GP     Ted Goodner 03/06/2012, 12:18 PM  [facility] PT [phone]

## 2012-03-06 NOTE — Progress Notes (Signed)
Patient placed back on SIMV 10, Vt 600, 40% +5 PEEP, 10 PS per ABG result.  Will monitor and reattempt wean when appropriate.

## 2012-03-06 NOTE — Progress Notes (Signed)
Pt given one dose of Racemic Epinephrine for stridor after extubation. Pt tolerated well. Stridor sounds decreased after treatment. RT will continue to monitor.

## 2012-03-06 NOTE — Plan of Care (Signed)
Problem: Phase II Progression Outcomes Goal: Patient extubated within - Outcome: Completed/Met Date Met:  03/06/12 Extubated after 12 hours

## 2012-03-06 NOTE — Progress Notes (Signed)
Patient ID: [name], male   DOB: [birth year], 71 y.o.   MRN: [id number]                   [address]            ,[place] [address]          [phone]     1 Day Post-Op Procedure(s) (LRB): CORONARY ARTERY BYPASS GRAFTING (CABG) (N/A)  Total Length of Stay:  LOS: 8 days  BP 119/63  Pulse 99  Temp 98.5 F (36.9 C) (Oral)  Resp 24  Ht 5\' 10"  (1.778 m)  Wt 288 lb 5.8 oz (130.8 kg)  BMI 41.38 kg/m2  SpO2 99%     . sodium chloride 20 mL/hr at 03/05/12 1430  . sodium chloride 50 mL/hr at 03/05/12 1400  . sodium chloride    . amiodarone (NEXTERONE PREMIX) 360 mg/200 mL dextrose 1 mg/min (03/05/12 1958)   Followed by  . amiodarone (NEXTERONE PREMIX) 360 mg/200 mL dextrose 0.5 mg/min (03/06/12 0408)  . dexmedetomidine Stopped (03/05/12 1700)  . DOPamine 3 mcg/kg/min (03/05/12 1900)  . lactated ringers    . nitroGLYCERIN Stopped (03/05/12 1400)  . phenylephrine (NEO-SYNEPHRINE) Adult infusion Stopped (03/06/12 1100)  . DISCONTD: insulin (NOVOLIN-R) infusion 1.3 mL/hr at 03/05/12 2021  . DISCONTD: insulin (NOVOLIN-R) infusion       Lab Results  Component Value Date   WBC 13.8* 03/06/2012   HGB 9.4* 03/06/2012   HCT 27.6* 03/06/2012   PLT 86* 03/06/2012   GLUCOSE 146* 03/06/2012   ALT 24 03/05/2012   AST 38* 03/05/2012   NA 136 03/06/2012   K 4.7 03/06/2012   CL 105 03/06/2012   CREATININE 2.29* 03/06/2012   BUN 34* 03/06/2012   CO2 23 03/06/2012   TSH 2.856 03/05/2012   INR 1.47 03/05/2012   Cr rising Tolerated extubation after  Wheezing when first extubated Glucose 146 up to 201 at 4pm  [name] MD  Beeper [phone] Office [phone] 03/06/2012 4:56 PM

## 2012-03-06 NOTE — Progress Notes (Signed)
Patient ID: ANTAEUS Patrick, male   DOB: May 29, 1941, 71 y.o.   MRN: [id number] TCTS DAILY PROGRESS NOTE                   [address]            [place] [address]          [phone]      1 Day Post-Op Procedure(s) (LRB): CORONARY ARTERY BYPASS GRAFTING (CABG) (N/A)  Total Length of Stay:  LOS: 8 days   Subjective: Awake and wants et tube out  Objective: Vital signs in last 24 hours: Temp:  [97.3 F (36.3 C)-99.9 F (37.7 C)] 99.9 F (37.7 C) (09/17 0700) Pulse Rate:  [68-101] 98  (09/17 0700) Cardiac Rhythm:  [-] Sinus tachycardia (09/17 0600) Resp:  [12-23] 14  (09/17 0700) BP: (83-116)/(44-72) 114/53 mmHg (09/17 0600) SpO2:  [96 %-100 %] 98 % (09/17 0700) Arterial Line BP: (86-150)/(48-66) 137/61 mmHg (09/17 0700) FiO2 (%):  [40 %-50 %] 40 % (09/17 0645) Weight:  [288 lb 5.8 oz (130.8 kg)] 288 lb 5.8 oz (130.8 kg) (09/17 0500)  Filed Weights   02/27/12 1100 02/28/12 0916 03/06/12 0500  Weight: 255 lb (115.667 kg) 255 lb 8.2 oz (115.9 kg) 288 lb 5.8 oz (130.8 kg)    Weight change:    Hemodynamic parameters for last 24 hours: PAP: (27-39)/(18-26) 35/24 mmHg CO:  [4.3 L/min-6.6 L/min] 6.6 L/min CI:  [1.9 L/min/m2-2.9 L/min/m2] 2.9 L/min/m2  Intake/Output from previous day: 09/16 0701 - 09/17 0700 In: 7980.3 [I.V.:5350.3; Blood:1600; NG/GT:30; IV Piggyback:1000] Out: 2940 [Urine:2590; Chest Tube:350]  Intake/Output this shift:    Current Meds: Scheduled Meds:   . acetaminophen (TYLENOL) oral liquid 160 mg/5 mL  650 mg Per Tube NOW   Or  . acetaminophen  650 mg Rectal NOW  . acetaminophen  1,000 mg Oral Q6H   Or  . acetaminophen (TYLENOL) oral liquid 160 mg/5 mL  975 mg Per Tube Q6H  . amiodarone  150 mg Intravenous Once  . aspirin EC  325 mg Oral Daily   Or  . aspirin  324 mg Per Tube Daily  . atorvastatin  80 mg Oral q1800  . bisacodyl  10 mg Oral Daily   Or  . bisacodyl  10 mg Rectal Daily  . brimonidine  1 drop Left Eye BID  .  cefUROXime (ZINACEF)  IV  1.5 g Intravenous To OR  . cefUROXime (ZINACEF)  IV  1.5 g Intravenous Q12H  . dexmedetomidine  0.1-0.7 mcg/kg/hr Intravenous To OR  . dextrose      . docusate sodium  200 mg Oral Daily  . DOPamine  2-20 mcg/kg/min Intravenous To OR  . dorzolamide-timolol  1 drop Left Eye BID  . erythromycin  1 application Right Eye BID  . ezetimibe  10 mg Oral Daily  . famotidine (PEPCID) IV  20 mg Intravenous Q12H  . insulin aspart  0-24 Units Subcutaneous Q2H   Followed by  . insulin aspart  0-24 Units Subcutaneous Q4H  . insulin (NOVOLIN-R) infusion   Intravenous To OR  . insulin regular  0-10 Units Intravenous TID WC  . ipratropium  1 spray Nasal BID  . loteprednol  1 drop Right Eye BID  . magnesium sulfate  4 g Intravenous Once  . metoprolol tartrate  12.5 mg Oral BID   Or  . metoprolol tartrate  12.5 mg Per Tube BID  . nitroGLYCERIN  2-200 mcg/min Intravenous To OR  .  nitroglycerin-nicardipine-HEPARIN-sodium bicarbonate irrigation for artery spasm   Irrigation To OR  . pantoprazole  40 mg Oral Q1200  . phenylephrine (NEO-SYNEPHRINE) Adult infusion  30-200 mcg/min Intravenous To OR  . polyvinyl alcohol  1 drop Both Eyes BID  . potassium chloride  10 mEq Intravenous Q1 Hr x 3  . sodium bicarbonate  50 mEq Intravenous Once  . sodium chloride  3 mL Intravenous Q12H  . tranexamic acid  15 mg/kg Intravenous To OR  . tranexamic acid (CYKLOKAPRON) infusion ([facility])  1.5 mg/kg/hr Intravenous To OR  . vancomycin  1,000 mg Intravenous Once   Continuous Infusions:   . sodium chloride 20 mL/hr at 03/05/12 1430  . sodium chloride 50 mL/hr at 03/05/12 1400  . sodium chloride    . amiodarone (NEXTERONE PREMIX) 360 mg/200 mL dextrose 1 mg/min (03/05/12 1958)   Followed by  . amiodarone (NEXTERONE PREMIX) 360 mg/200 mL dextrose 0.5 mg/min (03/06/12 0408)  . dexmedetomidine Stopped (03/05/12 1700)  . DOPamine 3 mcg/kg/min (03/05/12 1900)  . lactated ringers    . nitroGLYCERIN  Stopped (03/05/12 1400)  . phenylephrine (NEO-SYNEPHRINE) Adult infusion 30 mcg/min (03/06/12 0500)   PRN Meds:.albumin human, albuterol, lactated ringers, metoprolol, midazolam, morphine injection, morphine injection, ondansetron (ZOFRAN) IV, oxyCODONE, sodium chloride, DISCONTD: 0.9 % irrigation (POUR BTL), DISCONTD: acetaminophen, DISCONTD: bisacodyl, DISCONTD: hemostatic agents, DISCONTD: ondansetron (ZOFRAN) IV, DISCONTD: Surgifoam 1 Gm with 0.9% sodium chloride (4 ml) topical solution  General appearance: alert, cooperative, no distress and remains on vent Neurologic: intact Heart: irregularly irregular rhythm Lungs: diminished breath sounds bibasilar Abdomen: soft, non-tender; bowel sounds normal; no masses,  no organomegaly and not tender no palpable mass, rt groin with bruising but not enlarging hematoma Extremities: extremities normal, atraumatic, no cyanosis or edema and Homans sign is negative, no sign of DVT Wound: sternum stable   Lab Results: CBC: Basename 03/06/12 0410 03/06/12 0026 03/05/12 2000  WBC 15.5* -- 17.5*  HGB 9.8* 9.9* --  HCT 28.2* 28.3* --  PLT 102* -- 116*   BMET:  Basename 03/06/12 0410 03/05/12 2000 03/05/12 1958 03/05/12 0947  NA 136 -- 140 --  K 4.7 -- 4.8 --  CL 105 -- 106 --  CO2 23 -- -- 19  GLUCOSE 146* -- 63* --  BUN 34* -- 31* --  CREATININE 2.29* 2.16* -- --  CALCIUM 7.4* -- -- 7.6*    PT/INR:  Basename 03/05/12 1355  LABPROT 18.1*  INR 1.47   Radiology: Dg Chest Portable 1 View In Am  03/06/2012  *RADIOLOGY REPORT*  Clinical Data: Bypass surgery.  PORTABLE CHEST - 1 VIEW  Comparison: 03/05/2012.  Findings: The endotracheal tube, NG tube and right IJ Swan-Ganz catheters are stable.  The left-sided chest tube is unchanged.  No pneumothorax.  Persistent low lung volumes with vascular crowding and bibasilar atelectasis.  A small left pleural effusion persists.  IMPRESSION:  1.  Stable support apparatus. 2.  Low lung volumes with vascular  crowding, basilar atelectasis and small left effusion.   Original Report Authenticated By: P. Loralie Champagne, M.D.    Dg Chest Portable 1 View  03/05/2012  *RADIOLOGY REPORT*  Clinical Data: Bypass surgery.  PORTABLE CHEST - 1 VIEW  Comparison: 03/08/2012.  Findings: The endotracheal tube is in good position, 5 cm above the carina.  The right IJ Swan-Ganz catheter tip was in the proximal right pulmonary artery.  The NG tube is in the stomach.  There is a left-sided chest tube in good position.  No pneumothorax.  Low lung volumes with vascular crowding and atelectasis.  No edema, effusions or  pneumothorax.  IMPRESSION:  1.  Support apparatus in good position without complicating features. 2.  Low lung volumes with vascular crowding and atelectasis.   Original Report Authenticated By: P. Loralie Champagne, M.D.      Assessment/Plan: S/P Procedure(s) (LRB): CORONARY ARTERY BYPASS GRAFTING (CABG) (N/A) Mobilize, extubate Diuresis, renal to see chronic renal failure as noted preop, clotted graft in rt arm Diabetes control- now on ss scale will resume lantus Continue foley due to diuresing patient, strict I&O, patient in ICU and urinary output monitoring See progression orders Will need extensive rehab to recovery, physically very limited preop Stable from preop retroperitoneal bleed, required 7 units of blood, but since surgery hct has been stable avoid heparin for now, use pas hose On pacerone for a fib last pm     [name] MD  Beeper [phone] Office [phone] 03/06/2012 7:30 AM
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
## 2012-03-06 NOTE — Procedures (Signed)
Extubation Procedure Note  Patient Details:   Name: [name] DOB: March 06, 1941 MRN: [id number]   Airway Documentation:     Evaluation  O2 sats: stable throughout Complications: No apparent complications Patient did tolerate procedure well. Bilateral Breath Sounds: Stridor;Diminished Suctioning: Airway Yes  Pt achieved around on VC, -20 NIF and was positive for cuff leak. Pt extubated to 5lpm Page and was oriented to name and place. Pt did develop some stridor and was given Racemic Epinephrine which decreased stridor sounds significantly. Pt resting at this time. RT will continue to monitor.   Parke Poisson 03/06/2012, 9:47 AM

## 2012-03-07 ENCOUNTER — Inpatient Hospital Stay (HOSPITAL_COMMUNITY): Payer: Medicare Other

## 2012-03-07 ENCOUNTER — Encounter (HOSPITAL_COMMUNITY): Payer: Self-pay | Admitting: Cardiothoracic Surgery

## 2012-03-07 LAB — GLUCOSE, CAPILLARY
Glucose-Capillary: 171 mg/dL — ABNORMAL HIGH (ref 70–99)
Glucose-Capillary: 204 mg/dL — ABNORMAL HIGH (ref 70–99)
Glucose-Capillary: 216 mg/dL — ABNORMAL HIGH (ref 70–99)

## 2012-03-07 LAB — CBC
HCT: 25.6 % — ABNORMAL LOW (ref 39.0–52.0)
Hemoglobin: 8.6 g/dL — ABNORMAL LOW (ref 13.0–17.0)
MCH: 29 pg (ref 26.0–34.0)
MCHC: 33.6 g/dL (ref 30.0–36.0)
MCV: 86.2 fL (ref 78.0–100.0)
Platelets: 99 10*3/uL — ABNORMAL LOW (ref 150–400)
RBC: 2.97 MIL/uL — ABNORMAL LOW (ref 4.22–5.81)
RDW: 18.2 % — ABNORMAL HIGH (ref 11.5–15.5)
WBC: 13.4 10*3/uL — ABNORMAL HIGH (ref 4.0–10.5)

## 2012-03-07 LAB — BASIC METABOLIC PANEL
BUN: 38 mg/dL — ABNORMAL HIGH (ref 6–23)
CO2: 22 mEq/L (ref 19–32)
Calcium: 7.4 mg/dL — ABNORMAL LOW (ref 8.4–10.5)
Chloride: 101 mEq/L (ref 96–112)
Creatinine, Ser: 2.6 mg/dL — ABNORMAL HIGH (ref 0.50–1.35)
GFR calc Af Amer: 26 mL/min — ABNORMAL LOW (ref >90)
GFR calc non Af Amer: 23 mL/min — ABNORMAL LOW (ref >90)
Glucose, Bld: 176 mg/dL — ABNORMAL HIGH (ref 70–99)
Potassium: 4.3 mEq/L (ref 3.5–5.1)
Sodium: 133 mEq/L — ABNORMAL LOW (ref 135–145)

## 2012-03-07 MED ORDER — FERROUS SULFATE 325 (65 FE) MG PO TABS
325.0000 mg | ORAL_TABLET | Freq: Every day | ORAL | Status: DC
  Administered 2012-03-08 – 2012-03-13 (×6): 325 mg via ORAL
  Filled 2012-03-07 (×8): qty 1

## 2012-03-07 MED ORDER — INSULIN ASPART 100 UNIT/ML ~~LOC~~ SOLN
0.0000 [IU] | SUBCUTANEOUS | Status: DC
  Administered 2012-03-07: 8 [IU] via SUBCUTANEOUS
  Administered 2012-03-08: 2 [IU] via SUBCUTANEOUS
  Administered 2012-03-08: 8 [IU] via SUBCUTANEOUS

## 2012-03-07 MED ORDER — INSULIN GLARGINE 100 UNIT/ML ~~LOC~~ SOLN
5.0000 [IU] | Freq: Once | SUBCUTANEOUS | Status: AC
  Administered 2012-03-07: 5 [IU] via SUBCUTANEOUS

## 2012-03-07 MED ORDER — INSULIN GLARGINE 100 UNIT/ML ~~LOC~~ SOLN
15.0000 [IU] | Freq: Two times a day (BID) | SUBCUTANEOUS | Status: DC
  Administered 2012-03-07 – 2012-03-08 (×3): 15 [IU] via SUBCUTANEOUS

## 2012-03-07 MED ORDER — INSULIN GLARGINE 100 UNIT/ML ~~LOC~~ SOLN: 18.0000 [IU] | Freq: Every day | SUBCUTANEOUS | Status: DC

## 2012-03-07 MED ORDER — FUROSEMIDE 10 MG/ML IJ SOLN
160.0000 mg | Freq: Three times a day (TID) | INTRAVENOUS | Status: DC
  Administered 2012-03-07 – 2012-03-09 (×6): 160 mg via INTRAVENOUS
  Filled 2012-03-07 (×8): qty 16

## 2012-03-07 MED ORDER — FUROSEMIDE 10 MG/ML IJ SOLN
80.0000 mg | Freq: Two times a day (BID) | INTRAMUSCULAR | Status: DC
  Administered 2012-03-07: 80 mg via INTRAVENOUS

## 2012-03-07 MED ORDER — INSULIN GLARGINE 100 UNIT/ML ~~LOC~~ SOLN: 20.0000 [IU] | Freq: Every day | SUBCUTANEOUS | Status: DC

## 2012-03-07 MED ORDER — FUROSEMIDE 10 MG/ML IJ SOLN
160.0000 mg | Freq: Three times a day (TID) | INTRAVENOUS | Status: DC
  Filled 2012-03-07 (×2): qty 16

## 2012-03-07 NOTE — Progress Notes (Signed)
[facility] Progress Note    Subjective:   NAEON, no complaints this AM.  Looks brighter today.  Some worsening of renal function in conjunction with lower BP   Objective:   BP 112/59  Pulse 87  Temp 98.7 F (37.1 C) (Oral)  Resp 20  Ht 5\' 10"  (1.778 m)  Wt 288 lb 2.3 oz (130.7 kg)  BMI 41.34 kg/m2  SpO2 96%  Intake/Output Summary (Last 24 hours) at 03/07/12 0824 Last data filed at 03/07/12 0800  Gross per 24 hour  Intake 2476.3 ml  Output   1660 ml  Net  816.3 ml   Weight change: -3.5 oz (-0.1 kg)  Physical Exam: General:  Vital signs reviewed and noted. Elderly WM, obese body habitus, fatigued but awakes, numerous acces points and bandages throughout  Head:  Normocephalic, atraumatic.  Eyes:  Right eye blindness with enucleation,  Nose:  Mucous membranes moist, not inflammed, nonerythematous.  Throat:  Oropharynx nonerythematous, no exudate appreciated.  Neck:  Right central cath in IJ; very short, thick neck  Lungs:  Patient on 4L n/c, Poor respiratory effort, decreased breath sounds in lower lobes bilaterally with expiratory wheezes in upper lobes bilaterally, no rales or rhonchi  Chest  Central midline incision with bandage covering, left-sided chest tube  Heart:  RRR. Distant heart sounds  Abdomen:  Protuberant, distended, non-tender, hypoactive bowel sounds.  Extremities:  2+ pitting edema of hands, 1+ pitting edema of feet, no pretibial edema.RUE with AVF without palpable thrill; lower extremities with medial thigh incisions bilaterally  GU:  Extensive ecchymosis and hematoma of right inguinal area with scrotal edema of ecchymosis  Neurologic:  Decreased hearing so could not assess orientation; strength testing limited by numerous lines and bandages  Skin:  No visible rashes, scars.    Imaging: Dg Chest Port 1 View  03/07/2012  *RADIOLOGY REPORT*  Clinical Data: Postop CABG 03/05/2012.  PORTABLE CHEST - 1 VIEW  Comparison:  03/06/2012.  Findings: 0550 hours.  Interval extubation with removal of the nasogastric tube and right IJ Swan-Ganz catheter.  A right IJ sheath and left chest tube remain in place.  There are persistent low lung volumes with bibasilar atelectasis and a possible small left pleural effusion.  No pneumothorax is evident.  Heart size and mediastinal contours are stable allowing for rotation and the low lung volumes.  Glenohumeral degenerative changes are noted.  IMPRESSION: No significant change in bibasilar atelectasis following extubation.  No pneumothorax.   Original Report Authenticated By: Gerrianne Scale, M.D.    Dg Chest Portable 1 View In Am  03/06/2012  *RADIOLOGY REPORT*  Clinical Data: Bypass surgery.  PORTABLE CHEST - 1 VIEW  Comparison: 03/05/2012.  Findings: The endotracheal tube, NG tube and right IJ Swan-Ganz catheters are stable.  The left-sided chest tube is unchanged.  No pneumothorax.  Persistent low lung volumes with vascular crowding and bibasilar atelectasis.  A small left pleural effusion persists.  IMPRESSION:  1.  Stable support apparatus. 2.  Low lung volumes with vascular crowding, basilar atelectasis and small left effusion.   Original Report Authenticated By: P. Loralie Champagne, M.D.    Dg Chest Portable 1 View  03/05/2012  *RADIOLOGY REPORT*  Clinical Data: Bypass surgery.  PORTABLE CHEST - 1 VIEW  Comparison: 03/08/2012.  Findings: The endotracheal tube is in good position, 5 cm above the carina.  The right IJ Swan-Ganz catheter tip was in the proximal right pulmonary artery.  The NG tube is in the stomach.  There is a left-sided chest tube in good position.  No pneumothorax.  Low lung volumes with vascular crowding and atelectasis.  No edema, effusions or  pneumothorax.  IMPRESSION:  1.  Support apparatus in good position without complicating features. 2.  Low lung volumes with vascular crowding and atelectasis.   Original Report Authenticated By: P. Loralie Champagne, M.D.      Labs: BMET  Lab 03/07/12 0400 03/06/12 1656 03/06/12 1651 03/06/12 0410 03/05/12 2000 03/05/12 1958 03/05/12 1405 03/05/12 1241 03/05/12 0947 03/04/12 0534 03/03/12 0513 03/02/12 0525  NA 133* 131* 137 136 -- 140 136 134* -- -- -- --  K 4.3 4.3 4.6 4.7 -- 4.8 4.8 5.3* -- -- -- --  CL 101 99 102 105 -- 106 -- -- 103 100 -- --  CO2 22 22 -- 23 -- -- -- -- 19 23 23 24   GLUCOSE 176* 200* 192* 146* -- 63* 251* 302* -- -- -- --  BUN 38* 36* 34* 34* -- 31* -- -- 32* 20 -- --  CREATININE 2.60* 2.51* 2.70* 2.29* 2.16* 2.20* -- -- 2.14* -- -- --  ALB -- -- -- -- -- -- -- -- -- -- -- --  CALCIUM 7.4* 7.4* -- 7.4* -- -- -- -- 7.6* 9.2 9.0 9.1  PHOS -- -- -- -- -- -- -- -- -- -- -- --   CBC  Lab 03/07/12 0400 03/06/12 2125 03/06/12 1656 03/06/12 1651 03/06/12 1213 03/06/12 0410 03/05/12 2000  WBC 13.4* -- -- -- 13.8* 15.5* 17.5*  NEUTROABS -- -- -- -- -- -- --  HGB 8.6* 9.0* 8.8* 8.8* -- -- --  HCT 25.6* 26.3* 26.1* 26.0* -- -- --  MCV 86.2 -- -- -- 85.2 83.7 83.4  PLT 99* -- -- -- 86* 102* 116*    Medications:      . acetaminophen  1,000 mg Oral Q6H   Or  . acetaminophen (TYLENOL) oral liquid 160 mg/5 mL  975 mg Per Tube Q6H  . aspirin EC  325 mg Oral Daily   Or  . aspirin  324 mg Per Tube Daily  . atorvastatin  80 mg Oral q1800  . bisacodyl  10 mg Oral Daily   Or  . bisacodyl  10 mg Rectal Daily  . brimonidine  1 drop Left Eye BID  . cefUROXime (ZINACEF)  IV  1.5 g Intravenous Q12H  . docusate sodium  200 mg Oral Daily  . dorzolamide-timolol  1 drop Left Eye BID  . erythromycin  1 application Right Eye BID  . ezetimibe  10 mg Oral Daily  . furosemide  80 mg Intravenous Once  . insulin aspart  0-24 Units Subcutaneous Q4H  . insulin glargine  15 Units Subcutaneous [id number]  . insulin regular  0-10 Units Intravenous TID WC  . ipratropium  1 spray Nasal BID  . levalbuterol  0.63 mg Nebulization TID  . loteprednol  1 drop Right Eye BID  . magnesium sulfate  4 g Intravenous  Once  . metoprolol tartrate  12.5 mg Oral BID   Or  . metoprolol tartrate  12.5 mg Per Tube BID  . pantoprazole  40 mg Oral Q1200  . polyvinyl alcohol  1 drop Both Eyes BID  . sodium chloride  3 mL Intravenous Q12H  . DISCONTD: insulin aspart  0-24 Units Subcutaneous Q4H     Assessment/ Plan:   Pt is a 71 y.o. yo male with baseline CKD stage III, COPD, DM2, and CAD s/p CABG  on 03/05/12, who is experiencing post-operative acute kidney injury.   Acute Kidney Injury - The patient has well documented CKD - diabetic nephropathy with a baseline creatinine of 1.7 prior to admission. He is a patient of Dr. Eliott Nine at [facility], who prescribed the patient ramipril for treatment of his proteinuria. It appears that this medication was stopped prior to his cardiac catheterization, and his acute kidney injury did not occur until after a week after the catheterization, making contrast nephropathy very unlikely. The patient had a significant retroperitoneal bleed overnight prior to surgery, which seemed to set off the bump in creatinine. This indicates that the blood loss likely caused acute tubular necrosis, evidenced by a BUN/Cr < 15. At this point, the patient creatinine is elevated, but stable, and he is making urine. There no offending agents on his medication list at this point. Therefore, there is no rush to intervene as the kidney function will hopefully improve over the coming days and excess fluid will be removed (+9 L since admission).  - Follow renal panel each AM  - Consider diuresis as patient if patient remains fluid overloaded - will start him on scheduled lasix 80 IV BID - strict I/O's  - worsening Cr with sub-obptimal UOP yesterday, consider furosemide Also notice that his BP at time is dropping maybe too low to give him adequate kidney perfusion.  Will write new parameters to try and keep BP up.    Normocytic Anemia - Likely secondary to acute blood loss from retroperitoneal  bleed and blood loss from surgery. It is unlikely that this is related to kidney function, as his hgb was approximately 12 on the day of admission.  - Continue to trend.   Hypoxemia - Patient currently on 4L n/c, but this is more likely related to atelectasis and COPD than anemia.  - management per primary team.   DVT PPX - SCD's in place. Management for primary team.      Si Raider. Clinton Sawyer, MD   03/07/2012, 8:24 AM   Patient seen and examined, agree with above note with above modifications.  71 year old WM with multiple medical issues including stage 3 CKD at baseline.  Now with A on CRF in the setting of cardiac cath and CABG.  Is overloaded, will challenge with lasix and also give new parameters for lopressor so that BP and kidney perfusion can remain intact.  Annie Sable, MD 03/07/2012

## 2012-03-07 NOTE — Progress Notes (Signed)
Occupational Therapy Evaluation Patient Details Name: [name] MRN: [id number] DOB: 11-25-1940 Today's Date: 03/07/2012 Time: 4034-7425 OT Time Calculation (min): 28 min  OT Assessment / Plan / Recommendation Clinical Impression  71 yo s/p CABG. Pt will benefit from skilled OT services to max independence with ADL and mobility for ADL to facilitate D/C to next venue of care and decrese burden of care. PLOF is mod I to independent. Pt may need rehab prior to D/C home pending progress. Will follow acutely. Vitals stable during assessment.    OT Assessment  Patient needs continued OT Services    Follow Up Recommendations  Other (comment) (rehab - will further assess)    Barriers to Discharge None    Equipment Recommendations  Defer to next venue    Recommendations for Other Services    Frequency  Min 2X/week    Precautions / Restrictions Precautions Precautions: Sternal   Pertinent Vitals/Pain Did not rate. Tolerated well. MAP @ 67. See flowsheet for BP    ADL  Eating/Feeding: NPO Grooming: Simulated;Maximal assistance Where Assessed - Grooming: Unsupported sitting Upper Body Bathing: Simulated;Maximal assistance Where Assessed - Upper Body Bathing: Unsupported sitting Lower Body Bathing: Simulated;+1 Total assistance Where Assessed - Lower Body Bathing: Supported sit to stand Upper Body Dressing: Simulated;Maximal assistance Where Assessed - Upper Body Dressing: Unsupported sitting Lower Body Dressing: Simulated;+1 Total assistance Where Assessed - Lower Body Dressing: Supported sit to stand Toilet Transfer: Simulated;+2 Total assistance Toilet Transfer: Patient Percentage: 70% Toilet Transfer Method: Sit to stand Transfers/Ambulation Related to ADLs: +2 total A ADL Comments: a for all ADL at this time    OT Diagnosis: Generalized weakness;Acute pain;Altered mental status  OT Problem List: Decreased strength;Decreased range of motion;Decreased activity  tolerance;Impaired balance (sitting and/or standing);Decreased coordination;Decreased cognition;Decreased safety awareness;Decreased knowledge of use of DME or AE;Decreased knowledge of precautions;Cardiopulmonary status limiting activity;Impaired sensation;Obesity;Pain OT Treatment Interventions: Self-care/ADL training;Therapeutic exercise;Energy conservation;DME and/or AE instruction;Therapeutic activities;Cognitive remediation/compensation;Patient/family education;Balance training   OT Goals Acute Rehab OT Goals OT Goal Formulation: With patient/family Time For Goal Achievement: 03/21/12 Potential to Achieve Goals: Good  Visit Information  Last OT Received On: 03/07/12 Assistance Needed: +3 or more PT/OT Co-Evaluation/Treatment: Yes    Subjective Data      Prior Functioning  Vision/Perception  Home Living Lives With: Spouse Available Help at Discharge: Family;Available 24 hours/day (wife has hx of back surgery, can't give physical assist) Type of Home: House Home Access: Ramped entrance Home Layout: One level Bathroom Shower/Tub: Engineer, manufacturing systems: Handicapped height Bathroom Accessibility: Yes How Accessible: Accessible via walker Home Adaptive Equipment: Walker - four wheeled;Straight cane;Tub transfer bench Prior Function Level of Independence: Independent with assistive device(s) (amb with straight cane) Able to Take Stairs?: Yes Vocation: Retired Musician: [employer] Dominant Hand: Right      Cognition  Overall Cognitive Status: Impaired Arousal/Alertness: Awake/alert Orientation Level: Disoriented to;Time Behavior During Session: Anxious Cognition - Other Comments: Pt's wife reports that pt appears confused at times    Extremity/Trunk Assessment Right Upper Extremity Assessment RUE ROM/Strength/Tone: Unable to fully assess (@ 3+/5 throughout) Left Upper Extremity Assessment LUE ROM/Strength/Tone: Unable to fully assess (2 3+/5  throughout) Trunk Assessment Trunk Assessment: Kyphotic   Mobility  Shoulder Instructions  Bed Mobility Bed Mobility: Sit to Supine;Supine to Sit Supine to Sit: 1: +2 Total assist;HOB elevated Supine to Sit: Patient Percentage: 30% Sit to Supine: 1: +2 Total assist;HOB flat Sit to Supine: Patient Percentage: 10% Details for Bed Mobility Assistance: assist to bring feet  off and trunk up Transfers Transfers: Sit to Stand;Stand to Sit Sit to Stand: 1: +2 Total assist;From bed Sit to Stand: Patient Percentage: 70% Stand to Sit: 1: +2 Total assist;To bed Stand to Sit: Patient Percentage: 80% Details for Transfer Assistance: Pt sttod x 3. Pt pulling LLE under bed demonstrating deficits with proprioception. Unable to maintain WB via LLE with Max vc. MD notified but stated it was due to PN       Exercise     Balance  +2 total A for safety. Difficulty maintaining wt via LLE.   End of Session OT - End of Session Activity Tolerance: Patient limited by fatigue;Treatment limited secondary to medical complications (Comment) Patient left: in bed;with call bell/phone within reach;with family/visitor present Nurse Communication: Mobility status;Precautions  GO     [name],HILLARY 03/07/2012, 2:36 PM [facility], OTR/L  [phone] 03/07/2012

## 2012-03-07 NOTE — Progress Notes (Signed)
Clinical Social Work Department BRIEF PSYCHOSOCIAL ASSESSMENT 03/07/2012  Patient:  [name],[name]     Account Number:  [id number]     Admit date:  02/27/2012  Clinical Social Worker:  Conley Simmonds  Date/Time:  03/07/2012 03:46 PM  Referred by:  Physician  Date Referred:  03/07/2012 Referred for  SNF Placement   Other Referral:   Interview type:  Family Other interview type:    PSYCHOSOCIAL DATA Living Status:  WIFE Admitted from facility:   Level of care:   Primary support name:   Primary support relationship to patient:  SPOUSE Degree of support available:    CURRENT CONCERNS Current Concerns  Post-Acute Placement   Other Concerns:    SOCIAL WORK ASSESSMENT / PLAN CSW spoke with pt wife regarding pt/ot recommendations and d/c planning-CSW explained the purpose and process of SNF placement- Pt wife has reviewed pts insurance policy including benefits and is in agreement with SNF and will be initiating bed search and provide pt and wife with bed offers-   Assessment/plan status:  Psychosocial Support/Ongoing Assessment of Needs Other assessment/ plan:   Information/referral to community resources:   [facility] Rehab    PATIENT'S/FAMILY'S RESPONSE TO PLAN OF CARE: Pt wife in agreement with SNF-pt wife is very proactive and well educated with regards to pt benefits/needs/copays and is seeking placement in [facility]-  CSW will follow and provide bed offers/facilitate d/c--  Jodean Lima, [phone]

## 2012-03-07 NOTE — Progress Notes (Signed)
Dr. Tyrone Sage in and aware of CBG 201, viewed CT results from earlier today

## 2012-03-07 NOTE — Progress Notes (Signed)
SUBJECTIVE:  Extubated today.  Had spontaneous left sided RP bleed.  Cath was done from right leg.  AFib, now in NSR on Amiodarone.  OBJECTIVE:   Vitals:   Filed Vitals:   03/07/12 1500 03/07/12 1535 03/07/12 1551 03/07/12 1600  BP: 120/54   121/55  Pulse: 86   82  Temp:  97.5 F (36.4 C)    TempSrc:  Oral    Resp: 22   24  Height:      Weight:      SpO2: 98%  98% 99%   I&O's:   Intake/Output Summary (Last 24 hours) at 03/07/12 1617 Last data filed at 03/07/12 1600  Gross per 24 hour  Intake 2314.1 ml  Output   1846 ml  Net  468.1 ml   TELEMETRY: Reviewed telemetry pt in NSR:     PHYSICAL EXAM General: Well developed, well nourished, in no acute distress Head:   Normal cephalic and atramatic  Lungs:  No wheezing Heart:   HRRR  Abdomen: obese    LABS: Basic Metabolic Panel:  Basename 03/07/12 0400 03/06/12 1656 03/06/12 0410  NA 133* 131* --  K 4.3 4.3 --  CL 101 99 --  CO2 22 22 --  GLUCOSE 176* 200* --  BUN 38* 36* --  CREATININE 2.60* 2.51* --  CALCIUM 7.4* 7.4* --  MG -- 1.7 1.8  PHOS -- -- --   Liver Function Tests:  [facility] 03/05/12 2000  AST 38*  ALT 24  ALKPHOS 29*  BILITOT 0.5  PROT 4.3*  ALBUMIN 2.5*   No results found for this basename: LIPASE:2,AMYLASE:2 in the last 72 hours CBC:  Basename 03/07/12 0400 03/06/12 2125 03/06/12 1213  WBC 13.4* -- 13.8*  NEUTROABS -- -- --  HGB 8.6* 9.0* --  HCT 25.6* 26.3* --  MCV 86.2 -- 85.2  PLT 99* -- 86*   Cardiac Enzymes: No results found for this basename: CKTOTAL:3,CKMB:3,CKMBINDEX:3,TROPONINI:3 in the last 72 hours BNP: No components found with this basename: POCBNP:3 D-Dimer: No results found for this basename: DDIMER:2 in the last 72 hours Hemoglobin A1C: No results found for this basename: HGBA1C in the last 72 hours Fasting Lipid Panel: No results found for this basename: CHOL,HDL,LDLCALC,TRIG,CHOLHDL,LDLDIRECT in the last 72 hours Thyroid Function Tests:  Basename 03/05/12  2000  TSH 2.856  T4TOTAL --  T3FREE --  THYROIDAB --   Anemia Panel: No results found for this basename: VITAMINB12,FOLATE,FERRITIN,TIBC,IRON,RETICCTPCT in the last 72 hours Coag Panel:   Lab Results  Component Value Date   INR 1.47 03/05/2012   INR 1.01 02/27/2012    RADIOLOGY: Ct Abdomen Pelvis Wo Contrast  03/07/2012  *RADIOLOGY REPORT*  Clinical Data: Left-sided abdominal/flank pain.  Recent cardiac catheterization.  Dropping hemoglobin.  CT ABDOMEN AND PELVIS WITHOUT CONTRAST  Technique:  Multidetector CT imaging of the abdomen and pelvis was performed following the standard protocol without intravenous contrast.  Comparison: None.  Findings: The lung bases demonstrate small effusions and bibasilar atelectasis.  A left-sided chest tube is in place.  There is a large retroperitoneal hematoma on the left this extends from just below the spleen all way down into the extraperitoneal pelvis.  Moderate-sized hematoma also involves the left psoas muscle.  The solid abdominal organs are intact. Bilateral adrenal gland adenomas are noted.  No mesenteric or retroperitoneal mass or adenopathy.  The aorta demonstrates moderate atherosclerotic calcifications.  No focal aneurysm.  There is a Foley catheter in the bladder.  Prostate gland and seminal vesicles are  unremarkable.  No inguinal mass or hernia. There is a small right groin hematoma likely from a recent cardiac catheterization.  The bony structures are intact.  Remote healed pubic rami fractures are noted on the right side.  IMPRESSION: Large retroperitoneal abdominal and pelvic hematoma and left psoas hematoma.   Original Report Authenticated By: P. Loralie Champagne, M.D.    Dg Chest 2 View  02/27/2012  *RADIOLOGY REPORT*  Clinical Data: Preop  CHEST - 2 VIEW  Comparison: 08/21/2007  Findings: Normal heart size.  Lungs are under aerated with bibasilar atelectasis.  No pneumothorax and no pleural effusion.  IMPRESSION: Bibasilar atelectasis.   Original  Report Authenticated By: Donavan Burnet, M.D.    Dg Chest Port 1 View  03/07/2012  *RADIOLOGY REPORT*  Clinical Data: Postop CABG 03/05/2012.  PORTABLE CHEST - 1 VIEW  Comparison: 03/06/2012.  Findings: 0550 hours.  Interval extubation with removal of the nasogastric tube and right IJ Swan-Ganz catheter.  A right IJ sheath and left chest tube remain in place.  There are persistent low lung volumes with bibasilar atelectasis and a possible small left pleural effusion.  No pneumothorax is evident.  Heart size and mediastinal contours are stable allowing for rotation and the low lung volumes.  Glenohumeral degenerative changes are noted.  IMPRESSION: No significant change in bibasilar atelectasis following extubation.  No pneumothorax.   Original Report Authenticated By: Gerrianne Scale, M.D.    Dg Chest Portable 1 View In Am  03/06/2012  *RADIOLOGY REPORT*  Clinical Data: Bypass surgery.  PORTABLE CHEST - 1 VIEW  Comparison: 03/05/2012.  Findings: The endotracheal tube, NG tube and right IJ Swan-Ganz catheters are stable.  The left-sided chest tube is unchanged.  No pneumothorax.  Persistent low lung volumes with vascular crowding and bibasilar atelectasis.  A small left pleural effusion persists.  IMPRESSION:  1.  Stable support apparatus. 2.  Low lung volumes with vascular crowding, basilar atelectasis and small left effusion.   Original Report Authenticated By: P. Loralie Champagne, M.D.    Dg Chest Portable 1 View  03/05/2012  *RADIOLOGY REPORT*  Clinical Data: Bypass surgery.  PORTABLE CHEST - 1 VIEW  Comparison: 03/08/2012.  Findings: The endotracheal tube is in good position, 5 cm above the carina.  The right IJ Swan-Ganz catheter tip was in the proximal right pulmonary artery.  The NG tube is in the stomach.  There is a left-sided chest tube in good position.  No pneumothorax.  Low lung volumes with vascular crowding and atelectasis.  No edema, effusions or  pneumothorax.  IMPRESSION:  1.  Support  apparatus in good position without complicating features. 2.  Low lung volumes with vascular crowding and atelectasis.   Original Report Authenticated By: P. Loralie Champagne, M.D.       ASSESSMENT: AFib, CAD, RP bleed  PLAN:  In NSR on Amiodarone.  On low dose dopamine at this time. Renal insufficiency. Mild anemia, buut stable. Will follow along.  Corky Crafts., MD  03/07/2012  4:17 PM

## 2012-03-07 NOTE — Progress Notes (Signed)
1145 to CT via bed, O2 and monitor, 1215 from CT same fashion

## 2012-03-07 NOTE — Progress Notes (Signed)
SICU p.m. Rounds  Patient resting comfortably Normal sinus rhythm CABG is elevated this evening will order Lantus 15 units twice a day Lab Results  Component Value Date   WBC 13.4* 03/07/2012   HGB 8.6* 03/07/2012   HCT 25.6* 03/07/2012   MCV 86.2 03/07/2012   PLT 99* 03/07/2012    Lab Results  Component Value Date   CREATININE 2.60* 03/07/2012   BUN 38* 03/07/2012   NA 133* 03/07/2012   K 4.3 03/07/2012   CL 101 03/07/2012   CO2 22 03/07/2012   Urine output adequate with IV Lasix but creatinine now 2.6 Continue renal dopamine continue IV amiodarone

## 2012-03-07 NOTE — Progress Notes (Addendum)
TCTS DAILY PROGRESS NOTE                   [address]            [place] [address]          [phone]      2 Days Post-Op Procedure(s) (LRB): CORONARY ARTERY BYPASS GRAFTING (CABG) (N/A)  Total Length of Stay:  LOS: 9 days   Subjective: Patient is hard of hearing  Objective: Vital signs in last 24 hours: Temp:  [98.5 F (36.9 C)-98.7 F (37.1 C)] 98.7 F (37.1 C) (09/18 0723) Pulse Rate:  [80-124] 87  (09/18 0800) Cardiac Rhythm:  [-] Normal sinus rhythm (09/18 0748) Resp:  [14-26] 20  (09/18 0800) BP: (85-127)/(50-72) 112/59 mmHg (09/18 0800) SpO2:  [94 %-99 %] 96 % (09/18 0800) Weight:  [288 lb 2.3 oz (130.7 kg)] 288 lb 2.3 oz (130.7 kg) (09/18 0500)  Filed Weights   02/28/12 0916 03/06/12 0500 03/07/12 0500  Weight: 255 lb 8.2 oz (115.9 kg) 288 lb 5.8 oz (130.8 kg) 288 lb 2.3 oz (130.7 kg)   Weight change: -3.5 oz (-0.1 kg)      Intake/Output from previous day: 09/17 0701 - 09/18 0700 In: 2574.7 [P.O.:1020; I.V.:1546.7; IV Piggyback:8] Out: 1775 [Urine:1480; Chest Tube:295]  Intake/Output this shift: Total I/O In: 60.8 [I.V.:60.8] Out: 60 [Urine:60]  Current Meds: Scheduled Meds:   . acetaminophen  1,000 mg Oral Q6H   Or  . acetaminophen (TYLENOL) oral liquid 160 mg/5 mL  975 mg Per Tube Q6H  . aspirin EC  325 mg Oral Daily   Or  . aspirin  324 mg Per Tube Daily  . atorvastatin  80 mg Oral q1800  . bisacodyl  10 mg Oral Daily   Or  . bisacodyl  10 mg Rectal Daily  . brimonidine  1 drop Left Eye BID  . cefUROXime (ZINACEF)  IV  1.5 g Intravenous Q12H  . docusate sodium  200 mg Oral Daily  . dorzolamide-timolol  1 drop Left Eye BID  . erythromycin  1 application Right Eye BID  . ezetimibe  10 mg Oral Daily  . furosemide  80 mg Intravenous Once  . insulin aspart  0-24 Units Subcutaneous Q4H  . insulin glargine  15 Units Subcutaneous BH-q7a  . insulin regular  0-10 Units Intravenous TID WC  . ipratropium  1 spray Nasal BID    . levalbuterol  0.63 mg Nebulization TID  . loteprednol  1 drop Right Eye BID  . magnesium sulfate  4 g Intravenous Once  . metoprolol tartrate  12.5 mg Oral BID   Or  . metoprolol tartrate  12.5 mg Per Tube BID  . pantoprazole  40 mg Oral Q1200  . polyvinyl alcohol  1 drop Both Eyes BID  . sodium chloride  3 mL Intravenous Q12H   Continuous Infusions:   . sodium chloride 20 mL/hr at 03/05/12 1430  . sodium chloride 50 mL/hr at 03/05/12 1400  . sodium chloride    . amiodarone (NEXTERONE PREMIX) 360 mg/200 mL dextrose 0.5 mg/min (03/06/12 0408)  . dexmedetomidine Stopped (03/05/12 1700)  . DOPamine 3 mcg/kg/min (03/07/12 0106)  . lactated ringers    . nitroGLYCERIN Stopped (03/05/12 1400)  . phenylephrine (NEO-SYNEPHRINE) Adult infusion Stopped (03/06/12 1100)  . DISCONTD: insulin (NOVOLIN-R) infusion     PRN Meds:.albumin human, albuterol, metoprolol, midazolam, morphine injection, ondansetron (ZOFRAN) IV, oxyCODONE, sodium chloride  General appearance: alert and cooperative Neurologic:  intact Heart: regular rate and rhythm Lungs: diminished breath sounds bilateral bases and wheezes bilaterally Abdomen: Soft, obese, non tender, occasional bowel sounds Extremities: edema bilaterally Wound: Dressings are clean and dry  Lab Results: CBC: Basename 03/07/12 0400 03/06/12 2125 03/06/12 1213  WBC 13.4* -- 13.8*  HGB 8.6* 9.0* --  HCT 25.6* 26.3* --  PLT 99* -- 86*   BMET:  Basename 03/07/12 0400 03/06/12 1656  NA 133* 131*  K 4.3 4.3  CL 101 99  CO2 22 22  GLUCOSE 176* 200*  BUN 38* 36*  CREATININE 2.60* 2.51*  CALCIUM 7.4* 7.4*    PT/INR:  Basename 03/05/12 1355  LABPROT 18.1*  INR 1.47   Radiology: Dg Chest Port 1 View  03/07/2012  *RADIOLOGY REPORT*  Clinical Data: Postop CABG 03/05/2012.  PORTABLE CHEST - 1 VIEW  Comparison: 03/06/2012.  Findings: 0550 hours.  Interval extubation with removal of the nasogastric tube and right IJ Swan-Ganz catheter.  A right IJ  sheath and left chest tube remain in place.  There are persistent low lung volumes with bibasilar atelectasis and a possible small left pleural effusion.  No pneumothorax is evident.  Heart size and mediastinal contours are stable allowing for rotation and the low lung volumes.  Glenohumeral degenerative changes are noted.  IMPRESSION: No significant change in bibasilar atelectasis following extubation.  No pneumothorax.   Original Report Authenticated By: Gerrianne Scale, M.D.    Dg Chest Portable 1 View In Am  03/06/2012  *RADIOLOGY REPORT*  Clinical Data: Bypass surgery.  PORTABLE CHEST - 1 VIEW  Comparison: 03/05/2012.  Findings: The endotracheal tube, NG tube and right IJ Swan-Ganz catheters are stable.  The left-sided chest tube is unchanged.  No pneumothorax.  Persistent low lung volumes with vascular crowding and bibasilar atelectasis.  A small left pleural effusion persists.  IMPRESSION:  1.  Stable support apparatus. 2.  Low lung volumes with vascular crowding, basilar atelectasis and small left effusion.   Original Report Authenticated By: P. Loralie Champagne, M.D.    Dg Chest Portable 1 View  03/05/2012  *RADIOLOGY REPORT*  Clinical Data: Bypass surgery.  PORTABLE CHEST - 1 VIEW  Comparison: 03/08/2012.  Findings: The endotracheal tube is in good position, 5 cm above the carina.  The right IJ Swan-Ganz catheter tip was in the proximal right pulmonary artery.  The NG tube is in the stomach.  There is a left-sided chest tube in good position.  No pneumothorax.  Low lung volumes with vascular crowding and atelectasis.  No edema, effusions or  pneumothorax.  IMPRESSION:  1.  Support apparatus in good position without complicating features. 2.  Low lung volumes with vascular crowding and atelectasis.   Original Report Authenticated By: P. Loralie Champagne, M.D.      Assessment/Plan: S/P Procedure(s) (LRB): CORONARY ARTERY BYPASS GRAFTING (CABG) (N/A) 1.CV-PAF. SR this am.On Amiodarone gttp and  Dopamine. Wean Dopamine. Continue  Lopressor 12.5 bid.  2. Pulmonary-Chest tube with 295 cc of output. Continue to suction for now.CXR this am shows low lung volumes, bibasilar atelectasis, no ptx, and probable small left pleural effusion.  Encourage incentive spirometer and flutter valve. Xopenex TID. 3.DM-CBGs 180/171/155.Will increase Insulin for better glucose control.  4.ABL anemia- H and H decreased to 8.6 and 25.6 this am.Resume Ferrous sulfate  5.Thrombocytopenia-Platelets 99,000  6.CKD-Creatinine 2.6 this am (baseline around 1.7).Per nephrology.Foley to remain.   Doree Fudge PA-C 03/07/2012 8:49 AM     Will get non contrast ct of abdomen I have  seen and examined [name] and agree with the above assessment  and plan.  [name] MD Beeper [phone] Office [phone] 03/07/2012 11:22 AM

## 2012-03-07 NOTE — Progress Notes (Signed)
Physical Therapy Treatment Patient Details Name: [name] MRN: [id number] DOB: 27-Jan-1941 Today's Date: 03/07/2012 Time: 4098-1191 PT Time Calculation (min): 28 min  PT Assessment / Plan / Recommendation Comments on Treatment Session  pt rpesents with CABG.  pt generally weak and debilitated.  pt indicating that L LE feels funny and doesn't feel the same as R LE.  pt with proprioceptive difficulties in L LE with MD made aware and states this is due to peripheral neuropathy.      Follow Up Recommendations  Skilled nursing facility    Barriers to Discharge        Equipment Recommendations  Defer to next venue    Recommendations for Other Services    Frequency Min 3X/week   Plan Discharge plan remains appropriate;Frequency remains appropriate    Precautions / Restrictions Precautions Precautions: Sternal;Fall Restrictions Weight Bearing Restrictions: No   Pertinent Vitals/Pain Indicates pain around incision and groin area from hematoma.      Mobility  Bed Mobility Bed Mobility: Sit to Supine;Supine to Sit Supine to Sit: 1: +2 Total assist;HOB elevated Supine to Sit: Patient Percentage: 30% Sit to Supine: 1: +2 Total assist;HOB flat Sit to Supine: Patient Percentage: 10% Details for Bed Mobility Assistance: assist to bring feet off and trunk up Transfers Transfers: Sit to Stand;Stand to Sit Sit to Stand: 1: +2 Total assist;From bed Sit to Stand: Patient Percentage: 70% Stand to Sit: 1: +2 Total assist;To bed Stand to Sit: Patient Percentage: 80% Details for Transfer Assistance: Pt sttod x 3. Pt pulling LLE under bed demonstrating deficits with proprioception. Unable to maintain WB via LLE with Max vc. MD notified but stated it was due to PN Ambulation/Gait Ambulation/Gait Assistance: Not tested (comment) Stairs: No Wheelchair Mobility Wheelchair Mobility: No    Exercises     PT Diagnosis:    PT Problem List:   PT Treatment Interventions:     PT Goals Acute  Rehab PT Goals Time For Goal Achievement: 03/20/12 PT Goal: Supine/Side to Sit - Progress: Progressing toward goal PT Goal: Sit to Supine/Side - Progress: Progressing toward goal PT Goal: Sit to Stand - Progress: Progressing toward goal PT Goal: Stand to Sit - Progress: Progressing toward goal  Visit Information  Last PT Received On: 03/07/12 Assistance Needed: +3 or more PT/OT Co-Evaluation/Treatment: Yes    Subjective Data  Subjective: That leg isn't working right.  -pt indicating L LE   Cognition  Overall Cognitive Status: Impaired Arousal/Alertness: Awake/alert Orientation Level: Disoriented to;Time Behavior During Session: Anxious Cognition - Other Comments: Pt's wife reports that pt appears confused at times    Balance  Balance Balance Assessed: Yes Static Sitting Balance Static Sitting - Balance Support: Bilateral upper extremity supported Static Sitting - Level of Assistance: 5: Stand by assistance  End of Session PT - End of Session Equipment Utilized During Treatment: Oxygen Activity Tolerance: Patient limited by fatigue Patient left: in bed;with call bell/phone within reach Nurse Communication: Mobility status;Need for lift equipment   GP     Sunny Schlein, [place] [phone] 03/07/2012, 3:59 PM

## 2012-03-07 NOTE — Op Note (Signed)
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  PROCEDURE PERFORMED:  Coronary artery bypass grafting x4 with left internal mammary to the left anterior descending coronary artery, reverse saphenous vein graft to the diagonal coronary artery, sequential reverse saphenous vein graft to the first obtuse marginal and distal circumflex with bilateral thigh endovein harvesting.  SURGEON:  Sheliah Plane, MD  FIRST ASSISTANT:  Lowella Dandy, PA  BRIEF HISTORY:  The patient is a 71 year old male with history of blindness, deafness, renal insufficiency, but not currently on dialysis, who presents with new onset of rest angina.  He had been loaded with Plavix, underwent cardiac catheterization by Dr. Mayford Knife and found to have 80% left main, 80%, proximal circumflex, luminal irregularities throughout the right coronary artery, but with no high-grade stenosis. Because of his Plavix administration and known renal insufficiency, we did not proceed with surgery immediately, holding Plavix and monitoring renal function.  The day prior to surgery, the patient's hematocrit was 30 and creatinine had remained stable.  He was brought to the operating room and the first hematocrit in the OR was 15.  There was no obvious source of blood loss, it was presumed that he had probably had an undiagnosed retroperitoneal bleed while on heparin during the  night prior to surgery.  At this point, we were confronted with either waking him up and delaying surgery and obtain a CT scan or proceeding with surgery in the patient with left main and circumflex disease, and then being unable to anticoagulate him.  We decided to transfuse as appropriately, proceed with the surgery to stabilize his cardiac status. During the induction of anesthesia and through the procedure, his blood pressure remained reasonably stable, a total of 7 units of packed red blood cells were given both prior to going on pump and while on pump.  DESCRIPTION OF PROCEDURE:  The patient underwent general endotracheal anesthesia and remained hemodynamically stable.  A TEE probe was placed. It was at this point that hematocrit returned dramatically lower than the 1 less than 24 hours before.  A TEE probe was placed.  The skin of chest and legs was prepped with Betadine and draped in usual sterile manner.  Using the [employer] endovein harvesting system, segment of vein was harvested from the left thigh at the knee, it became small and unusable.  Additional segment of vein was harvested from the right thigh with endovein harvesting system.  Median sternotomy was performed.  Left internal mammary artery was dissected down as a pedicle graft.  Distal artery was divided and had good free flow.  Pericardium was opened. Overall ventricular function observed.  He was systemically heparinized. The ascending aorta was  cannulated.  The right atrium was cannulated and aortic root vent cardioplegia needle was introduced into the ascending aorta.  The patient was placed on cardiopulmonary bypass at 2.4 liters/minute/meter squared.  Sites of anastomosis were selected and dissected out the epicardium.  The patient's body temperature was cooled to 32 degrees.  Aortic crossclamp was applied, 500 mL of cold blood potassium cardioplegia was administered with diastolic arrest of the heart.  Myocardial  septal temperature was monitored throughout the crossclamp.  Attention was turned first to the OM-1 vessel and distal circumflex, both of these were severely diffusely diseased, though the first obtuse marginal was able to be opened, it was thickened vessel. Using a diamond-type side-to-side anastomosis was performed with a running 7-0 Prolene, distal extent of the same vein was then carried short distance to the distal circumflex vessel, which was also very diffusely diseased, poor quality vessel was opened and admitted a 1-mm probe.  Distally using a running 7-0 Prolene, distal anastomosis was performed.  Attention was then turned to the diagonal, which was also severely calcified and poor-quality vessel.  This was opened and admitted a 1-mm probe.  Using a running 7-0 Prolene, distal anastomosis was performed.  Attention was then turned to the left anterior descending coronary artery, which was also a poor-quality vessel admitting a 1-mm probe distally and with significant distal disease. Using a running 8-0 Prolene, left internal mammary artery was anastomosed to the left anterior descending coronary artery.  With release of the bulldog on the mammary artery, there was rise in myocardial septal temperature.  Bulldog was placed back on the mammary artery and with crossclamp still in place, two punch aortotomies were performed and each of the two vein grafts were anastomosed to the ascending aorta.  Air was evacuated from the grafts and the ascending aorta.  The aortic crossclamp was removed with a total crossclamp time of 98 minutes.  Sites of anastomosis were inspected and were free of bleeding.  Initially, the patient required DDD pacing, but then returned to a sinus rhythm in the 80s, and the pacer was turned off.  While on pump, he had been maintained on 3 mcg/kg/minute of dopamine.  This was continued.  He was then ventilated and weaned from cardiopulmonary bypass without  difficulty.  He remained hemodynamically stable. Followup hematocrit was approximately 28-29%.  He was decannulated in usual fashion.  Protamine sulfate was administered.  Platelets were also administered because of low platelet count while on pump and because of the concern of retroperitoneal bleed related to the combination of heparin administration and Plavix.  With the operative field hemostatic, a left chest tube and Blake mediastinal drain were left in place. Pericardium was closed, loosely reapproximated.  Sternum was closed with #6 stainless steel wire.  Fascia was closed with interrupted 0 Vicryl, running 3-0 Vicryl and subcutaneous tissue, 4-0 subcuticular stitch and skin edges.  Dry dressings were applied.  Sponge and needle counts were reported as correct at the completion of the procedure.  The patient tolerated the procedure with in spite of the presumed preoperative acute retroperitoneal bleed that increased our need to give blood transfusions up to 7 units of packed cells.  At the completion of the procedure, the patient's hematocrit was stable and will be closely followed postoperatively.     Sheliah Plane, MD     EG/MEDQ  D:  03/06/2012  T:  03/07/2012  Job:  [id number]

## 2012-03-07 NOTE — Progress Notes (Signed)
Clinical Social Work Department CLINICAL SOCIAL WORK PLACEMENT NOTE 03/07/2012  Patient:  [name],[name]  Account Number:  [id number] Admit date:  02/27/2012  Clinical Social Worker:  Bonnye Fava, LCSW  Date/time:  03/07/2012 04:30 PM  Clinical Social Work is seeking post-discharge placement for this patient at the following level of care:   SKILLED NURSING   (*CSW will update this form in Epic as items are completed)   03/07/2012  Patient/family provided with [facility] Department of Clinical Social Work's list of facilities offering this level of care within the geographic area requested by the patient (or if unable, by the patient's family).  03/07/2012  Patient/family informed of their freedom to choose among providers that offer the needed level of care, that participate in Medicare, Medicaid or managed care program needed by the patient, have an available bed and are willing to accept the patient.  03/07/2012  Patient/family informed of [facility]' ownership interest in [facility], as well as of the fact that they are under no obligation to receive care at this facility.  PASARR submitted to EDS on 03/07/2012 PASARR number received from EDS on   FL2 transmitted to all facilities in geographic area requested by pt/family on  03/07/2012 FL2 transmitted to all facilities within larger geographic area on   Patient informed that his/her managed care company has contracts with or will negotiate with  certain facilities, including the following:     Patient/family informed of bed offers received:   Patient chooses bed at  Physician recommends and patient chooses bed at    Patient to be transferred to  on   Patient to be transferred to facility by   The following physician request were entered in Epic:   Additional Comments:  Jodean Lima, [phone]

## 2012-03-08 ENCOUNTER — Inpatient Hospital Stay (HOSPITAL_COMMUNITY): Payer: Medicare Other

## 2012-03-08 LAB — TYPE AND SCREEN
ABO/RH(D): A NEG
Antibody Screen: NEGATIVE
Unit division: 0
Unit division: 0
Unit division: 0
Unit division: 0
Unit division: 0
Unit division: 0
Unit division: 0
Unit division: 0
Unit division: 0
Unit division: 0

## 2012-03-08 LAB — COMPREHENSIVE METABOLIC PANEL
ALT: 39 U/L (ref 0–53)
AST: 59 U/L — ABNORMAL HIGH (ref 0–37)
Albumin: 2.2 g/dL — ABNORMAL LOW (ref 3.5–5.2)
Alkaline Phosphatase: 52 U/L (ref 39–117)
BUN: 43 mg/dL — ABNORMAL HIGH (ref 6–23)
CO2: 23 mEq/L (ref 19–32)
Calcium: 7.5 mg/dL — ABNORMAL LOW (ref 8.4–10.5)
Chloride: 101 mEq/L (ref 96–112)
Creatinine, Ser: 2.46 mg/dL — ABNORMAL HIGH (ref 0.50–1.35)
GFR calc Af Amer: 28 mL/min — ABNORMAL LOW (ref >90)
GFR calc non Af Amer: 24 mL/min — ABNORMAL LOW (ref >90)
Glucose, Bld: 118 mg/dL — ABNORMAL HIGH (ref 70–99)
Potassium: 3.7 mEq/L (ref 3.5–5.1)
Sodium: 136 mEq/L (ref 135–145)
Total Bilirubin: 0.5 mg/dL (ref 0.3–1.2)
Total Protein: 4.9 g/dL — ABNORMAL LOW (ref 6.0–8.3)

## 2012-03-08 LAB — CBC
Hemoglobin: 8.4 g/dL — ABNORMAL LOW (ref 13.0–17.0)
Platelets: 108 10*3/uL — ABNORMAL LOW (ref 150–400)
RBC: 2.83 MIL/uL — ABNORMAL LOW (ref 4.22–5.81)
WBC: 11.5 10*3/uL — ABNORMAL HIGH (ref 4.0–10.5)

## 2012-03-08 LAB — GLUCOSE, CAPILLARY

## 2012-03-08 MED ORDER — POTASSIUM CHLORIDE CRYS ER 20 MEQ PO TBCR
20.0000 meq | EXTENDED_RELEASE_TABLET | Freq: Two times a day (BID) | ORAL | Status: AC
  Administered 2012-03-08 (×2): 20 meq via ORAL
  Filled 2012-03-08: qty 1

## 2012-03-08 MED ORDER — INSULIN ASPART 100 UNIT/ML ~~LOC~~ SOLN
0.0000 [IU] | SUBCUTANEOUS | Status: DC
  Administered 2012-03-08: 16 [IU] via SUBCUTANEOUS
  Administered 2012-03-08: 4 [IU] via SUBCUTANEOUS
  Administered 2012-03-08: 8 [IU] via SUBCUTANEOUS
  Administered 2012-03-09: 4 [IU] via SUBCUTANEOUS
  Administered 2012-03-09 (×2): 12 [IU] via SUBCUTANEOUS
  Administered 2012-03-09: 16 [IU] via SUBCUTANEOUS
  Administered 2012-03-09: 8 [IU] via SUBCUTANEOUS
  Administered 2012-03-09: 2 [IU] via SUBCUTANEOUS
  Administered 2012-03-10 (×2): 4 [IU] via SUBCUTANEOUS
  Administered 2012-03-10: 8 [IU] via SUBCUTANEOUS
  Administered 2012-03-10 (×2): 2 [IU] via SUBCUTANEOUS
  Administered 2012-03-10: 4 [IU] via SUBCUTANEOUS
  Administered 2012-03-10: 8 [IU] via SUBCUTANEOUS
  Administered 2012-03-11: 4 [IU] via SUBCUTANEOUS
  Administered 2012-03-11: 8 [IU] via SUBCUTANEOUS
  Administered 2012-03-11: 4 [IU] via SUBCUTANEOUS
  Administered 2012-03-11: 8 [IU] via SUBCUTANEOUS
  Administered 2012-03-11 (×2): 4 [IU] via SUBCUTANEOUS
  Administered 2012-03-12: 2 [IU] via SUBCUTANEOUS
  Administered 2012-03-12 (×2): 4 [IU] via SUBCUTANEOUS
  Administered 2012-03-12 (×2): 8 [IU] via SUBCUTANEOUS
  Administered 2012-03-12: 4 [IU] via SUBCUTANEOUS
  Administered 2012-03-13: 2 [IU] via SUBCUTANEOUS
  Administered 2012-03-13: 4 [IU] via SUBCUTANEOUS
  Administered 2012-03-13 – 2012-03-14 (×3): 2 [IU] via SUBCUTANEOUS
  Administered 2012-03-14 (×3): 4 [IU] via SUBCUTANEOUS
  Administered 2012-03-14: 2 [IU] via SUBCUTANEOUS
  Administered 2012-03-14: 4 [IU] via SUBCUTANEOUS
  Administered 2012-03-15: 2 [IU] via SUBCUTANEOUS
  Administered 2012-03-15 (×2): 4 [IU] via SUBCUTANEOUS
  Administered 2012-03-15: 2 [IU] via SUBCUTANEOUS
  Administered 2012-03-15: 4 [IU] via SUBCUTANEOUS
  Administered 2012-03-18 (×4): 2 [IU] via SUBCUTANEOUS
  Administered 2012-03-19: 4 [IU] via SUBCUTANEOUS
  Administered 2012-03-19: 8 [IU] via SUBCUTANEOUS
  Administered 2012-03-19 (×2): 2 [IU] via SUBCUTANEOUS
  Administered 2012-03-20: 8 [IU] via SUBCUTANEOUS
  Administered 2012-03-20: 2 [IU] via SUBCUTANEOUS
  Administered 2012-03-20: 8 [IU] via SUBCUTANEOUS
  Administered 2012-03-20: 4 [IU] via SUBCUTANEOUS
  Administered 2012-03-21 (×2): 2 [IU] via SUBCUTANEOUS
  Administered 2012-03-21: 4 [IU] via SUBCUTANEOUS
  Administered 2012-03-21 (×2): 2 [IU] via SUBCUTANEOUS
  Administered 2012-03-21: 8 [IU] via SUBCUTANEOUS
  Administered 2012-03-22: 2 [IU] via SUBCUTANEOUS
  Administered 2012-03-22: 8 [IU] via SUBCUTANEOUS
  Administered 2012-03-22: 4 [IU] via SUBCUTANEOUS
  Administered 2012-03-22: 8 [IU] via SUBCUTANEOUS
  Administered 2012-03-22 – 2012-03-23 (×3): 4 [IU] via SUBCUTANEOUS
  Administered 2012-03-23: 2 [IU] via SUBCUTANEOUS
  Administered 2012-03-23 (×2): 4 [IU] via SUBCUTANEOUS
  Administered 2012-03-24: 2 [IU] via SUBCUTANEOUS
  Administered 2012-03-24: 12 [IU] via SUBCUTANEOUS
  Administered 2012-03-24: 2 [IU] via SUBCUTANEOUS
  Administered 2012-03-24 (×2): 8 [IU] via SUBCUTANEOUS
  Administered 2012-03-24 – 2012-03-25 (×2): 4 [IU] via SUBCUTANEOUS
  Administered 2012-03-25 (×2): 12 [IU] via SUBCUTANEOUS
  Administered 2012-03-25 (×2): 2 [IU] via SUBCUTANEOUS
  Administered 2012-03-26: 4 [IU] via SUBCUTANEOUS

## 2012-03-08 MED FILL — Sodium Bicarbonate IV Soln 8.4%: INTRAVENOUS | Qty: 100 | Status: AC

## 2012-03-08 MED FILL — Heparin Sodium (Porcine) Inj 1000 Unit/ML: INTRAMUSCULAR | Qty: 30 | Status: AC

## 2012-03-08 MED FILL — Mannitol IV Soln 20%: INTRAVENOUS | Qty: 500 | Status: AC

## 2012-03-08 MED FILL — Sodium Chloride Irrigation Soln 0.9%: Qty: 3000 | Status: AC

## 2012-03-08 MED FILL — Sodium Chloride IV Soln 0.9%: INTRAVENOUS | Qty: 1000 | Status: AC

## 2012-03-08 MED FILL — Electrolyte-R (PH 7.4) Solution: INTRAVENOUS | Qty: 3000 | Status: AC

## 2012-03-08 MED FILL — Lidocaine HCl IV Inj 20 MG/ML: INTRAVENOUS | Qty: 5 | Status: AC

## 2012-03-08 MED FILL — Heparin Sodium (Porcine) Inj 1000 Unit/ML: INTRAMUSCULAR | Qty: 10 | Status: AC

## 2012-03-08 NOTE — Progress Notes (Signed)
Patient ID: [name], male   DOB: [birth year], 71 y.o.   MRN: [id number] TCTS DAILY PROGRESS NOTE                   [address]            [place] [address]          [phone]      3 Days Post-Op Procedure(s) (LRB): CORONARY ARTERY BYPASS GRAFTING (CABG) (N/A)  Total Length of Stay:  LOS: 10 days   Subjective: Feels better and talking more   Objective: Vital signs in last 24 hours: Temp:  [97.5 F (36.4 C)-99.2 F (37.3 C)] 99.2 F (37.3 C) (09/19 0722) Pulse Rate:  [73-99] 86  (09/19 0800) Cardiac Rhythm:  [-] Normal sinus rhythm (09/19 0800) Resp:  [11-38] 25  (09/19 0800) BP: (103-134)/(49-73) 122/60 mmHg (09/19 0800) SpO2:  [91 %-99 %] 99 % (09/19 0843) Weight:  [287 lb 0.6 oz (130.2 kg)] 287 lb 0.6 oz (130.2 kg) (09/19 0500)  Filed Weights   03/06/12 0500 03/07/12 0500 03/08/12 0500  Weight: 288 lb 5.8 oz (130.8 kg) 288 lb 2.3 oz (130.7 kg) 287 lb 0.6 oz (130.2 kg)    Weight change: -1 lb 1.6 oz (-0.5 kg)   Hemodynamic parameters for last 24 hours:    Intake/Output from previous day: 09/18 0701 - 09/19 0700 In: 2220.4 [P.O.:660; I.V.:1378.4; IV Piggyback:182] Out: 3506 [Urine:3425; Stool:1; Chest Tube:80]  Intake/Output this shift: Total I/O In: 126.5 [I.V.:126.5] Out: 270 [Urine:200; Chest Tube:70]  Current Meds: Scheduled Meds:   . acetaminophen  1,000 mg Oral Q6H   Or  . acetaminophen (TYLENOL) oral liquid 160 mg/5 mL  975 mg Per Tube Q6H  . aspirin EC  325 mg Oral Daily   Or  . aspirin  324 mg Per Tube Daily  . atorvastatin  80 mg Oral q1800  . bisacodyl  10 mg Oral Daily   Or  . bisacodyl  10 mg Rectal Daily  . brimonidine  1 drop Left Eye BID  . cefUROXime (ZINACEF)  IV  1.5 g Intravenous Q12H  . docusate sodium  200 mg Oral Daily  . dorzolamide-timolol  1 drop Left Eye BID  . erythromycin  1 application Right Eye BID  . ezetimibe  10 mg Oral Daily  . ferrous sulfate  325 mg Oral Q breakfast  . furosemide  160  mg Intravenous Q8H  . insulin aspart  0-24 Units Subcutaneous Q4H  . insulin glargine  15 Units Subcutaneous BID  . insulin glargine  5 Units Subcutaneous Once  . ipratropium  1 spray Nasal BID  . levalbuterol  0.63 mg Nebulization TID  . loteprednol  1 drop Right Eye BID  . magnesium sulfate  4 g Intravenous Once  . metoprolol tartrate  12.5 mg Oral BID   Or  . metoprolol tartrate  12.5 mg Per Tube BID  . pantoprazole  40 mg Oral Q1200  . polyvinyl alcohol  1 drop Both Eyes BID  . sodium chloride  3 mL Intravenous Q12H  . DISCONTD: furosemide  160 mg Intravenous Q8H  . DISCONTD: furosemide  80 mg Intravenous BID  . DISCONTD: insulin aspart  0-24 Units Subcutaneous Q4H  . DISCONTD: insulin aspart  0-24 Units Subcutaneous Q2H  . DISCONTD: insulin glargine  18 Units Subcutaneous Daily  . DISCONTD: insulin glargine  20 Units Subcutaneous Daily   Continuous Infusions:   . sodium chloride 20 mL/hr at  03/05/12 1430  . sodium chloride 50 mL/hr at 03/05/12 1400  . sodium chloride    . amiodarone (NEXTERONE PREMIX) 360 mg/200 mL dextrose 0.5 mg/min (03/08/12 0714)  . DOPamine 3 mcg/kg/min (03/07/12 1300)  . lactated ringers    . nitroGLYCERIN Stopped (03/05/12 1400)  . phenylephrine (NEO-SYNEPHRINE) Adult infusion Stopped (03/06/12 1100)  . DISCONTD: dexmedetomidine Stopped (03/05/12 1700)   PRN Meds:.albuterol, metoprolol, midazolam, morphine injection, ondansetron (ZOFRAN) IV, oxyCODONE, sodium chloride  General appearance: alert, cooperative and no distress Neurologic: intact Heart: regular rate and rhythm, S1, S2 normal, no murmur, click, rub or gallop and normal apical impulse Lungs: wheezes bilaterally Abdomen: soft, non-tender; bowel sounds normal; no masses,  no organomegaly and obese but not tender Extremities: extremities normal, atraumatic, no cyanosis or edema, Homans sign is negative, no sign of DVT and no edema, redness or tenderness in the calves or thighs Wound:  sternum stable  Lab Results: CBC: Basename 03/08/12 0400 03/07/12 0400  WBC 11.5* 13.4*  HGB 8.4* 8.6*  HCT 24.5* 25.6*  PLT 108* 99*   BMET:  Basename 03/08/12 0400 03/07/12 0400  NA 136 133*  K 3.7 4.3  CL 101 101  CO2 23 22  GLUCOSE 118* 176*  BUN 43* 38*  CREATININE 2.46* 2.60*  CALCIUM 7.5* 7.4*    PT/INR:  Basename 03/05/12 1355  LABPROT 18.1*  INR 1.47   Radiology:     Ct Abdomen Pelvis Wo Contrast  03/07/2012  *RADIOLOGY REPORT*  Clinical Data: Left-sided abdominal/flank pain.  Recent cardiac catheterization.  Dropping hemoglobin.  CT ABDOMEN AND PELVIS WITHOUT CONTRAST  Technique:  Multidetector CT imaging of the abdomen and pelvis was performed following the standard protocol without intravenous contrast.  Comparison: None.  Findings: The lung bases demonstrate small effusions and bibasilar atelectasis.  A left-sided chest tube is in place.  There is a large retroperitoneal hematoma on the left this extends from just below the spleen all way down into the extraperitoneal pelvis.  Moderate-sized hematoma also involves the left psoas muscle.  The solid abdominal organs are intact. Bilateral adrenal gland adenomas are noted.  No mesenteric or retroperitoneal mass or adenopathy.  The aorta demonstrates moderate atherosclerotic calcifications.  No focal aneurysm.  There is a Foley catheter in the bladder.  Prostate gland and seminal vesicles are unremarkable.  No inguinal mass or hernia. There is a small right groin hematoma likely from a recent cardiac catheterization.  The bony structures are intact.  Remote healed pubic rami fractures are noted on the right side.  IMPRESSION: Large retroperitoneal abdominal and pelvic hematoma and left psoas hematoma.   Original Report Authenticated By: P. Loralie Champagne, M.D.    Dg Chest Port 1 View  03/08/2012  *RADIOLOGY REPORT*  Clinical Data: Postop CABG  PORTABLE CHEST - 1 VIEW  Comparison: 03/07/2012; 03/06/2012; 03/05/2012  Findings:  Grossly unchanged cardiac silhouette and mediastinal contours to be decreased lung volumes of patient rotation.  Post median sternotomy and CABG.  Stable positioning of support apparatus.  No definite pneumothorax.  Lung volumes remain persistently reduced with perihilar and bibasilar opacities.  Query small bilateral effusions, though evaluation is limited secondary to exclusion of the bilateral costophrenic angles.  Unchanged bones.  IMPRESSION: 1.  Stable positioning of support apparatus.  No pneumothorax. 2.  Persistently reduced lung volumes with perihilar and bibasilar opacities, possibly atelectasis.   Original Report Authenticated By: Waynard Reeds, M.D.    Dg Chest Port 1 View  03/07/2012  *RADIOLOGY REPORT*  Clinical Data: Postop CABG 03/05/2012.  PORTABLE CHEST - 1 VIEW  Comparison: 03/06/2012.  Findings: 0550 hours.  Interval extubation with removal of the nasogastric tube and right IJ Swan-Ganz catheter.  A right IJ sheath and left chest tube remain in place.  There are persistent low lung volumes with bibasilar atelectasis and a possible small left pleural effusion.  No pneumothorax is evident.  Heart size and mediastinal contours are stable allowing for rotation and the low lung volumes.  Glenohumeral degenerative changes are noted.  IMPRESSION: No significant change in bibasilar atelectasis following extubation.  No pneumothorax.   Original Report Authenticated By: Gerrianne Scale, M.D.      Assessment/Plan: S/P Procedure(s) (LRB): CORONARY ARTERY BYPASS GRAFTING (CABG) (N/A) Mobilize Diuresis Diabetes control d/c tubes/lines retroperotineal hematoma on ct as suspected  hct stable Pt , will need rehab to recovery strength    [name] MD  Beeper [phone] Office [phone] 03/08/2012 8:59 AM

## 2012-03-08 NOTE — Progress Notes (Signed)
SUBJECTIVE: complains of difficulty feeling his left leg2  OBJECTIVE:   Vitals:   Filed Vitals:   03/08/12 0700 03/08/12 0722 03/08/12 0800 03/08/12 0843  BP: 127/71  122/60   Pulse: 80  86   Temp:  99.2 F (37.3 C)    TempSrc:  Oral    Resp: 24  25   Height:      Weight:      SpO2: 97%  97% 99%   I&O's:   Intake/Output Summary (Last 24 hours) at 03/08/12 1008 Last data filed at 03/08/12 0800  Gross per 24 hour  Intake 1984.46 ml  Output   3585 ml  Net -1600.54 ml   TELEMETRY: Reviewed telemetry pt in NSR     PHYSICAL EXAM General: Well developed, well nourished, in no acute distress Head: Eyes PERRLA, No xanthomas.   Normal cephalic and atramatic  Lungs:   Clear bilaterally to auscultation and percussion. Heart:   HRRR S1 S2 Pulses are 2+ & equal. Abdomen: Bowel sounds are positive, abdomen soft and non-tender without masse Neuro: Alert and oriented X 3.  Moves both feet without problems.  Sensation intact in both feet but decreased in upper thigh on left leg Psych:  Good affect, responds appropriately   LABS: Basic Metabolic Panel:  Basename 03/08/12 0400 03/07/12 0400 03/06/12 1656 03/06/12 0410  NA 136 133* -- --  K 3.7 4.3 -- --  CL 101 101 -- --  CO2 23 22 -- --  GLUCOSE 118* 176* -- --  BUN 43* 38* -- --  CREATININE 2.46* 2.60* -- --  CALCIUM 7.5* 7.4* -- --  MG -- -- 1.7 1.8  PHOS -- -- -- --   Liver Function Tests:  Basename 03/08/12 0400 03/05/12 2000  AST 59* 38*  ALT 39 24  ALKPHOS 52 29*  BILITOT 0.5 0.5  PROT 4.9* 4.3*  ALBUMIN 2.2* 2.5*   No results found for this basename: LIPASE:2,AMYLASE:2 in the last 72 hours CBC:  Basename 03/08/12 0400 03/07/12 0400  WBC 11.5* 13.4*  NEUTROABS -- --  HGB 8.4* 8.6*  HCT 24.5* 25.6*  MCV 86.6 86.2  PLT 108* 99*   Cardiac Enzymes: No results found for this basename: CKTOTAL:3,CKMB:3,CKMBINDEX:3,TROPONINI:3 in the last 72 hours BNP: No components found with this basename:  POCBNP:3 D-Dimer: No results found for this basename: DDIMER:2 in the last 72 hours Hemoglobin A1C: No results found for this basename: HGBA1C in the last 72 hours Fasting Lipid Panel: No results found for this basename: CHOL,HDL,LDLCALC,TRIG,CHOLHDL,LDLDIRECT in the last 72 hours Thyroid Function Tests:  Basename 03/05/12 2000  TSH 2.856  T4TOTAL --  T3FREE --  THYROIDAB --   Anemia Panel: No results found for this basename: VITAMINB12,FOLATE,FERRITIN,TIBC,IRON,RETICCTPCT in the last 72 hours Coag Panel:   Lab Results  Component Value Date   INR 1.47 03/05/2012   INR 1.01 02/27/2012    RADIOLOGY: Ct Abdomen Pelvis Wo Contrast  03/07/2012  *RADIOLOGY REPORT*  Clinical Data: Left-sided abdominal/flank pain.  Recent cardiac catheterization.  Dropping hemoglobin.  CT ABDOMEN AND PELVIS WITHOUT CONTRAST  Technique:  Multidetector CT imaging of the abdomen and pelvis was performed following the standard protocol without intravenous contrast.  Comparison: None.  Findings: The lung bases demonstrate small effusions and bibasilar atelectasis.  A left-sided chest tube is in place.  There is a large retroperitoneal hematoma on the left this extends from just below the spleen all way down into the extraperitoneal pelvis.  Moderate-sized hematoma also involves the left psoas  muscle.  The solid abdominal organs are intact. Bilateral adrenal gland adenomas are noted.  No mesenteric or retroperitoneal mass or adenopathy.  The aorta demonstrates moderate atherosclerotic calcifications.  No focal aneurysm.  There is a Foley catheter in the bladder.  Prostate gland and seminal vesicles are unremarkable.  No inguinal mass or hernia. There is a small right groin hematoma likely from a recent cardiac catheterization.  The bony structures are intact.  Remote healed pubic rami fractures are noted on the right side.  IMPRESSION: Large retroperitoneal abdominal and pelvic hematoma and left psoas hematoma.   Original  Report Authenticated By: P. Loralie Champagne, M.D.    Dg Chest 2 View  02/27/2012  *RADIOLOGY REPORT*  Clinical Data: Preop  CHEST - 2 VIEW  Comparison: 08/21/2007  Findings: Normal heart size.  Lungs are under aerated with bibasilar atelectasis.  No pneumothorax and no pleural effusion.  IMPRESSION: Bibasilar atelectasis.   Original Report Authenticated By: Donavan Burnet, M.D.    Dg Chest Port 1 View  03/08/2012  *RADIOLOGY REPORT*  Clinical Data: Postop CABG  PORTABLE CHEST - 1 VIEW  Comparison: 03/07/2012; 03/06/2012; 03/05/2012  Findings: Grossly unchanged cardiac silhouette and mediastinal contours to be decreased lung volumes of patient rotation.  Post median sternotomy and CABG.  Stable positioning of support apparatus.  No definite pneumothorax.  Lung volumes remain persistently reduced with perihilar and bibasilar opacities.  Query small bilateral effusions, though evaluation is limited secondary to exclusion of the bilateral costophrenic angles.  Unchanged bones.  IMPRESSION: 1.  Stable positioning of support apparatus.  No pneumothorax. 2.  Persistently reduced lung volumes with perihilar and bibasilar opacities, possibly atelectasis.   Original Report Authenticated By: Waynard Reeds, M.D.    Dg Chest Port 1 View  03/07/2012  *RADIOLOGY REPORT*  Clinical Data: Postop CABG 03/05/2012.  PORTABLE CHEST - 1 VIEW  Comparison: 03/06/2012.  Findings: 0550 hours.  Interval extubation with removal of the nasogastric tube and right IJ Swan-Ganz catheter.  A right IJ sheath and left chest tube remain in place.  There are persistent low lung volumes with bibasilar atelectasis and a possible small left pleural effusion.  No pneumothorax is evident.  Heart size and mediastinal contours are stable allowing for rotation and the low lung volumes.  Glenohumeral degenerative changes are noted.  IMPRESSION: No significant change in bibasilar atelectasis following extubation.  No pneumothorax.   Original Report  Authenticated By: Gerrianne Scale, M.D.    Dg Chest Portable 1 View In Am  03/06/2012  *RADIOLOGY REPORT*  Clinical Data: Bypass surgery.  PORTABLE CHEST - 1 VIEW  Comparison: 03/05/2012.  Findings: The endotracheal tube, NG tube and right IJ Swan-Ganz catheters are stable.  The left-sided chest tube is unchanged.  No pneumothorax.  Persistent low lung volumes with vascular crowding and bibasilar atelectasis.  A small left pleural effusion persists.  IMPRESSION:  1.  Stable support apparatus. 2.  Low lung volumes with vascular crowding, basilar atelectasis and small left effusion.   Original Report Authenticated By: P. Loralie Champagne, M.D.    Dg Chest Portable 1 View  03/05/2012  *RADIOLOGY REPORT*  Clinical Data: Bypass surgery.  PORTABLE CHEST - 1 VIEW  Comparison: 03/08/2012.  Findings: The endotracheal tube is in good position, 5 cm above the carina.  The right IJ Swan-Ganz catheter tip was in the proximal right pulmonary artery.  The NG tube is in the stomach.  There is a left-sided chest tube in good position.  No pneumothorax.  Low lung volumes with vascular crowding and atelectasis.  No edema, effusions or  pneumothorax.  IMPRESSION:  1.  Support apparatus in good position without complicating features. 2.  Low lung volumes with vascular crowding and atelectasis.   Original Report Authenticated By: P. Loralie Champagne, M.D.       ASSESSMENT:  1.  CAD s/p CABG 2.  Post-op afib in NSR on IV Amio 3.  Left retroperiotoneal bleed - most likley etiology of patient's decreased sensation on left leg - Dr. Tyrone Sage aware  PLAN:  1. Continue amio for suppression of afib  Quintella Reichert, MD  03/08/2012  10:08 AM

## 2012-03-08 NOTE — Progress Notes (Signed)
Stable day  BP 138/61  Pulse 79  Temp 98.1 F (36.7 C) (Oral)  Resp 17  Ht 5\' 10"  (1.778 m)  Wt 287 lb 0.6 oz (130.2 kg)  BMI 41.19 kg/m2  SpO2 97%   Intake/Output Summary (Last 24 hours) at 03/08/12 1722 Last data filed at 03/08/12 1500  Gross per 24 hour  Intake 1913.46 ml  Output   4240 ml  Net -2326.54 ml    Remains on low dose dopamine and IV amiodarone

## 2012-03-08 NOTE — Progress Notes (Signed)
[facility] Progress Note    Subjective:   Patient with increased furosemide dose to 400 mg IV total yesterday. No acute events overnight. He is complaining of decreased sensation and strength in his left leg.   CT scan yesterday noted large retroperitoneal and left psoas hematoma .  Better UOP with increased lasix   Objective:   BP 127/71  Pulse 80  Temp 99.2 F (37.3 C) (Oral)  Resp 24  Ht 5\' 10"  (1.778 m)  Wt 287 lb 0.6 oz (130.2 kg)  BMI 41.19 kg/m2  SpO2 97%  Intake/Output Summary (Last 24 hours) at 03/08/12 1610 Last data filed at 03/08/12 9604  Gross per 24 hour  Intake 2225.26 ml  Output   3496 ml  Net -1270.74 ml   Weight change: -1 lb 1.6 oz (-0.5 kg)  Physical Exam: General:  Vital signs reviewed and noted. Elderly WM, obese body habitus, fatigued but awakes, numerous acces points and bandages throughout  Head:  Normocephalic, atraumatic.  Eyes:  Right eye blindness with enucleation,  Nose:  Mucous membranes moist, not inflammed, nonerythematous.  Throat:  Oropharynx nonerythematous, no exudate appreciated.  Neck:  Right central cath in IJ; very short, thick neck  Lungs:  Patient on 2L n/c, Poor respiratory effort, decreased breath sounds in lower lobes bilaterally with expiratory wheezes in upper lobes bilaterally, no rales or rhonchi  Chest  Central midline incision with bandage covering, left-sided chest tube  Heart:  RRR. Distant heart sounds  Abdomen:  Protuberant, distended, non-tender, hypoactive bowel sounds.  Extremities:  2+ pitting edema of hands, 1+ pitting edema of feet, no pretibial edema.RUE with AVF without palpable thrill; lower extremities with medial thigh incisions bilaterally  GU:  Extensive ecchymosis and hematoma of right inguinal area with scrotal edema of ecchymosis  Neurologic:  Decreased hearing so could not assess orientation; strength testing limited by numerous lines and bandages  Skin:  No visible  rashes, scars.    Imaging: Ct Abdomen Pelvis Wo Contrast  03/07/2012  *RADIOLOGY REPORT*  Clinical Data: Left-sided abdominal/flank pain.  Recent cardiac catheterization.  Dropping hemoglobin.  CT ABDOMEN AND PELVIS WITHOUT CONTRAST  Technique:  Multidetector CT imaging of the abdomen and pelvis was performed following the standard protocol without intravenous contrast.  Comparison: None.  Findings: The lung bases demonstrate small effusions and bibasilar atelectasis.  A left-sided chest tube is in place.  There is a large retroperitoneal hematoma on the left this extends from just below the spleen all way down into the extraperitoneal pelvis.  Moderate-sized hematoma also involves the left psoas muscle.  The solid abdominal organs are intact. Bilateral adrenal gland adenomas are noted.  No mesenteric or retroperitoneal mass or adenopathy.  The aorta demonstrates moderate atherosclerotic calcifications.  No focal aneurysm.  There is a Foley catheter in the bladder.  Prostate gland and seminal vesicles are unremarkable.  No inguinal mass or hernia. There is a small right groin hematoma likely from a recent cardiac catheterization.  The bony structures are intact.  Remote healed pubic rami fractures are noted on the right side.  IMPRESSION: Large retroperitoneal abdominal and pelvic hematoma and left psoas hematoma.   Original Report Authenticated By: P. Loralie Champagne, M.D.    Dg Chest Port 1 View  03/07/2012  *RADIOLOGY REPORT*  Clinical Data: Postop CABG 03/05/2012.  PORTABLE CHEST - 1 VIEW  Comparison: 03/06/2012.  Findings: 0550 hours.  Interval extubation with removal of the nasogastric tube and right IJ Swan-Ganz catheter.  A right  IJ sheath and left chest tube remain in place.  There are persistent low lung volumes with bibasilar atelectasis and a possible small left pleural effusion.  No pneumothorax is evident.  Heart size and mediastinal contours are stable allowing for rotation and the low lung  volumes.  Glenohumeral degenerative changes are noted.  IMPRESSION: No significant change in bibasilar atelectasis following extubation.  No pneumothorax.   Original Report Authenticated By: Gerrianne Scale, M.D.     Labs: BMET  Lab 03/08/12 0400 03/07/12 0400 03/06/12 1656 03/06/12 1651 03/06/12 0410 03/05/12 2000 03/05/12 1958 03/05/12 1405 03/05/12 0947 03/04/12 0534 03/03/12 0513  NA 136 133* 131* 137 136 -- 140 136 -- -- --  K 3.7 4.3 4.3 4.6 4.7 -- 4.8 4.8 -- -- --  CL 101 101 99 102 105 -- 106 -- 103 -- --  CO2 23 22 22  -- 23 -- -- -- 19 23 23   GLUCOSE 118* 176* 200* 192* 146* -- 63* 251* -- -- --  BUN 43* 38* 36* 34* 34* -- 31* -- 32* -- --  CREATININE 2.46* 2.60* 2.51* 2.70* 2.29* 2.16* 2.20* -- -- -- --  ALB -- -- -- -- -- -- -- -- -- -- --  CALCIUM 7.5* 7.4* 7.4* -- 7.4* -- -- -- 7.6* 9.2 9.0  PHOS -- -- -- -- -- -- -- -- -- -- --   CBC  Lab 03/08/12 0400 03/07/12 0400 03/06/12 2125 03/06/12 1656 03/06/12 1213 03/06/12 0410  WBC 11.5* 13.4* -- -- 13.8* 15.5*  NEUTROABS -- -- -- -- -- --  HGB 8.4* 8.6* 9.0* 8.8* -- --  HCT 24.5* 25.6* 26.3* 26.1* -- --  MCV 86.6 86.2 -- -- 85.2 83.7  PLT 108* 99* -- -- 86* 102*    Medications:       . acetaminophen  1,000 mg Oral Q6H   Or  . acetaminophen (TYLENOL) oral liquid 160 mg/5 mL  975 mg Per Tube Q6H  . aspirin EC  325 mg Oral Daily   Or  . aspirin  324 mg Per Tube Daily  . atorvastatin  80 mg Oral q1800  . bisacodyl  10 mg Oral Daily   Or  . bisacodyl  10 mg Rectal Daily  . brimonidine  1 drop Left Eye BID  . cefUROXime (ZINACEF)  IV  1.5 g Intravenous Q12H  . docusate sodium  200 mg Oral Daily  . dorzolamide-timolol  1 drop Left Eye BID  . erythromycin  1 application Right Eye BID  . ezetimibe  10 mg Oral Daily  . ferrous sulfate  325 mg Oral Q breakfast  . furosemide  160 mg Intravenous Q8H  . insulin aspart  0-24 Units Subcutaneous Q4H  . insulin glargine  15 Units Subcutaneous BID  . insulin glargine  5  Units Subcutaneous Once  . ipratropium  1 spray Nasal BID  . levalbuterol  0.63 mg Nebulization TID  . loteprednol  1 drop Right Eye BID  . magnesium sulfate  4 g Intravenous Once  . metoprolol tartrate  12.5 mg Oral BID   Or  . metoprolol tartrate  12.5 mg Per Tube BID  . pantoprazole  40 mg Oral Q1200  . polyvinyl alcohol  1 drop Both Eyes BID  . sodium chloride  3 mL Intravenous Q12H  . DISCONTD: furosemide  160 mg Intravenous Q8H  . DISCONTD: furosemide  80 mg Intravenous BID  . DISCONTD: insulin aspart  0-24 Units Subcutaneous Q4H  . DISCONTD:  insulin aspart  0-24 Units Subcutaneous Q2H  . DISCONTD: insulin glargine  15 Units Subcutaneous BH-q7a  . DISCONTD: insulin glargine  18 Units Subcutaneous Daily  . DISCONTD: insulin glargine  20 Units Subcutaneous Daily  . DISCONTD: insulin regular  0-10 Units Intravenous TID WC     Assessment/ Plan:   Pt is a 71 y.o. yo male with baseline CKD stage III, COPD, DM2, and CAD s/p CABG on 03/05/12, who is experiencing post-operative acute kidney injury.   Acute Kidney Injury - The patient has well documented CKD - diabetic nephropathy with a baseline creatinine of 1.7 prior to admission. He is a patient of Dr. Eliott Nine at [facility], who prescribed the patient ramipril for treatment of his proteinuria. It appears that this medication was stopped prior to his cardiac catheterization, and his acute kidney injury did not occur until after a week after the catheterization, making contrast nephropathy very unlikely. The patient had a significant retroperitoneal bleed overnight prior to surgery, which seemed to set off the bump in creatinine. This indicates that the blood loss likely caused acute tubular necrosis, evidenced by a BUN/Cr < 15.  - Patient started on IV lasix 9/18 with mild diuresis and improvement in creatinine to 2.46.  - Patient still up 8L since admission so cont. furosemide 160 mg q 8 hours to promote diuresis  - Stable  BP yesterday which also likely helped kidney function improve overnight Stable so far with current measures.  Volume is the major issue, continue high dose diuresis  Bone Health - Correct calcium approximately 9, so no need for supplementation right now   Normocytic Anemia - Likely secondary to acute blood loss from retroperitoneal bleed and blood loss from surgery. It is unlikely that this is related to kidney function, as his hgb was approximately 12 on the day of admission.  - Hgb stable at 8.4 and patient noted to have large hematoma which likely sequestered lots of blood - continue FeSO4 per primary team   Cardiology - Patient s/p 4 vessel CABG on 9/16.  - currently on dopamine and amiodarone drips primary team   DVT PPX - SCD's in place. Management for primary team.   Will give supplemental K and Mag as well.    Si Raider Clinton Sawyer, MD   03/08/2012, 8:08 AM   Patient seen and examined, agree with above note with above modifications. 71 year old WM with acute on chronic renal failure in the setting of cath and CABG.  Fortunately stable overnight with good UOP with lasix.  Continue lasix and replete K and mag and continue to follow, no indications for HD Annie Sable, MD 03/08/2012      03/08/2012

## 2012-03-08 NOTE — Progress Notes (Signed)
Inpatient Diabetes Program Recommendations  AACE/ADA: New Consensus Statement on Inpatient Glycemic Control (2013)  Target Ranges:  Prepandial:   less than 140 mg/dL      Peak postprandial:   less than 180 mg/dL (1-2 hours)      Critically ill patients:  140 - 180 mg/dL   Results for [name], [name] (MRN [id number]) as of 03/08/2012 14:22  Ref. Range 03/07/2012 07:27 03/07/2012 12:19 03/07/2012 15:41 03/07/2012 19:26 03/07/2012 22:26  Glucose-Capillary Latest Range: 70-99 mg/dL 409 (H) 811 (H) 914 (H) 206 (H) 216 (H)   Results for [name], [name] (MRN [id number]) as of 03/08/2012 14:22  Ref. Range 03/08/2012 07:17 03/08/2012 11:18  Glucose-Capillary Latest Range: 70-99 mg/dL 77 086 (H)   Fasting sugars improved.  Patient still having issues with postprandial elevations.  May need meal coverage to help with postprandial elevations.  Inpatient Diabetes Program Recommendations Insulin - Basal: Currently on Lantus 15 units bid. Correction (SSI): Please change Novolog SSI to ac + HS (currently ordered Q4 hours and patient is eating PO diet). Insulin - Meal Coverage: Please add meal coverage- Novolog 4 units tid with meals.  Note: Will follow. Ambrose Finland RN, MSN, CDE Diabetes Coordinator Inpatient Diabetes Program [phone]

## 2012-03-09 ENCOUNTER — Inpatient Hospital Stay (HOSPITAL_COMMUNITY): Payer: Medicare Other

## 2012-03-09 LAB — RENAL FUNCTION PANEL
BUN: 48 mg/dL — ABNORMAL HIGH (ref 6–23)
CO2: 24 mEq/L (ref 19–32)
Calcium: 8.1 mg/dL — ABNORMAL LOW (ref 8.4–10.5)
Creatinine, Ser: 2.35 mg/dL — ABNORMAL HIGH (ref 0.50–1.35)
Glucose, Bld: 154 mg/dL — ABNORMAL HIGH (ref 70–99)

## 2012-03-09 LAB — CBC
HCT: 26.7 % — ABNORMAL LOW (ref 39.0–52.0)
Hemoglobin: 9.2 g/dL — ABNORMAL LOW (ref 13.0–17.0)
RDW: 17.2 % — ABNORMAL HIGH (ref 11.5–15.5)
WBC: 11.1 10*3/uL — ABNORMAL HIGH (ref 4.0–10.5)

## 2012-03-09 LAB — GLUCOSE, CAPILLARY

## 2012-03-09 MED ORDER — AMIODARONE HCL 200 MG PO TABS
200.0000 mg | ORAL_TABLET | Freq: Every day | ORAL | Status: DC
  Administered 2012-03-16 – 2012-03-27 (×12): 200 mg via ORAL
  Filled 2012-03-09 (×12): qty 1

## 2012-03-09 MED ORDER — METOPROLOL TARTRATE 25 MG/10 ML ORAL SUSPENSION
25.0000 mg | Freq: Two times a day (BID) | ORAL | Status: DC
  Filled 2012-03-09 (×16): qty 10

## 2012-03-09 MED ORDER — AMIODARONE HCL 200 MG PO TABS
200.0000 mg | ORAL_TABLET | Freq: Two times a day (BID) | ORAL | Status: AC
  Administered 2012-03-09 – 2012-03-15 (×14): 200 mg via ORAL
  Filled 2012-03-09 (×14): qty 1

## 2012-03-09 MED ORDER — METOPROLOL TARTRATE 25 MG PO TABS
25.0000 mg | ORAL_TABLET | Freq: Two times a day (BID) | ORAL | Status: DC
  Administered 2012-03-09 – 2012-03-15 (×13): 25 mg via ORAL
  Filled 2012-03-09 (×16): qty 1

## 2012-03-09 MED ORDER — LACTULOSE 10 GM/15ML PO SOLN
20.0000 g | Freq: Once | ORAL | Status: AC
  Administered 2012-03-09: 20 g via ORAL
  Filled 2012-03-09: qty 30

## 2012-03-09 MED ORDER — FUROSEMIDE 10 MG/ML IJ SOLN
80.0000 mg | Freq: Three times a day (TID) | INTRAMUSCULAR | Status: DC
  Administered 2012-03-09 – 2012-03-10 (×3): 80 mg via INTRAVENOUS
  Filled 2012-03-09 (×7): qty 8

## 2012-03-09 MED ORDER — INSULIN GLARGINE 100 UNIT/ML ~~LOC~~ SOLN
18.0000 [IU] | Freq: Two times a day (BID) | SUBCUTANEOUS | Status: DC
  Administered 2012-03-09: 18 [IU] via SUBCUTANEOUS

## 2012-03-09 MED ORDER — INSULIN GLARGINE 100 UNIT/ML ~~LOC~~ SOLN
22.0000 [IU] | Freq: Two times a day (BID) | SUBCUTANEOUS | Status: DC
  Administered 2012-03-09 – 2012-03-12 (×7): 22 [IU] via SUBCUTANEOUS

## 2012-03-09 NOTE — Progress Notes (Signed)
Nutrition Brief Note  Chart reviewed. Patient s/p CABG 9/16.  Body mass index is 41.06 kg/(m^2). Pt meets criteria for Obesity Class III based on current BMI.   Current diet order is Carbohydrate Modified Medium Calorie. Per RN, patient is consuming100% of meals at this time. Labs and medications reviewed.   No nutrition interventions warranted at this time. If nutrition issues arise, please consult RD.   Kirkland Hun, RD, LDN Pager #: [phone] After-Hours Pager #: [phone]

## 2012-03-09 NOTE — Progress Notes (Addendum)
[facility] DAILY PROGRESS NOTE                   [address]            [place] [address]          [phone]      4 Days Post-Op Procedure(s) (LRB): CORONARY ARTERY BYPASS GRAFTING (CABG) (N/A)  Total Length of Stay:  LOS: 11 days   Subjective: Patient is hard of hearing, states "has belly discomfort". No bowel movement.  Objective: Vital signs in last 24 hours: Temp:  [98.1 F (36.7 C)-99.3 F (37.4 C)] 99.1 F (37.3 C) (09/20 0714) Pulse Rate:  [79-95] 95  (09/20 0800) Cardiac Rhythm:  [-] Normal sinus rhythm (09/20 0800) Resp:  [16-26] 19  (09/20 0800) BP: (103-166)/(45-82) 142/65 mmHg (09/20 0800) SpO2:  [96 %-99 %] 96 % (09/20 0800) Weight:  [286 lb 2.5 oz (129.8 kg)] 286 lb 2.5 oz (129.8 kg) (09/20 0400)  Filed Weights   03/07/12 0500 03/08/12 0500 03/09/12 0400  Weight: 288 lb 2.3 oz (130.7 kg) 287 lb 0.6 oz (130.2 kg) 286 lb 2.5 oz (129.8 kg)   Weight change: -14.1 oz (-0.4 kg)    Intake/Output from previous day: 09/19 0701 - 09/20 0700 In: 2165.1 [P.O.:580; I.V.:1403.1; IV Piggyback:182] Out: 5395 [Urine:5325; Chest Tube:70]  Intake/Output this shift: Total I/O In: 59.4 [I.V.:59.4] Out: 375 [Urine:375]  Current Meds: Scheduled Meds:    . acetaminophen  1,000 mg Oral Q6H   Or  . acetaminophen (TYLENOL) oral liquid 160 mg/5 mL  975 mg Per Tube Q6H  . aspirin EC  325 mg Oral Daily   Or  . aspirin  324 mg Per Tube Daily  . atorvastatin  80 mg Oral q1800  . bisacodyl  10 mg Oral Daily   Or  . bisacodyl  10 mg Rectal Daily  . brimonidine  1 drop Left Eye BID  . docusate sodium  200 mg Oral Daily  . dorzolamide-timolol  1 drop Left Eye BID  . erythromycin  1 application Right Eye BID  . ezetimibe  10 mg Oral Daily  . ferrous sulfate  325 mg Oral Q breakfast  . furosemide  160 mg Intravenous Q8H  . insulin aspart  0-24 Units Subcutaneous Q4H  . insulin glargine  15 Units Subcutaneous BID  . ipratropium  1 spray Nasal BID  .  levalbuterol  0.63 mg Nebulization TID  . loteprednol  1 drop Right Eye BID  . magnesium sulfate  4 g Intravenous Once  . metoprolol tartrate  12.5 mg Oral BID   Or  . metoprolol tartrate  12.5 mg Per Tube BID  . pantoprazole  40 mg Oral Q1200  . polyvinyl alcohol  1 drop Both Eyes BID  . potassium chloride  20 mEq Oral BID  . sodium chloride  3 mL Intravenous Q12H   Continuous Infusions:    . sodium chloride 20 mL/hr at 03/05/12 1430  . sodium chloride 40 mL/hr at 03/09/12 0400  . sodium chloride    . amiodarone (NEXTERONE PREMIX) 360 mg/200 mL dextrose 0.5 mg/min (03/09/12 0400)  . DOPamine 2 mcg/kg/min (03/09/12 0800)  . lactated ringers    . nitroGLYCERIN Stopped (03/05/12 1400)  . phenylephrine (NEO-SYNEPHRINE) Adult infusion Stopped (03/06/12 1100)   PRN Meds:.albuterol, metoprolol, midazolam, morphine injection, ondansetron (ZOFRAN) IV, oxyCODONE, sodium chloride  General appearance: alert and cooperative Neurologic: intact Heart: regular rate and rhythm Lungs: diminished breath sounds bilateral bases  and wheezes bilaterally Abdomen: Soft, obese, non tender, occasional bowel sounds Extremities: edema bilaterally Wounds: Clean and dry   Lab Results: CBC:  Basename 03/09/12 0520 03/08/12 0400  WBC 11.1* 11.5*  HGB 9.2* 8.4*  HCT 26.7* 24.5*  PLT 134* 108*   BMET:   Basename 03/09/12 0520 03/08/12 0400  NA 130* 136  K 4.0 3.7  CL 94* 101  CO2 24 23  GLUCOSE 154* 118*  BUN 48* 43*  CREATININE 2.35* 2.46*  CALCIUM 8.1* 7.5*    PT/INR: No results found for this basename: LABPROT,INR in the last 72 hours Radiology: Ct Abdomen Pelvis Wo Contrast  03/07/2012  *RADIOLOGY REPORT*  Clinical Data: Left-sided abdominal/flank pain.  Recent cardiac catheterization.  Dropping hemoglobin.  CT ABDOMEN AND PELVIS WITHOUT CONTRAST  Technique:  Multidetector CT imaging of the abdomen and pelvis was performed following the standard protocol without intravenous contrast.   Comparison: None.  Findings: The lung bases demonstrate small effusions and bibasilar atelectasis.  A left-sided chest tube is in place.  There is a large retroperitoneal hematoma on the left this extends from just below the spleen all way down into the extraperitoneal pelvis.  Moderate-sized hematoma also involves the left psoas muscle.  The solid abdominal organs are intact. Bilateral adrenal gland adenomas are noted.  No mesenteric or retroperitoneal mass or adenopathy.  The aorta demonstrates moderate atherosclerotic calcifications.  No focal aneurysm.  There is a Foley catheter in the bladder.  Prostate gland and seminal vesicles are unremarkable.  No inguinal mass or hernia. There is a small right groin hematoma likely from a recent cardiac catheterization.  The bony structures are intact.  Remote healed pubic rami fractures are noted on the right side.  IMPRESSION: Large retroperitoneal abdominal and pelvic hematoma and left psoas hematoma.   Original Report Authenticated By: P. Loralie Champagne, M.D.    Dg Chest Port 1 View  03/09/2012  *RADIOLOGY REPORT*  Clinical Data: Postop CABG.  PORTABLE CHEST - 1 VIEW  Comparison: Portable chest x-rays yesterday and dating back to 03/05/2012.  Findings: Sternotomy for CABG.  Cardiac silhouette enlarged but stable.  Improved pulmonary venous hypertension, with normal pulmonary vascularity currently.  Improved aeration in the right lung base, with minimal atelectasis persisting.  Stable dense consolidation in the left lower lobe.  Stable bilateral pleural effusions, left greater than right.  No new pulmonary parenchymal abnormalities.  Right jugular introducer sheath tip remains in the lower IJ.  IMPRESSION: Stable dense left lower lobe atelectasis and/or pneumonia and moderate-sized left pleural effusion.  Improved aeration in the right lung base, with mild atelectasis persisting.  No new abnormalities.   Original Report Authenticated By: Arnell Sieving, M.D.     Dg Chest Port 1 View  03/08/2012  *RADIOLOGY REPORT*  Clinical Data: Postop CABG  PORTABLE CHEST - 1 VIEW  Comparison: 03/07/2012; 03/06/2012; 03/05/2012  Findings: Grossly unchanged cardiac silhouette and mediastinal contours to be decreased lung volumes of patient rotation.  Post median sternotomy and CABG.  Stable positioning of support apparatus.  No definite pneumothorax.  Lung volumes remain persistently reduced with perihilar and bibasilar opacities.  Query small bilateral effusions, though evaluation is limited secondary to exclusion of the bilateral costophrenic angles.  Unchanged bones.  IMPRESSION: 1.  Stable positioning of support apparatus.  No pneumothorax. 2.  Persistently reduced lung volumes with perihilar and bibasilar opacities, possibly atelectasis.   Original Report Authenticated By: Waynard Reeds, M.D.      Assessment/Plan: S/P Procedure(s) (  LRB): CORONARY ARTERY BYPASS GRAFTING (CABG) (N/A) 1.CV-PAF. SR this am.On Amiodarone gttp and Dopamine. Wean Dopamine and likely change Amiodarone to oral. Will increase Lopressor to 25 bid.  2. Pulmonary-Chest tube with 295 cc of output. Continue to suction for now.CXR this am shows patient rotated to the right,low lung volumes, left base atelectasis, no ptx, and left base consolidation (probable effusion/atelectasis). Encourage incentive spirometer and flutter valve. Xopenex TID. 3.DM-CBGs 251/164/150.Will increase Insulin for better glucose control.  4.ABL anemia- H and H decreased to 9.2 and 26.7 this am.Continue Ferrous sulfate  5.Thrombocytopenia-Platelets increased to 134,000  6.CKD-Creatinine 2.35 this am (baseline around 1.7).Per nephrology.Foley to remain. 7.Mild hyponatremia-Sodium 130 8.Volume overload-continue with Lasix IV 9.Lactulose for constipation  Doree Fudge PA-C 03/09/2012 8:14 AM    I have seen and examined [name] and agree with the above assessment  and plan.  [name] MD Beeper  [phone] Office [phone] 03/09/2012 11:32 AM

## 2012-03-09 NOTE — Progress Notes (Signed)
[facility] Progress Note    Subjective:   Improved diuresis yesterday with continued IV lasix.  Patient still complaining of LLE weakness. Has chest pain with cough and intermittent shortness of breath.  Looks better, is hungry today   Objective:   BP 142/65  Pulse 95  Temp 99.1 F (37.3 C) (Oral)  Resp 19  Ht 5\' 10"  (1.778 m)  Wt 286 lb 2.5 oz (129.8 kg)  BMI 41.06 kg/m2  SpO2 96%  Intake/Output Summary (Last 24 hours) at 03/09/12 1610 Last data filed at 03/09/12 0800  Gross per 24 hour  Intake   2098 ml  Output   5500 ml  Net  -3402 ml   Weight change: -14.1 oz (-0.4 kg)  Physical Exam: General: VSS, Elderly WM, obese body habitus,numerous acces points and bandages throughout  Head: Normocephalic, atraumatic.  Eyes: Right eye blindness with enucleation,  Nose: Mucous membranes moist, not inflammed, nonerythematous.  Throat: Oropharynx nonerythematous, no exudate appreciated.  Neck: Right central cath in IJ; very short, thick neck  Lungs: Patient on 2L n/c, Poor respiratory effort, decreased breath sounds in lower lobes bilaterally with expiratory wheezes in upper lobes bilaterally, no rales or rhonchi  Chest Central midline incision with bandage covering Heart: RRR. Distant heart sounds  Abdomen: Protuberant, distended, non-tender, hypoactive bowel sounds.  Extremities: 2+ pitting edema of hands, 1+ pitting edema of feet, no pretibial edema.RUE with AVF without palpable thrill; lower extremities with medial thigh incisions bilaterally  GU: Extensive ecchymosis and hematoma of right inguinal area with scrotal edema of ecchymosis  Neurologic: Decreased hearing so could not assess orientation; strength testing limited by numerous lines and bandages  Skin: No visible rashes, scars.    Imaging: Ct Abdomen Pelvis Wo Contrast  03/07/2012  *RADIOLOGY REPORT*  Clinical Data: Left-sided abdominal/flank pain.  Recent cardiac catheterization.  Dropping  hemoglobin.  CT ABDOMEN AND PELVIS WITHOUT CONTRAST  Technique:  Multidetector CT imaging of the abdomen and pelvis was performed following the standard protocol without intravenous contrast.  Comparison: None.  Findings: The lung bases demonstrate small effusions and bibasilar atelectasis.  A left-sided chest tube is in place.  There is a large retroperitoneal hematoma on the left this extends from just below the spleen all way down into the extraperitoneal pelvis.  Moderate-sized hematoma also involves the left psoas muscle.  The solid abdominal organs are intact. Bilateral adrenal gland adenomas are noted.  No mesenteric or retroperitoneal mass or adenopathy.  The aorta demonstrates moderate atherosclerotic calcifications.  No focal aneurysm.  There is a Foley catheter in the bladder.  Prostate gland and seminal vesicles are unremarkable.  No inguinal mass or hernia. There is a small right groin hematoma likely from a recent cardiac catheterization.  The bony structures are intact.  Remote healed pubic rami fractures are noted on the right side.  IMPRESSION: Large retroperitoneal abdominal and pelvic hematoma and left psoas hematoma.   Original Report Authenticated By: P. Loralie Champagne, M.D.    Dg Chest Port 1 View  03/09/2012  *RADIOLOGY REPORT*  Clinical Data: Postop CABG.  PORTABLE CHEST - 1 VIEW  Comparison: Portable chest x-rays yesterday and dating back to 03/05/2012.  Findings: Sternotomy for CABG.  Cardiac silhouette enlarged but stable.  Improved pulmonary venous hypertension, with normal pulmonary vascularity currently.  Improved aeration in the right lung base, with minimal atelectasis persisting.  Stable dense consolidation in the left lower lobe.  Stable bilateral pleural effusions, left greater than right.  No new pulmonary  parenchymal abnormalities.  Right jugular introducer sheath tip remains in the lower IJ.  IMPRESSION: Stable dense left lower lobe atelectasis and/or pneumonia and  moderate-sized left pleural effusion.  Improved aeration in the right lung base, with mild atelectasis persisting.  No new abnormalities.   Original Report Authenticated By: Arnell Sieving, M.D.    Dg Chest Port 1 View  03/08/2012  *RADIOLOGY REPORT*  Clinical Data: Postop CABG  PORTABLE CHEST - 1 VIEW  Comparison: 03/07/2012; 03/06/2012; 03/05/2012  Findings: Grossly unchanged cardiac silhouette and mediastinal contours to be decreased lung volumes of patient rotation.  Post median sternotomy and CABG.  Stable positioning of support apparatus.  No definite pneumothorax.  Lung volumes remain persistently reduced with perihilar and bibasilar opacities.  Query small bilateral effusions, though evaluation is limited secondary to exclusion of the bilateral costophrenic angles.  Unchanged bones.  IMPRESSION: 1.  Stable positioning of support apparatus.  No pneumothorax. 2.  Persistently reduced lung volumes with perihilar and bibasilar opacities, possibly atelectasis.   Original Report Authenticated By: Waynard Reeds, M.D.     Labs: BMET  Lab 03/09/12 0520 03/08/12 0400 03/07/12 0400 03/06/12 1656 03/06/12 1651 03/06/12 0410 03/05/12 2000 03/05/12 1958 03/05/12 0947 03/04/12 0534  NA 130* 136 133* 131* 137 136 -- 140 -- --  K 4.0 3.7 4.3 4.3 4.6 4.7 -- 4.8 -- --  CL 94* 101 101 99 102 105 -- 106 -- --  CO2 24 23 22 22  -- 23 -- -- 19 23  GLUCOSE 154* 118* 176* 200* 192* 146* -- 63* -- --  BUN 48* 43* 38* 36* 34* 34* -- 31* -- --  CREATININE 2.35* 2.46* 2.60* 2.51* 2.70* 2.29* 2.16* -- -- --  ALB -- -- -- -- -- -- -- -- -- --  CALCIUM 8.1* 7.5* 7.4* 7.4* -- 7.4* -- -- 7.6* 9.2  PHOS 3.7 -- -- -- -- -- -- -- -- --   CBC  Lab 03/09/12 0520 03/08/12 0400 03/07/12 0400 03/06/12 2125 03/06/12 1213  WBC 11.1* 11.5* 13.4* -- 13.8*  NEUTROABS -- -- -- -- --  HGB 9.2* 8.4* 8.6* 9.0* --  HCT 26.7* 24.5* 25.6* 26.3* --  MCV 87.0 86.6 86.2 -- 85.2  PLT 134* 108* 99* -- 86*    Medications:        . acetaminophen  1,000 mg Oral Q6H   Or  . acetaminophen (TYLENOL) oral liquid 160 mg/5 mL  975 mg Per Tube Q6H  . aspirin EC  325 mg Oral Daily   Or  . aspirin  324 mg Per Tube Daily  . atorvastatin  80 mg Oral q1800  . bisacodyl  10 mg Oral Daily   Or  . bisacodyl  10 mg Rectal Daily  . brimonidine  1 drop Left Eye BID  . docusate sodium  200 mg Oral Daily  . dorzolamide-timolol  1 drop Left Eye BID  . erythromycin  1 application Right Eye BID  . ezetimibe  10 mg Oral Daily  . ferrous sulfate  325 mg Oral Q breakfast  . furosemide  160 mg Intravenous Q8H  . insulin aspart  0-24 Units Subcutaneous Q4H  . insulin glargine  15 Units Subcutaneous BID  . ipratropium  1 spray Nasal BID  . levalbuterol  0.63 mg Nebulization TID  . loteprednol  1 drop Right Eye BID  . magnesium sulfate  4 g Intravenous Once  . metoprolol tartrate  12.5 mg Oral BID   Or  .  metoprolol tartrate  12.5 mg Per Tube BID  . pantoprazole  40 mg Oral Q1200  . polyvinyl alcohol  1 drop Both Eyes BID  . potassium chloride  20 mEq Oral BID  . sodium chloride  3 mL Intravenous Q12H     Assessment/ Plan:   Pt is a 71 y.o. yo male with baseline CKD stage III, COPD, DM2, and CAD s/p CABG on 03/05/12, who is experiencing post-operative acute on chronic  kidney injury.   Acute Kidney Injury - The patient has well documented CKD - diabetic nephropathy with a baseline creatinine of 1.7 prior to admission. He is a patient of Dr. Eliott Nine at [facility], who prescribed the patient ramipril for treatment of his proteinuria. It appears that this medication was stopped prior to his cardiac catheterization, and his acute kidney injury did not occur until after a week after the catheterization, making contrast nephropathy very unlikely. The patient had a significant retroperitoneal bleed overnight prior to surgery, which seemed to set off the bump in creatinine. This indicates that the blood loss likely caused  acute tubular necrosis, evidenced by a BUN/Cr < 15.  - Patient with improved diuresis on Lasix 160 mg TID - out 3.2L in 24 hours and improving creatinine to 2.35, but with rising slowly rising BUN to 48 - Consider decreasing Lasix to 80 mg q 8 hr as patient now hyponatremia hypochloremia- agree.  Also will stop 1/2 normal saline being given - Normokalemia s/p repletion 9/20  [facility] - calcium and phos normal, so no need for supplementation right now .  Will check mag in AM since gave dosing yesterday  Normocytic Anemia - Likely secondary to acute blood loss from retroperitoneal bleed and blood loss from surgery. It is unlikely that this is related to kidney function, as his hgb was approximately 12 on the day of admission.  - Hgb improved to 9.2 and will likely continue to trend up - PO FeSO4 per primary team   Cardiology - Patient s/p 4 vessel CABG on 9/16. A-fib persistent.  - currently on dopamine and amiodarone drips primary team   DVT PPX - SCD's in place. Management for primary team.     Si Raider. Clinton Sawyer, MD   03/09/2012, 8:07 AM   Patient seen and examined, agree with above note with above modifications. 71 year old WM with acute on chronic renal failure in the setting of cath and CABG.  Fortunately stable overnight with great UOP with lasix.  Continue lasix but decrease and continue to follow, no indications for HD Annie Sable, MD 03/09/2012      03/09/2012

## 2012-03-09 NOTE — Progress Notes (Signed)
SUBJECTIVE:  Doing well no complaints  OBJECTIVE:   Vitals:   Filed Vitals:   03/09/12 0714 03/09/12 0800 03/09/12 0900 03/09/12 1000  BP:  142/65 127/65 115/57  Pulse:  95 98 94  Temp: 99.1 F (37.3 C)     TempSrc: Oral     Resp:  19 21 16   Height:      Weight:      SpO2:  96% 97% 99%   I&O's:   Intake/Output Summary (Last 24 hours) at 03/09/12 1047 Last data filed at 03/09/12 1000  Gross per 24 hour  Intake 1991.2 ml  Output   5450 ml  Net -3458.8 ml   TELEMETRY: Reviewed telemetry pt in NSR     PHYSICAL EXAM General: Well developed, well nourished, in no acute distress Head: Eyes PERRLA, No xanthomas.   Normal cephalic and atramatic  Lungs:   Clear bilaterally to auscultation and percussion. Heart:   HRRR S1 S2 Pulses are 2+ & equal. Abdomen: Bowel sounds are positive, abdomen soft and non-tender without massesfor age. Extremities:   No clubbing, cyanosis or edema.  DP +1 Neuro: Alert and oriented X 3. Psych:  Good affect, responds appropriately   LABS: Basic Metabolic Panel:  Basename 03/09/12 0520 03/08/12 0400 03/06/12 1656  NA 130* 136 --  K 4.0 3.7 --  CL 94* 101 --  CO2 24 23 --  GLUCOSE 154* 118* --  BUN 48* 43* --  CREATININE 2.35* 2.46* --  CALCIUM 8.1* 7.5* --  MG -- -- 1.7  PHOS 3.7 -- --   Liver Function Tests:  Basename 03/09/12 0520 03/08/12 0400  AST -- 59*  ALT -- 39  ALKPHOS -- 52  BILITOT -- 0.5  PROT -- 4.9*  ALBUMIN 2.3* 2.2*   No results found for this basename: LIPASE:2,AMYLASE:2 in the last 72 hours CBC:  Basename 03/09/12 0520 03/08/12 0400  WBC 11.1* 11.5*  NEUTROABS -- --  HGB 9.2* 8.4*  HCT 26.7* 24.5*  MCV 87.0 86.6  PLT 134* 108*   Coag Panel:   Lab Results  Component Value Date   INR 1.47 03/05/2012   INR 1.01 02/27/2012    RADIOLOGY: Ct Abdomen Pelvis Wo Contrast  03/07/2012  *RADIOLOGY REPORT*  Clinical Data: Left-sided abdominal/flank pain.  Recent cardiac catheterization.  Dropping hemoglobin.  CT  ABDOMEN AND PELVIS WITHOUT CONTRAST  Technique:  Multidetector CT imaging of the abdomen and pelvis was performed following the standard protocol without intravenous contrast.  Comparison: None.  Findings: The lung bases demonstrate small effusions and bibasilar atelectasis.  A left-sided chest tube is in place.  There is a large retroperitoneal hematoma on the left this extends from just below the spleen all way down into the extraperitoneal pelvis.  Moderate-sized hematoma also involves the left psoas muscle.  The solid abdominal organs are intact. Bilateral adrenal gland adenomas are noted.  No mesenteric or retroperitoneal mass or adenopathy.  The aorta demonstrates moderate atherosclerotic calcifications.  No focal aneurysm.  There is a Foley catheter in the bladder.  Prostate gland and seminal vesicles are unremarkable.  No inguinal mass or hernia. There is a small right groin hematoma likely from a recent cardiac catheterization.  The bony structures are intact.  Remote healed pubic rami fractures are noted on the right side.  IMPRESSION: Large retroperitoneal abdominal and pelvic hematoma and left psoas hematoma.   Original Report Authenticated By: P. Loralie Champagne, M.D.    Dg Chest 2 View  02/27/2012  *RADIOLOGY REPORT*  Clinical Data: Preop  CHEST - 2 VIEW  Comparison: 08/21/2007  Findings: Normal heart size.  Lungs are under aerated with bibasilar atelectasis.  No pneumothorax and no pleural effusion.  IMPRESSION: Bibasilar atelectasis.   Original Report Authenticated By: Donavan Burnet, M.D.    Dg Chest Port 1 View  03/09/2012  *RADIOLOGY REPORT*  Clinical Data: Postop CABG.  PORTABLE CHEST - 1 VIEW  Comparison: Portable chest x-rays yesterday and dating back to 03/05/2012.  Findings: Sternotomy for CABG.  Cardiac silhouette enlarged but stable.  Improved pulmonary venous hypertension, with normal pulmonary vascularity currently.  Improved aeration in the right lung base, with minimal atelectasis  persisting.  Stable dense consolidation in the left lower lobe.  Stable bilateral pleural effusions, left greater than right.  No new pulmonary parenchymal abnormalities.  Right jugular introducer sheath tip remains in the lower IJ.  IMPRESSION: Stable dense left lower lobe atelectasis and/or pneumonia and moderate-sized left pleural effusion.  Improved aeration in the right lung base, with mild atelectasis persisting.  No new abnormalities.   Original Report Authenticated By: Arnell Sieving, M.D.    Dg Chest Port 1 View  03/08/2012  *RADIOLOGY REPORT*  Clinical Data: Postop CABG  PORTABLE CHEST - 1 VIEW  Comparison: 03/07/2012; 03/06/2012; 03/05/2012  Findings: Grossly unchanged cardiac silhouette and mediastinal contours to be decreased lung volumes of patient rotation.  Post median sternotomy and CABG.  Stable positioning of support apparatus.  No definite pneumothorax.  Lung volumes remain persistently reduced with perihilar and bibasilar opacities.  Query small bilateral effusions, though evaluation is limited secondary to exclusion of the bilateral costophrenic angles.  Unchanged bones.  IMPRESSION: 1.  Stable positioning of support apparatus.  No pneumothorax. 2.  Persistently reduced lung volumes with perihilar and bibasilar opacities, possibly atelectasis.   Original Report Authenticated By: Waynard Reeds, M.D.    Dg Chest Port 1 View  03/07/2012  *RADIOLOGY REPORT*  Clinical Data: Postop CABG 03/05/2012.  PORTABLE CHEST - 1 VIEW  Comparison: 03/06/2012.  Findings: 0550 hours.  Interval extubation with removal of the nasogastric tube and right IJ Swan-Ganz catheter.  A right IJ sheath and left chest tube remain in place.  There are persistent low lung volumes with bibasilar atelectasis and a possible small left pleural effusion.  No pneumothorax is evident.  Heart size and mediastinal contours are stable allowing for rotation and the low lung volumes.  Glenohumeral degenerative changes are  noted.  IMPRESSION: No significant change in bibasilar atelectasis following extubation.  No pneumothorax.   Original Report Authenticated By: Gerrianne Scale, M.D.    Dg Chest Portable 1 View In Am  03/06/2012  *RADIOLOGY REPORT*  Clinical Data: Bypass surgery.  PORTABLE CHEST - 1 VIEW  Comparison: 03/05/2012.  Findings: The endotracheal tube, NG tube and right IJ Swan-Ganz catheters are stable.  The left-sided chest tube is unchanged.  No pneumothorax.  Persistent low lung volumes with vascular crowding and bibasilar atelectasis.  A small left pleural effusion persists.  IMPRESSION:  1.  Stable support apparatus. 2.  Low lung volumes with vascular crowding, basilar atelectasis and small left effusion.   Original Report Authenticated By: P. Loralie Champagne, M.D.    Dg Chest Portable 1 View  03/05/2012  *RADIOLOGY REPORT*  Clinical Data: Bypass surgery.  PORTABLE CHEST - 1 VIEW  Comparison: 03/08/2012.  Findings: The endotracheal tube is in good position, 5 cm above the carina.  The right IJ Swan-Ganz catheter tip was in the proximal  right pulmonary artery.  The NG tube is in the stomach.  There is a left-sided chest tube in good position.  No pneumothorax.  Low lung volumes with vascular crowding and atelectasis.  No edema, effusions or  pneumothorax.  IMPRESSION:  1.  Support apparatus in good position without complicating features. 2.  Low lung volumes with vascular crowding and atelectasis.   Original Report Authenticated By: P. Loralie Champagne, M.D.       ASSESSMENT:  1. CAD s/p CABG  2. Post-op afib in NSR on IV Amio  3. Acute on chronic renal insuff (dye exposure/recent CABG) 4. HTN 5. DCM 6.  COPD 7.  Volume overload  PLAN:   1.  Change IV Amiodarone to PO 2.  Continue diuresis 3.  Continue Lopressor  Quintella Reichert, MD  03/09/2012  10:47 AM

## 2012-03-09 NOTE — Progress Notes (Signed)
PT Note   03/09/12 1413  PT Visit Information  Last PT Received On 03/09/12  Assistance Needed +3 or more  PT Time Calculation  PT Start Time 1327  PT Stop Time 1339  PT Time Calculation (min) 12 min  Precautions  Precautions Sternal;Fall  Cognition  Overall Cognitive Status Appears within functional limits for tasks assessed/performed  Difficult to assess due to Hard of hearing/deaf  Arousal/Alertness Awake/alert  Behavior During Session [facility] for tasks performed  Bed Mobility  Sit to Supine 1: +2 Total assist;HOB flat  Sit to Supine: Patient Percentage 20%  Transfers  Transfers Stand Pivot Transfers  Sit to Stand 1: +2 Total assist;From chair/3-in-1  Sit to Stand: Patient Percentage 60%  Stand to Sit 1: +2 Total assist;To bed  Stand to Sit: Patient Percentage 80%  Stand Pivot Transfers Other (comment) (3 people needed to manage lines, Huntley Dec Plus, and for LLE)  Stand Pivot Transfers: Patient Percentage 60%  Transfer via Secondary school teacher Standing - Balance Support Bilateral upper extremity supported  Static Standing - Level of Assistance 1: +2 Total assist  Static Standing - Comment/# of Minutes with Huntley Dec Plus x 1 minute  PT - End of Session  Equipment Utilized During Treatment Oxygen Huntley Dec Plus)  Activity Tolerance Patient tolerated treatment well  Patient left in bed;with nursing in room  PT - Assessment/Plan  Comments on Treatment Session Pt s/p CABG.  Lt leg is buckling or pulling up off of floor into flexion with standing.  Able to stand but unable to take any steps due to lt leg buckling.  PT Plan Discharge plan remains appropriate;Frequency remains appropriate  PT Frequency Min 3X/week  Follow Up Recommendations Skilled nursing facility  Equipment Recommended Other (comment)  Acute Rehab PT Goals  PT Goal: Sit to Supine/Side - Progress Progressing toward goal  PT Goal: Sit to Stand - Progress Progressing toward goal  PT  Goal: Stand to Sit - Progress Progressing toward goal  PT Transfer Goal: Bed to Chair/Chair to Bed - Progress Progressing toward goal    [facility] PT [phone]

## 2012-03-09 NOTE — Progress Notes (Signed)
Physical Therapy Treatment Patient Details Name: [name] MRN: [id number] DOB: 13-Jan-1941 Today's Date: 03/09/2012 Time: 4098-1191 PT Time Calculation (min): 38 min  PT Assessment / Plan / Recommendation Comments on Treatment Session  Pt s/p CABG.  Lt leg is buckling or pulling up off of floor into flexion with standing.  Able to stand but unable to take any steps due to lt leg buckling.    Follow Up Recommendations  Skilled nursing facility    Barriers to Discharge        Equipment Recommendations  Other (comment) (to be determined.)    Recommendations for Other Services    Frequency Min 3X/week   Plan Discharge plan remains appropriate;Frequency remains appropriate    Precautions / Restrictions Precautions Precautions: Sternal;Fall   Pertinent Vitals/Pain VSS    Mobility  Bed Mobility Supine to Sit: 1: +2 Total assist Supine to Sit: Patient Percentage: 40% Transfers Transfers: Stand Pivot Transfers Sit to Stand: 1: +2 Total assist;From bed Sit to Stand: Patient Percentage: 70% Stand to Sit: 1: +2 Total assist;To bed;To chair/3-in-1 Stand to Sit: Patient Percentage: 80% Stand Pivot Transfers: Other (comment) (+3 total A and used Sara Plus due to buckling of lt leg) Stand Pivot Transfers: Patient Percentage: 60% Transfer via Lift Equipment: Hydrographic surveyor Details for Transfer Assistance: Pt stood x 3 with RW and x1 with [employer].  Pt's lt leg buckles when significant wt. is placed on it.  Lt leg also begins flexing up in standing with foot leaving the ground.  Needed 3  people with stand pivot transfer due to lines and 1 person needed to  hold lt leg on foot plate.    Exercises     PT Diagnosis:    PT Problem List:   PT Treatment Interventions:     PT Goals Acute Rehab PT Goals PT Goal: Supine/Side to Sit - Progress: Progressing toward goal PT Goal: Sit to Stand - Progress: Progressing toward goal PT Goal: Stand to Sit - Progress: Progressing toward goal PT  Transfer Goal: Bed to Chair/Chair to Bed - Progress: Progressing toward goal  Visit Information  Last PT Received On: 03/09/12 Assistance Needed: +3 or more    Subjective Data  Subjective: Pt stated "I'm trying," when we were working on standing.  Reassured pt that we knew he was trying.   Cognition  Overall Cognitive Status: Appears within functional limits for tasks assessed/performed Difficult to assess due to: Hard of hearing/deaf Arousal/Alertness: Awake/alert Behavior During Session: [facility] for tasks performed    Balance  Static Sitting Balance Static Sitting - Balance Support: Bilateral upper extremity supported Static Sitting - Level of Assistance: 5: Stand by assistance Static Sitting - Comment/# of Minutes: 20 minutes Static Standing Balance Static Standing - Balance Support: Bilateral upper extremity supported (on walker or Sara Plus) Static Standing - Level of Assistance: 1: +2 Total assist Static Standing - Comment/# of Minutes: Stood x 3 for ~ 1 minute each.  Once with RW and twice with Newman Nip.  End of Session PT - End of Session Equipment Utilized During Treatment: Oxygen;Gait belt Activity Tolerance: Patient tolerated treatment well Patient left: in chair;with call bell/phone within reach;with nursing in room Nurse Communication: Mobility status;Need for lift equipment   GP     Tonita Bills 03/09/2012, 11:55 AM  [facility] PT [phone]

## 2012-03-09 NOTE — Progress Notes (Signed)
TCTS BRIEF SICU PROGRESS NOTE  4 Days Post-Op  S/P Procedure(s) (LRB): CORONARY ARTERY BYPASS GRAFTING (CABG) (N/A)   Stable day Still in Afib w/ controlled rate Ambulated some CBG's increased  Plan: Increase lantus insulin dose  Daeja Helderman H 03/09/2012 8:02 PM

## 2012-03-10 ENCOUNTER — Inpatient Hospital Stay (HOSPITAL_COMMUNITY): Payer: Medicare Other

## 2012-03-10 LAB — CBC
HCT: 27.1 % — ABNORMAL LOW (ref 39.0–52.0)
Hemoglobin: 8.9 g/dL — ABNORMAL LOW (ref 13.0–17.0)
MCH: 28.8 pg (ref 26.0–34.0)
MCHC: 32.8 g/dL (ref 30.0–36.0)
MCV: 87.7 fL (ref 78.0–100.0)
Platelets: 146 10*3/uL — ABNORMAL LOW (ref 150–400)
RBC: 3.09 MIL/uL — ABNORMAL LOW (ref 4.22–5.81)
RDW: 16.9 % — ABNORMAL HIGH (ref 11.5–15.5)
WBC: 11.2 10*3/uL — ABNORMAL HIGH (ref 4.0–10.5)

## 2012-03-10 LAB — GLUCOSE, CAPILLARY: Glucose-Capillary: 128 mg/dL — ABNORMAL HIGH (ref 70–99)

## 2012-03-10 LAB — BASIC METABOLIC PANEL
BUN: 55 mg/dL — ABNORMAL HIGH (ref 6–23)
CO2: 25 mEq/L (ref 19–32)
Chloride: 98 mEq/L (ref 96–112)
Creatinine, Ser: 2.29 mg/dL — ABNORMAL HIGH (ref 0.50–1.35)

## 2012-03-10 MED ORDER — FUROSEMIDE 10 MG/ML IJ SOLN
80.0000 mg | Freq: Two times a day (BID) | INTRAMUSCULAR | Status: DC
  Administered 2012-03-10 – 2012-03-11 (×2): 80 mg via INTRAVENOUS
  Filled 2012-03-10 (×2): qty 8

## 2012-03-10 NOTE — Progress Notes (Signed)
Subjective:  No c/o of SOB feels reasonably well.  Nurse reports patient not able to stand with max assistance.  Objective:  Vital Signs in the last 24 hours: BP 141/68  Pulse 89  Temp 99 F (37.2 C) (Oral)  Resp 23  Ht 5\' 10"  (1.778 m)  Wt 127.9 kg (281 lb 15.5 oz)  BMI 40.46 kg/m2  SpO2 98%  Physical Exam: Obese Wm in NAD very hard of hearing Lungs:  Clear  Cardiac:  Regular rhythm, normal S1 and S2, no S3 Extremities:  2+ edema  Intake/Output from previous day: 09/20 0701 - 09/21 0700 In: 1288.3 [P.O.:960; I.V.:304.3; IV Piggyback:24] Out: 3190 [Urine:3190] Weight Filed Weights   03/08/12 0500 03/09/12 0400 03/10/12 0600  Weight: 130.2 kg (287 lb 0.6 oz) 129.8 kg (286 lb 2.5 oz) 127.9 kg (281 lb 15.5 oz)    Lab Results: Basic Metabolic Panel:  Basename 03/10/12 0640 03/09/12 0520  NA 136 130*  K 3.8 4.0  CL 98 94*  CO2 25 24  GLUCOSE 144* 154*  BUN 55* 48*  CREATININE 2.29* 2.35*    CBC:  Basename 03/10/12 0640 03/09/12 0520  WBC 11.2* 11.1*  NEUTROABS -- --  HGB 8.9* 9.2*  HCT 27.1* 26.7*  MCV 87.7 87.0  PLT 146* 134*   Telemetry:  Sinus rhythm today  Assessment/Plan:  1. CAD s/p CABG  2. Post-op afib in NSR on po amiodarone 3. Acute on chronic renal insuff  Better 4. Volume overload 5. Retroperitoneal hematoma with weakness  Rec:  Continue po amiodarone.  He is really not able to stand well at this time.  Still significantly volume overloaded.      Darden Palmer  MD [facility] Cardiology  03/10/2012, 11:26 AM

## 2012-03-10 NOTE — Progress Notes (Signed)
[facility] Progress Note    Subjective:   Patient states he is "doing all right" today. He states he has pain when he moves, but otherwise no complaints. His Lasix dose was decreased yesterday, and he continues to have good UOP with 3190cc/24 hours (Net of 1.9L out). All of his IV medications were d/c yesterday as well and he is taking good PO.  He continues to look better every day   Objective:   BP 143/72  Pulse 92  Temp 99 F (37.2 C) (Oral)  Resp 27  Ht 5\' 10"  (1.778 m)  Wt 281 lb 15.5 oz (127.9 kg)  BMI 40.46 kg/m2  SpO2 99%  Intake/Output Summary (Last 24 hours) at 03/10/12 0749 Last data filed at 03/10/12 0700  Gross per 24 hour  Intake 1288.3 ml  Output   3190 ml  Net -1901.7 ml   Weight change: -4 lb 3 oz (-1.9 kg)  Physical Exam: General: VSS, Elderly WM, obese body habitus. Sitting up in chair, NAD Head: Normocephalic, atraumatic. Hearing aid in left ear Eyes: Right eye blindness with enucleation Neck: Right central cath in IJ; very short, thick neck  Lungs: Poor respiratory effort, decreased breath sounds in lower lobes bilaterally but no wheezes appreciated Chest Central midline incision healing well Heart: Irregular rhythm. Distant heart sounds  Abdomen: Protuberant, distended, non-tender, +bowel sounds.  Extremities: 1+ pitting edema of hands, 1+ pitting edema of feet, trace pretibial edema of LLE. Lower extremities with medial thigh incisions bilaterally  Neurologic: Decreased hearing; good grip strength. Able to follow commands. Skin: No visible rashes  Imaging: Dg Chest Port 1 View  03/09/2012  *RADIOLOGY REPORT*  Clinical Data: Postop CABG.  PORTABLE CHEST - 1 VIEW  Comparison: Portable chest x-rays yesterday and dating back to 03/05/2012.  Findings: Sternotomy for CABG.  Cardiac silhouette enlarged but stable.  Improved pulmonary venous hypertension, with normal pulmonary vascularity currently.  Improved aeration in the right lung base,  with minimal atelectasis persisting.  Stable dense consolidation in the left lower lobe.  Stable bilateral pleural effusions, left greater than right.  No new pulmonary parenchymal abnormalities.  Right jugular introducer sheath tip remains in the lower IJ.  IMPRESSION: Stable dense left lower lobe atelectasis and/or pneumonia and moderate-sized left pleural effusion.  Improved aeration in the right lung base, with mild atelectasis persisting.  No new abnormalities.   Original Report Authenticated By: Arnell Sieving, M.D.     Labs: BMET  Lab 03/10/12 0640 03/09/12 0520 03/08/12 0400 03/07/12 0400 03/06/12 1656 03/06/12 1651 03/06/12 0410 03/05/12 0947  NA 136 130* 136 133* 131* 137 136 --  K 3.8 4.0 3.7 4.3 4.3 4.6 4.7 --  CL 98 94* 101 101 99 102 105 --  CO2 25 24 23 22 22  -- 23 19  GLUCOSE 144* 154* 118* 176* 200* 192* 146* --  BUN 55* 48* 43* 38* 36* 34* 34* --  CREATININE 2.29* 2.35* 2.46* 2.60* 2.51* 2.70* 2.29* --  ALB -- -- -- -- -- -- -- --  CALCIUM 8.5 8.1* 7.5* 7.4* 7.4* -- 7.4* 7.6*  PHOS -- 3.7 -- -- -- -- -- --   CBC  Lab 03/10/12 0640 03/09/12 0520 03/08/12 0400 03/07/12 0400  WBC 11.2* 11.1* 11.5* 13.4*  NEUTROABS -- -- -- --  HGB 8.9* 9.2* 8.4* 8.6*  HCT 27.1* 26.7* 24.5* 25.6*  MCV 87.7 87.0 86.6 86.2  PLT 146* 134* 108* 99*    Medications:       .  acetaminophen  1,000 mg Oral Q6H   Or  . acetaminophen (TYLENOL) oral liquid 160 mg/5 mL  975 mg Per Tube Q6H  . amiodarone  200 mg Oral Q12H   Followed by  . amiodarone  200 mg Oral Daily  . aspirin EC  325 mg Oral Daily   Or  . aspirin  324 mg Per Tube Daily  . atorvastatin  80 mg Oral q1800  . bisacodyl  10 mg Oral Daily   Or  . bisacodyl  10 mg Rectal Daily  . brimonidine  1 drop Left Eye BID  . docusate sodium  200 mg Oral Daily  . dorzolamide-timolol  1 drop Left Eye BID  . erythromycin  1 application Right Eye BID  . ezetimibe  10 mg Oral Daily  . ferrous sulfate  325 mg Oral Q breakfast  .  furosemide  80 mg Intravenous Q8H  . insulin aspart  0-24 Units Subcutaneous Q4H  . insulin glargine  22 Units Subcutaneous BID  . ipratropium  1 spray Nasal BID  . lactulose  20 g Oral Once  . levalbuterol  0.63 mg Nebulization TID  . loteprednol  1 drop Right Eye BID  . metoprolol tartrate  25 mg Oral BID   Or  . metoprolol tartrate  25 mg Per Tube BID  . pantoprazole  40 mg Oral Q1200  . polyvinyl alcohol  1 drop Both Eyes BID  . DISCONTD: furosemide  160 mg Intravenous Q8H  . DISCONTD: insulin glargine  15 Units Subcutaneous BID  . DISCONTD: insulin glargine  18 Units Subcutaneous BID  . DISCONTD: magnesium sulfate  4 g Intravenous Once  . DISCONTD: metoprolol tartrate  12.5 mg Per Tube BID  . DISCONTD: metoprolol tartrate  12.5 mg Oral BID  . DISCONTD: sodium chloride  3 mL Intravenous Q12H    Assessment/ Plan:   Pt is a 71 y.o. 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   Acute Kidney Injury - The patient has well documented CKD - diabetic nephropathy with a baseline creatinine of 1.7 prior to admission. He is a patient of Dr. Eliott Nine at [facility], who prescribed the patient ramipril for treatment of his proteinuria. It appears that this medication was stopped prior to his cardiac catheterization, and his acute kidney injury did not occur until after a week after the catheterization, making contrast nephropathy very unlikely. The patient had a significant retroperitoneal bleed overnight prior to surgery, which seemed to set off the bump in creatinine. This indicates that the blood loss likely caused acute tubular necrosis, evidenced by a BUN/Cr < 15.   - Patient with good diuresis. Currently on Lasix 80 IV q8. Electrolytes within normal limits. Will eventually to plan to transition to PO Lasix, but will continue IV for now while still fluid overloaded.  Had 2 liters out  yesterday which may be a little more than we want.  I will decrease lasix to q 12 and follow - Creatinine slightly improved today. BUN continues to rise, which is expected as patient's kidneys recover. - Daily labs - Continue to monitor strict I/O. Please continue foley cath for accurate I/O given patient inability to ambulate and deconditioning - Normokalemia at 3.8 s/p repletion 9/20.  Mag 1.9 - Encourage PO intake. Stop IVF, if possible - No indication for HD; encouraged by diuresis and improving renal function.  Bone Health -  Calcium and phos normal, so no need for supplementation right now.   Normocytic Anemia - Likely secondary to acute blood loss from retroperitoneal bleed and blood loss from surgery. It is unlikely that this is related to kidney function, as his Hgb was approximately 12 on the day of admission.  - Hgb 8.9 this morning - PO iron per primary team   Cardiology - Patient s/p 4 vessel CABG on 9/16.  - All drips d/c yesterday. Maintaining BP.  - Continue PO amiodarone, metoprolol per cards   DVT PPX - SCD's  Amber M. Hairford, M.D.  03/10/2012, 7:49 AM   Patient seen and examined, agree with above note with above modifications.  71 year old WM with acute on chronic renal failure in the setting of cath/Cabg and RPB.  He is nonoliguric and creatinine is trending down.  He is still much above his pre op weight so will continue lasix but back off slightly Annie Sable, MD 03/10/2012

## 2012-03-10 NOTE — Progress Notes (Addendum)
   CARDIOTHORACIC SURGERY PROGRESS NOTE   R5 Days Post-Op Procedure(s) (LRB): CORONARY ARTERY BYPASS GRAFTING (CABG) (N/A)  Subjective: Looks okay.  Denies pain.  Breathing comfortably.  Objective: Vital signs: BP Readings from Last 1 Encounters:  03/10/12 128/61   Pulse Readings from Last 1 Encounters:  03/10/12 91   Resp Readings from Last 1 Encounters:  03/10/12 19   Temp Readings from Last 1 Encounters:  03/10/12 99 F (37.2 C) Oral    Hemodynamics:    Physical Exam:  Rhythm:   Sinus w/ PAC's  Breath sounds: Fairly clear  Heart sounds:  RRR  Incisions:  Clean and dry  Abdomen:  Soft, non tender  Extremities:  Warm, swollen   Intake/Output from previous day: 09/20 0701 - 09/21 0700 In: 1288.3 [P.O.:960; I.V.:304.3; IV Piggyback:24] Out: 3190 [Urine:3190] Intake/Output this shift: Total I/O In: 240 [P.O.:240] Out: 175 [Urine:175]  Lab Results:  [facility] 03/10/12 0640 03/09/12 0520  WBC 11.2* 11.1*  HGB 8.9* 9.2*  HCT 27.1* 26.7*  PLT 146* 134*   BMET:  Basename 03/10/12 0640 03/09/12 0520  NA 136 130*  K 3.8 4.0  CL 98 94*  CO2 25 24  GLUCOSE 144* 154*  BUN 55* 48*  CREATININE 2.29* 2.35*  CALCIUM 8.5 8.1*    CBG (last 3)   Basename 03/10/12 0715 03/10/12 0449 03/09/12 2314  GLUCAP 142* 128* 233*   ABG    Component Value Date/Time   PHART 7.318* 03/06/2012 0732   HCO3 22.5 03/06/2012 0732   TCO2 20 03/06/2012 1651   ACIDBASEDEF 3.0* 03/06/2012 0732   O2SAT 95.0 03/06/2012 0732   CXR: Stable LLL opacity c/w atelectasis +/- small effusion  Assessment/Plan: S/P Procedure(s) (LRB): CORONARY ARTERY BYPASS GRAFTING (CABG) (N/A)  Overall stable Postop Afib, currently maintaining NSR on amiodarone Still very volume-overloaded, >25 lbs above baseline Acute on chronic renal insufficiency, creatinine slightly improved Expected post op acute blood loss anemia, mild, stable Type II diabetes mellitus, improved glycemic control Preop  retroperitoneal bleed   Mobilize  Diuretic management per Nephrology team  Continue amiodarone  Continue lantus + SSI  Keep foley    [name],[name] 03/10/2012 9:55 AM

## 2012-03-10 NOTE — Progress Notes (Signed)
TCTS BRIEF SICU PROGRESS NOTE  5 Days Post-Op  S/P Procedure(s) (LRB): CORONARY ARTERY BYPASS GRAFTING (CABG) (N/A)   Stable day  Plan: Continue current plan  [name],[name] 03/10/2012 5:30 PM

## 2012-03-11 LAB — GLUCOSE, CAPILLARY
Glucose-Capillary: 173 mg/dL — ABNORMAL HIGH (ref 70–99)
Glucose-Capillary: 225 mg/dL — ABNORMAL HIGH (ref 70–99)
Glucose-Capillary: 186 mg/dL — ABNORMAL HIGH (ref 70–99)
Glucose-Capillary: 218 mg/dL — ABNORMAL HIGH (ref 70–99)

## 2012-03-11 LAB — RENAL FUNCTION PANEL
Albumin: 2.1 g/dL — ABNORMAL LOW (ref 3.5–5.2)
BUN: 61 mg/dL — ABNORMAL HIGH (ref 6–23)
CO2: 24 mEq/L (ref 19–32)
Calcium: 8.6 mg/dL (ref 8.4–10.5)
Chloride: 96 mEq/L (ref 96–112)
Creatinine, Ser: 2.14 mg/dL — ABNORMAL HIGH (ref 0.50–1.35)
GFR calc Af Amer: 34 mL/min — ABNORMAL LOW (ref >90)
GFR calc non Af Amer: 29 mL/min — ABNORMAL LOW (ref >90)
Glucose, Bld: 179 mg/dL — ABNORMAL HIGH (ref 70–99)
Phosphorus: 4 mg/dL (ref 2.3–4.6)
Potassium: 3.8 mEq/L (ref 3.5–5.1)
Sodium: 131 mEq/L — ABNORMAL LOW (ref 135–145)

## 2012-03-11 LAB — CBC
HCT: 24.5 % — ABNORMAL LOW (ref 39.0–52.0)
Hemoglobin: 8.2 g/dL — ABNORMAL LOW (ref 13.0–17.0)
MCH: 29.5 pg (ref 26.0–34.0)
MCHC: 33.5 g/dL (ref 30.0–36.0)
MCV: 88.1 fL (ref 78.0–100.0)
Platelets: 197 10*3/uL (ref 150–400)
RBC: 2.78 MIL/uL — ABNORMAL LOW (ref 4.22–5.81)
RDW: 16.8 % — ABNORMAL HIGH (ref 11.5–15.5)
WBC: 12.1 10*3/uL — ABNORMAL HIGH (ref 4.0–10.5)

## 2012-03-11 MED ORDER — FUROSEMIDE 10 MG/ML IJ SOLN
80.0000 mg | Freq: Three times a day (TID) | INTRAMUSCULAR | Status: DC
  Administered 2012-03-11 – 2012-03-12 (×3): 80 mg via INTRAVENOUS
  Filled 2012-03-11 (×3): qty 8

## 2012-03-11 MED ORDER — DARBEPOETIN ALFA-POLYSORBATE 100 MCG/0.5ML IJ SOLN
100.0000 ug | INTRAMUSCULAR | Status: DC
  Administered 2012-03-11: 100 ug via SUBCUTANEOUS
  Filled 2012-03-11: qty 0.5

## 2012-03-11 MED ORDER — POTASSIUM CHLORIDE CRYS ER 20 MEQ PO TBCR
20.0000 meq | EXTENDED_RELEASE_TABLET | Freq: Every day | ORAL | Status: DC
  Administered 2012-03-11 – 2012-03-15 (×5): 20 meq via ORAL
  Filled 2012-03-11 (×5): qty 1

## 2012-03-11 NOTE — Plan of Care (Signed)
Problem: Phase III Progression Outcomes Goal: Ambulates with pain/dyspnea controlled Outcome: Not Progressing Pt unable to stand d/t pain left thigh and abdomen, volume overload. Requires sky lift to sit in chair, does not stand

## 2012-03-11 NOTE — Progress Notes (Signed)
TCTS BRIEF SICU PROGRESS NOTE  6 Days Post-Op  S/P Procedure(s) (LRB): CORONARY ARTERY BYPASS GRAFTING (CABG) (N/A)   Stable day  Plan: Continue current plan  [name] 03/11/2012 4:04 PM

## 2012-03-11 NOTE — Progress Notes (Signed)
   CARDIOTHORACIC SURGERY PROGRESS NOTE   R6 Days Post-Op Procedure(s) (LRB): CORONARY ARTERY BYPASS GRAFTING (CABG) (N/A)  Subjective: No complaints except sore in left groin and leg.  Unable to stand up.  Objective: Vital signs: BP Readings from Last 1 Encounters:  03/11/12 131/84   Pulse Readings from Last 1 Encounters:  03/11/12 88   Resp Readings from Last 1 Encounters:  03/11/12 20   Temp Readings from Last 1 Encounters:  03/11/12 98 F (36.7 C) Oral    Hemodynamics:    Physical Exam:  Rhythm:   Sinus w/ PAC's  Breath sounds: Fairly clear  Heart sounds:  RRR  Incisions:  Clean and dry  Abdomen:  soft  Extremities:  warm   Intake/Output from previous day: 09/21 0701 - 09/22 0700 In: 1260 [P.O.:1240; I.V.:20] Out: 1995 [Urine:1995] Intake/Output this shift: Total I/O In: 180 [P.O.:180] Out: 450 [Urine:450]  Lab Results:  [facility] 03/11/12 0426 03/10/12 0640  WBC 12.1* 11.2*  HGB 8.2* 8.9*  HCT 24.5* 27.1*  PLT 197 146*   BMET:  Basename 03/11/12 0415 03/10/12 0640  NA 131* 136  K 3.8 3.8  CL 96 98  CO2 24 25  GLUCOSE 179* 144*  BUN 61* 55*  CREATININE 2.14* 2.29*  CALCIUM 8.6 8.5    CBG (last 3)   Basename 03/11/12 0716 03/11/12 0309 03/10/12 2317  GLUCAP 173* 177* 188*   ABG    Component Value Date/Time   PHART 7.318* 03/06/2012 0732   HCO3 22.5 03/06/2012 0732   TCO2 20 03/06/2012 1651   ACIDBASEDEF 3.0* 03/06/2012 0732   O2SAT 95.0 03/06/2012 0732   CXR: n/a  Assessment/Plan: S/P Procedure(s) (LRB): CORONARY ARTERY BYPASS GRAFTING (CABG) (N/A)  Overall stable Postop Afib, currently maintaining NSR on amiodarone  Still very volume-overloaded, >25 lbs above baseline - I/O's essentially even yesterday Acute on chronic renal insufficiency, creatinine slightly improved  Expected post op acute blood loss anemia, mild, stable  Type II diabetes mellitus, improved glycemic control  Preop retroperitoneal bleed  Mobilize Would  benefit from daily physical therapy Diuretic management per Nephrology team  Continue amiodarone  Continue lantus + SSI  Keep foley  [name],[name] 03/11/2012 9:02 AM

## 2012-03-11 NOTE — Progress Notes (Signed)
[facility] Progress Note    Subjective:   Patient states he is "doing all right" today. He states he has pain when he moves, but otherwise no complaints. His Lasix dose was decreased yesterday, and he continues to have good UOP with 1995cc/24 hours (Net of 700 out) but less with less lasix. All of his IV medications were d/c yesterday as well and he is taking good PO.  He continues to look better every day   Objective:   BP 131/84  Pulse 87  Temp 98 F (36.7 C) (Oral)  Resp 23  Ht 5\' 10"  (1.778 m)  Wt 281 lb 12 oz (127.8 kg)  BMI 40.43 kg/m2  SpO2 98%  Intake/Output Summary (Last 24 hours) at 03/11/12 0915 Last data filed at 03/11/12 0800  Gross per 24 hour  Intake   1200 ml  Output   2270 ml  Net  -1070 ml   Weight change: -3.5 oz (-0.1 kg)  Physical Exam: General: VSS, Elderly WM, obese body habitus. Sitting up in chair, NAD Head: Normocephalic, atraumatic. Hearing aid in left ear Eyes: Right eye blindness with enucleation Neck: Right central cath in IJ; very short, thick neck  Lungs: Poor respiratory effort, decreased breath sounds in lower lobes bilaterally but no wheezes appreciated Chest Central midline incision healing well Heart: Irregular rhythm. Distant heart sounds  Abdomen: Protuberant, distended, non-tender, +bowel sounds.  Extremities: 1+ pitting edema of hands, 1+ pitting edema of feet, trace pretibial edema of LLE. Lower extremities with medial thigh incisions bilaterally  Neurologic: Decreased hearing; good grip strength. Able to follow commands. Skin: No visible rashes  Imaging: Dg Chest Port 1 View  03/10/2012  *RADIOLOGY REPORT*  Clinical Data: Left sided airspace opacity.  Postoperative for CABG.  PORTABLE CHEST - 1 VIEW  Comparison: Multiple exams, including 03/09/2012  Findings: Continued airspace opacity in the left lower lobe obscures the left hemidiaphragm.  Mild cardiomegaly and postoperative findings related to prior CABG noted.   Low lung volumes are present, causing crowding of the pulmonary vasculature.  Linear opacities noted at the right lung base, probably atelectasis.  Equivocal airway thickening noted.  Right internal jugular introducer sheath has been removed.  No pneumothorax.  IMPRESSION:  1.  Right IJ introducer sheath is then removed. 2.  Continued airspace opacity at the left lung base, potentially from atelectasis with layering pleural effusion or pneumonia. 3.  Low lung volumes. 4.  Subsegmental atelectasis in the right lower lobe.   Original Report Authenticated By: Dellia Cloud, M.D.     Labs: BMET  Lab 03/11/12 0415 03/10/12 0640 03/09/12 0520 03/08/12 0400 03/07/12 0400 03/06/12 1656 03/06/12 1651 03/06/12 0410  NA 131* 136 130* 136 133* 131* 137 --  K 3.8 3.8 4.0 3.7 4.3 4.3 4.6 --  CL 96 98 94* 101 101 99 102 --  CO2 24 25 24 23 22 22  -- 23  GLUCOSE 179* 144* 154* 118* 176* 200* 192* --  BUN 61* 55* 48* 43* 38* 36* 34* --  CREATININE 2.14* 2.29* 2.35* 2.46* 2.60* 2.51* 2.70* --  ALB -- -- -- -- -- -- -- --  CALCIUM 8.6 8.5 8.1* 7.5* 7.4* 7.4* -- 7.4*  PHOS 4.0 -- 3.7 -- -- -- -- --   CBC  Lab 03/11/12 0426 03/10/12 0640 03/09/12 0520 03/08/12 0400  WBC 12.1* 11.2* 11.1* 11.5*  NEUTROABS -- -- -- --  HGB 8.2* 8.9* 9.2* 8.4*  HCT 24.5* 27.1* 26.7* 24.5*  MCV 88.1 87.7  87.0 86.6  PLT 197 146* 134* 108*    Medications:       . acetaminophen  1,000 mg Oral Q6H   Or  . acetaminophen (TYLENOL) oral liquid 160 mg/5 mL  975 mg Per Tube Q6H  . amiodarone  200 mg Oral Q12H   Followed by  . amiodarone  200 mg Oral Daily  . aspirin EC  325 mg Oral Daily   Or  . aspirin  324 mg Per Tube Daily  . atorvastatin  80 mg Oral q1800  . bisacodyl  10 mg Oral Daily   Or  . bisacodyl  10 mg Rectal Daily  . brimonidine  1 drop Left Eye BID  . docusate sodium  200 mg Oral Daily  . dorzolamide-timolol  1 drop Left Eye BID  . erythromycin  1 application Right Eye BID  . ezetimibe  10 mg Oral  Daily  . ferrous sulfate  325 mg Oral Q breakfast  . furosemide  80 mg Intravenous Q12H  . insulin aspart  0-24 Units Subcutaneous Q4H  . insulin glargine  22 Units Subcutaneous BID  . ipratropium  1 spray Nasal BID  . levalbuterol  0.63 mg Nebulization TID  . loteprednol  1 drop Right Eye BID  . metoprolol tartrate  25 mg Oral BID   Or  . metoprolol tartrate  25 mg Per Tube BID  . pantoprazole  40 mg Oral Q1200  . polyvinyl alcohol  1 drop Both Eyes BID  . DISCONTD: furosemide  80 mg Intravenous Q8H    Assessment/ Plan:   Pt is a 72 y.o. 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   Acute Kidney Injury - The patient has well documented CKD - diabetic nephropathy with a baseline creatinine of 1.7 prior to admission. He is a patient of Dr. Eliott Nine at [facility], who prescribed the patient ramipril for treatment of his proteinuria. It appears that this medication was stopped prior to his cardiac catheterization, and his acute kidney injury did not occur until after a week after the catheterization, making contrast nephropathy very unlikely. The patient had a significant retroperitoneal bleed overnight prior to surgery, which seemed to set off the bump in creatinine. This indicates that the blood loss likely caused acute tubular necrosis, evidenced by a BUN/Cr < 15. Normokalemia at 3.8 s/p repletion 9/20.  Mag 1.9 - Creatinine stable at 2.14, 2L UOP but only 735 mL net negative yesterday on furosemide 80 mg BID but still up 25lbs since 9/9 so we need to increase his output for a goal of 1-2L net negative daily, so we will increased lasix to 80 IVTID today and recheck in the AM - Electrolytes stable - will add 20 qday of KCL - cont Daily labs - Continue to monitor strict I/O. Please continue foley cath for accurate I/O given patient inability to ambulate and deconditioning  Bone  Health - Calcium and phos normal, so no need for supplementation right now.   Normocytic Anemia - Likely secondary to acute blood loss from retroperitoneal bleed and blood loss from surgery. It is unlikely that this is related to kidney function, as his Hgb was approximately 12 on the day of admission.  - Hgb 8.9 this morning - PO iron per primary team- will add aranesp as well   Cardiology - Patient s/p 4 vessel CABG on 9/16.  - All  drips d/c yesterday. Maintaining BP.  - Continue PO amiodarone, metoprolol per cards   DVT PPX - SCD's  Si Raider. Clinton Sawyer, M.D.  03/11/2012, 9:15 AM   Patient seen and examined, agree with above note with above modifications.  71 year old WM with acute on chronic renal failure in the setting of cath/Cabg and RPB.  He is nonoliguric and creatinine is trending down.  He is still much above his pre op weight so will continue lasix and actually increase dose today and replete K Annie Sable, MD 03/11/2012

## 2012-03-11 NOTE — Progress Notes (Signed)
Subjective:  No c/o of SOB.  He c/o of lower abdominal pain and hip pain, till unable to stand  Objective:  Vital Signs in the last 24 hours: BP 131/84  Pulse 87  Temp 98 F (36.7 C) (Oral)  Resp 23  Ht 5\' 10"  (1.778 m)  Wt 127.8 kg (281 lb 12 oz)  BMI 40.43 kg/m2  SpO2 98%  Physical Exam: Obese Wm in NAD very hard of hearing Lungs:  Clear  Cardiac:  Regular rhythm, normal S1 and S2, no S3 Extremities:  2+ edema  Intake/Output from previous day: 09/21 0701 - 09/22 0700 In: 1260 [P.O.:1240; I.V.:20] Out: 1995 [Urine:1995] Weight Filed Weights   03/09/12 0400 03/10/12 0600 03/11/12 0500  Weight: 129.8 kg (286 lb 2.5 oz) 127.9 kg (281 lb 15.5 oz) 127.8 kg (281 lb 12 oz)    Lab Results: Basic Metabolic Panel:  Basename 03/11/12 0415 03/10/12 0640  NA 131* 136  K 3.8 3.8  CL 96 98  CO2 24 25  GLUCOSE 179* 144*  BUN 61* 55*  CREATININE 2.14* 2.29*    CBC:  Basename 03/11/12 0426 03/10/12 0640  WBC 12.1* 11.2*  NEUTROABS -- --  HGB 8.2* 8.9*  HCT 24.5* 27.1*  MCV 88.1 87.7  PLT 197 146*   Telemetry:  Sinus rhythm today  Assessment/Plan:  1. CAD s/p CABG  2. Post-op afib in NSR on po amiodarone 3. Acute on chronic renal insuff  Better 4. Volume overload  Needs diuresis 5. Retroperitoneal hematoma with weakness  Rec:  Continue po amiodarone.  He is really not able to stand well at this time.  Still significantly volume overloaded and needs additional diuresis.   Darden Palmer  MD [facility] Cardiology  03/11/2012, 9:23 AM

## 2012-03-12 DIAGNOSIS — E1165 Type 2 diabetes mellitus with hyperglycemia: Secondary | ICD-10-CM

## 2012-03-12 DIAGNOSIS — IMO0001 Reserved for inherently not codable concepts without codable children: Secondary | ICD-10-CM

## 2012-03-12 LAB — CBC
HCT: 25.4 % — ABNORMAL LOW (ref 39.0–52.0)
Platelets: 206 10*3/uL (ref 150–400)
RBC: 2.9 MIL/uL — ABNORMAL LOW (ref 4.22–5.81)
RDW: 16.3 % — ABNORMAL HIGH (ref 11.5–15.5)
WBC: 12.5 10*3/uL — ABNORMAL HIGH (ref 4.0–10.5)

## 2012-03-12 LAB — GLUCOSE, CAPILLARY
Glucose-Capillary: 154 mg/dL — ABNORMAL HIGH (ref 70–99)
Glucose-Capillary: 161 mg/dL — ABNORMAL HIGH (ref 70–99)
Glucose-Capillary: 188 mg/dL — ABNORMAL HIGH (ref 70–99)
Glucose-Capillary: 218 mg/dL — ABNORMAL HIGH (ref 70–99)
Glucose-Capillary: 227 mg/dL — ABNORMAL HIGH (ref 70–99)

## 2012-03-12 LAB — RENAL FUNCTION PANEL
Albumin: 2.2 g/dL — ABNORMAL LOW (ref 3.5–5.2)
Calcium: 8.7 mg/dL (ref 8.4–10.5)
Creatinine, Ser: 2.07 mg/dL — ABNORMAL HIGH (ref 0.50–1.35)
GFR calc non Af Amer: 30 mL/min — ABNORMAL LOW (ref >90)
Phosphorus: 4.2 mg/dL (ref 2.3–4.6)

## 2012-03-12 MED ORDER — INSULIN ASPART 100 UNIT/ML ~~LOC~~ SOLN
4.0000 [IU] | Freq: Three times a day (TID) | SUBCUTANEOUS | Status: DC
  Administered 2012-03-13 – 2012-03-25 (×18): 4 [IU] via SUBCUTANEOUS

## 2012-03-12 MED ORDER — LACTULOSE 10 GM/15ML PO SOLN
30.0000 g | Freq: Every day | ORAL | Status: DC | PRN
  Administered 2012-03-12: 30 g via ORAL
  Filled 2012-03-12: qty 45

## 2012-03-12 MED ORDER — FUROSEMIDE 80 MG PO TABS
160.0000 mg | ORAL_TABLET | Freq: Two times a day (BID) | ORAL | Status: DC
  Administered 2012-03-12 – 2012-03-15 (×7): 160 mg via ORAL
  Filled 2012-03-12 (×9): qty 2

## 2012-03-12 NOTE — Progress Notes (Signed)
[facility] Progress Note    Subjective:   Patient awake and sitting in the chair and feels "fine." No complaints this AM.    Objective:   BP 129/60  Pulse 83  Temp 98.7 F (37.1 C) (Oral)  Resp 19  Ht 5\' 10"  (1.778 m)  Wt 278 lb 7.1 oz (126.3 kg)  BMI 39.95 kg/m2  SpO2 95%  Intake/Output Summary (Last 24 hours) at 03/12/12 0745 Last data filed at 03/12/12 0700  Gross per 24 hour  Intake   1396 ml  Output   3995 ml  Net  -2599 ml   Net output since admission: - 246.5 mL  Weight change: -3 lb 4.9 oz (-1.5 kg)  Temp:  [98.6 F (37 C)-99.9 F (37.7 C)] 98.7 F (37.1 C) (09/23 0400) Pulse Rate:  [73-88] 83  (09/23 0700) Resp:  [17-31] 19  (09/23 0700) BP: (100-155)/(58-84) 129/60 mmHg (09/23 0600) SpO2:  [95 %-100 %] 95 % (09/23 0700) Weight:  [278 lb 7.1 oz (126.3 kg)] 278 lb 7.1 oz (126.3 kg) (09/23 0500)   Physical Exam: General: VSS, Elderly WM, obese body habitus. Sitting up in chair, NAD Head: Normocephalic, atraumatic. Hearing aid in left ear Eyes: Right eye blindness with enucleation Neck: Right central cath in IJ; very short, thick neck  Lungs: Poor respiratory effort, decreased breath sounds in lower lobes bilaterally but no wheezes appreciated Chest Central midline incision healing well Heart: Irregular rhythm. Distant heart sounds  Abdomen: Protuberant, distended, non-tender, +bowel sounds.  Extremities: 1+ pitting edema of hands, 1+ pitting edema of feet, trace pretibial edema of LLE. Lower extremities with medial thigh incisions bilaterally  Neurologic: Decreased hearing; good grip strength. Able to follow commands. Skin: No visible rashes  Imaging: No results found.  Labs: BMET  Lab 03/12/12 0540 03/11/12 0415 03/10/12 0640 03/09/12 0520 03/08/12 0400 03/07/12 0400 03/06/12 1656  NA 133* 131* 136 130* 136 133* 131*  K 3.7 3.8 3.8 4.0 3.7 4.3 4.3  CL 96 96 98 94* 101 101 99  CO2 26 24 25 24 23 22 22   GLUCOSE 177* 179* 144*  154* 118* 176* 200*  BUN 67* 61* 55* 48* 43* 38* 36*  CREATININE 2.07* 2.14* 2.29* 2.35* 2.46* 2.60* 2.51*  ALB -- -- -- -- -- -- --  CALCIUM 8.7 8.6 8.5 8.1* 7.5* 7.4* 7.4*  PHOS 4.2 4.0 -- 3.7 -- -- --   CBC  Lab 03/12/12 0540 03/11/12 0426 03/10/12 0640 03/09/12 0520  WBC 12.5* 12.1* 11.2* 11.1*  NEUTROABS -- -- -- --  HGB 8.5* 8.2* 8.9* 9.2*  HCT 25.4* 24.5* 27.1* 26.7*  MCV 87.6 88.1 87.7 87.0  PLT 206 197 146* 134*    Medications:       . amiodarone  200 mg Oral Q12H   Followed by  . amiodarone  200 mg Oral Daily  . aspirin EC  325 mg Oral Daily   Or  . aspirin  324 mg Per Tube Daily  . atorvastatin  80 mg Oral q1800  . bisacodyl  10 mg Oral Daily   Or  . bisacodyl  10 mg Rectal Daily  . brimonidine  1 drop Left Eye BID  . darbepoetin (ARANESP) injection - NON-DIALYSIS  100 mcg Subcutaneous Q Sun-1800  . docusate sodium  200 mg Oral Daily  . dorzolamide-timolol  1 drop Left Eye BID  . erythromycin  1 application Right Eye BID  . ezetimibe  10 mg Oral Daily  . ferrous sulfate  325 mg Oral Q breakfast  . furosemide  80 mg Intravenous Q8H  . insulin aspart  0-24 Units Subcutaneous Q4H  . insulin glargine  22 Units Subcutaneous BID  . ipratropium  1 spray Nasal BID  . levalbuterol  0.63 mg Nebulization TID  . loteprednol  1 drop Right Eye BID  . metoprolol tartrate  25 mg Oral BID   Or  . metoprolol tartrate  25 mg Per Tube BID  . pantoprazole  40 mg Oral Q1200  . polyvinyl alcohol  1 drop Both Eyes BID  . potassium chloride  20 mEq Oral Daily  . DISCONTD: furosemide  80 mg Intravenous Q12H    Assessment/ Plan:   Pt is a 71 y.o. 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   Acute Kidney Injury - The patient has well documented CKD - diabetic nephropathy with a baseline creatinine of 1.7 prior to admission. He is a patient of Dr. Eliott Nine at [facility], who prescribed the patient ramipril for treatment of his proteinuria. It appears that this medication was stopped prior to his cardiac catheterization, and his acute kidney injury did not occur until after a week after the catheterization, making contrast nephropathy very unlikely. The patient had a significant retroperitoneal bleed overnight prior to surgery, which seemed to set off the bump in creatinine. This indicates that the blood loss likely caused acute tubular necrosis, evidenced by a BUN/Cr < 15. Normokalemia at 3.8 s/p repletion 9/20.  Mag 1.9  - Creatinine stable at 2.07, 4L UOP (net neg 2600 mL) on Lasix 80 IV TID so continue for today since patient still 22 pounds above dry weight - cont Daily labs - Continue to monitor strict I/O. Maintain Foley since strict I/O needed.   Electrolytes: - Hyponatremia - likely hypervolemic - Normokalemia - replete with  20 mEq today to prevent drop since continue Lasix  Bone Health - Calcium and phos normal, so no need for supplementation right now.   Normocytic Anemia - Likely secondary to acute blood loss from retroperitoneal bleed and blood loss from surgery. It is unlikely that this is related to kidney function, as his Hgb was approximately 12 on the day of admission.  - Hgb stable in 8-9 range - PO iron per primary team - Added aranesp 9/22, to be given weekly    Cardiology - Patient s/p 4 vessel CABG on 9/16.  - All drips d/c yesterday. Maintaining BP.  - Continue PO amiodarone, metoprolol per cards   DVT PPX - SCD's  Si Raider. Clinton Sawyer, M.D.  03/12/2012, 7:45 AM     I have seen and examined this patient and agree with the plan of care Seen and eval. Change to po lasix.  Mobilize, address epo/fe .  Atwood Adcock L 03/12/2012, 9:30 AM

## 2012-03-12 NOTE — Progress Notes (Signed)
Clinical Social Work-CSW received work that pt would likely d/c from 2300- CSW has left message for insurance in order to update clinicals and will need updated pt/ot notes (preferably from today) CSW provided pt wife with bed offers and prepared her for d/c from SICU within the next few days- CSW will continue to follow for d/c planning- Jodean Lima, [phone]

## 2012-03-12 NOTE — Progress Notes (Signed)
Occupational Therapy Treatment Patient Details Name: [name] MRN: [id number] DOB: 05/17/1941 Today's Date: 03/12/2012 Time: 4034-7425 OT Time Calculation (min): 15 min  OT Assessment / Plan / Recommendation Comments on Treatment Session  Pt is recovering from CABG.  Requiring total care for bathing and dressing and +3 assist for sit to stand from a low surface, but able to perform some grooming in sitting with set up.  Pt put forth good effort.  Wife observing session    Follow Up Recommendations  Skilled nursing facility    Barriers to Discharge       Equipment Recommendations  Rolling walker with 5" wheels    Recommendations for Other Services    Frequency Min 2X/week   Plan Discharge plan remains appropriate    Precautions / Restrictions Precautions Precautions: Fall;Sternal Restrictions Weight Bearing Restrictions: No   Pertinent Vitals/Pain     ADL  Equipment Used: Other (comment) (bariatric stedy) Transfers/Ambulation Related to ADLs: +2 total A pt 40% with use of bariatric stedy. ADL Comments: Dependent in all aspects of bathing and dressing, can wash face with set up.    OT Diagnosis:    OT Problem List:   OT Treatment Interventions:     OT Goals Acute Rehab OT Goals OT Goal Formulation: With patient/family Time For Goal Achievement: 03/21/12 Potential to Achieve Goals: Good  Visit Information  Last OT Received On: 03/12/12 Assistance Needed: +2 PT/OT Co-Evaluation/Treatment: Yes    Subjective Data      Prior Functioning       Cognition  Overall Cognitive Status: Appears within functional limits for tasks assessed/performed Difficult to assess due to: Hard of hearing/deaf Arousal/Alertness: Awake/alert Behavior During Session: [facility] for tasks performed Cognition - Other Comments: difficult to assess cognition due to fatigue and HOH    Mobility  Shoulder Instructions Bed Mobility Bed Mobility: Sit to Supine Supine to Sit: 1: +2 Total  assist Sit to Supine: 1: +2 Total assist;HOB flat Sit to Supine: Patient Percentage: 20% Transfers Transfers: Sit to Stand;Stand to Sit Sit to Stand: 1: +2 Total assist;From chair/3-in-1;Other (comment) (+3) Sit to Stand: Patient Percentage: 40% Stand to Sit: 1: +2 Total assist;To bed Stand to Sit: Patient Percentage: 40% Transfer via Lift Equipment: Stedy Details for Transfer Assistance: stood from chair with stedy after having been up in chair most of morning per wife       Exercises      Balance Balance Balance Assessed: Yes Static Sitting Balance Static Sitting - Balance Support: Bilateral upper extremity supported Static Sitting - Level of Assistance: 4: Min assist Static Sitting - Comment/# of Minutes: 5 minutes.  Pt leaning to the rt. due to fatigue. Static Standing Balance Static Standing - Balance Support: Bilateral upper extremity supported (On stedy) Static Standing - Level of Assistance: 1: +2 Total assist (pt = 70%) Static Standing - Comment/# of Minutes: Stood with Stedy x 1 minute   End of Session OT - End of Session Activity Tolerance: Patient limited by fatigue Patient left: in bed;with call bell/phone within reach;with nursing in room;with family/visitor present Nurse Communication: Mobility status  GO     Evern Bio 03/12/2012, 2:52 PM [phone]

## 2012-03-12 NOTE — Progress Notes (Signed)
Stable day  BP 148/62  Pulse 96  Temp 98.6 F (37 C) (Oral)  Resp 27  Ht 5\' 10"  (1.778 m)  Wt 278 lb 7.1 oz (126.3 kg)  BMI 39.95 kg/m2  SpO2 93%  CBG remain high - will add meal coverage

## 2012-03-12 NOTE — Progress Notes (Signed)
Physical Therapy Treatment Patient Details Name: [name] MRN: [id number] DOB: Jun 07, 1941 Today's Date: 03/12/2012 Time: 5621-3086 PT Time Calculation (min): 28 min  PT Assessment / Plan / Recommendation Comments on Treatment Session  Pt s/p CABG.  Lt leg continues to be weak.    Follow Up Recommendations  Skilled nursing facility    Barriers to Discharge        Equipment Recommendations  Rolling walker with 5" wheels    Recommendations for Other Services    Frequency Min 3X/week   Plan Discharge plan remains appropriate;Frequency remains appropriate    Precautions / Restrictions Precautions Precautions: Fall;Sternal   Pertinent Vitals/Pain VSS    Mobility  Bed Mobility Sit to Supine: 1: +2 Total assist Sit to Supine: Patient Percentage: 10% Transfers Sit to Stand: With upper extremity assist;From chair/3-in-1;Other (comment) (+3 total assist due to pt in low chair) Sit to Stand: Patient Percentage: 40% Stand to Sit: 1: +2 Total assist;To bed Stand to Sit: Patient Percentage: 50% Stand Pivot Transfers: 1: +2 Total assist Transfer via Lift Equipment: Stedy Details for Transfer Assistance: Pt in low chair which made sit to stand diffiult.      Exercises     PT Diagnosis:    PT Problem List:   PT Treatment Interventions:     PT Goals Acute Rehab PT Goals PT Goal: Sit to Supine/Side - Progress: Progressing toward goal PT Goal: Sit to Stand - Progress: Progressing toward goal PT Goal: Stand to Sit - Progress: Progressing toward goal PT Transfer Goal: Bed to Chair/Chair to Bed - Progress: Progressing toward goal  Visit Information  Last PT Received On: 03/12/12 Assistance Needed: +2 PT/OT Co-Evaluation/Treatment: Yes    Subjective Data  Subjective: Pt stated that he was tired.   Cognition  Difficult to assess due to: Hard of hearing/deaf Arousal/Alertness: Awake/alert Behavior During Session: [facility] for tasks performed Cognition - Other Comments: slow to  follow commands due to fatigue and HOH.    Balance  Static Sitting Balance Static Sitting - Balance Support: Bilateral upper extremity supported Static Sitting - Level of Assistance: 4: Min assist Static Sitting - Comment/# of Minutes: 5 minutes.  Pt leaning to the rt. due to fatigue. Static Standing Balance Static Standing - Balance Support: Bilateral upper extremity supported (On stedy) Static Standing - Level of Assistance: 1: +2 Total assist (pt = 70%) Static Standing - Comment/# of Minutes: Stood with Stedy x 1 minute  End of Session PT - End of Session Activity Tolerance: Patient limited by fatigue Patient left: in bed;with nursing in room Nurse Communication: Mobility status;Need for lift equipment   GP     Waleed Dettman 03/12/2012, 2:50 PM  [facility] PT [phone]

## 2012-03-12 NOTE — Progress Notes (Addendum)
7 Days Post-Op Procedure(s) (LRB): CORONARY ARTERY BYPASS GRAFTING (CABG) (N/A) Subjective:  [name] complains of pain in LLE and weakness.  He also complains of constipation.  He states he feels he would be much better if he had a bowel movement.   Objective: Vital signs in last 24 hours: Temp:  [98.1 F (36.7 C)-99.9 F (37.7 C)] 98.1 F (36.7 C) (09/23 0745) Pulse Rate:  [73-87] 83  (09/23 0700) Cardiac Rhythm:  [-] Normal sinus rhythm (09/23 0800) Resp:  [17-31] 19  (09/23 0700) BP: (100-155)/(55-74) 137/55 mmHg (09/23 0800) SpO2:  [95 %-100 %] 95 % (09/23 0700) Weight:  [278 lb 7.1 oz (126.3 kg)] 278 lb 7.1 oz (126.3 kg) (09/23 0500) Intake/Output from previous day: 09/22 0701 - 09/23 0700 In: 1396 [P.O.:1340; I.V.:40; IV Piggyback:16] Out: 3995 [Urine:3995]  General appearance: alert, cooperative and no distress Heart: regular rate and rhythm, S1, S2 normal, no murmur, click, rub or gallop Lungs: clear to auscultation bilaterally Abdomen: soft, non-tender; bowel sounds normal; no masses,  no organomegaly Extremities: edema 2-3+ Wound: clean and dry, ecchymosis LLE  Lab Results:  Basename 03/12/12 0540 03/11/12 0426  WBC 12.5* 12.1*  HGB 8.5* 8.2*  HCT 25.4* 24.5*  PLT 206 197   BMET:  Basename 03/12/12 0540 03/11/12 0415  NA 133* 131*  K 3.7 3.8  CL 96 96  CO2 26 24  GLUCOSE 177* 179*  BUN 67* 61*  CREATININE 2.07* 2.14*  CALCIUM 8.7 8.6    PT/INR: No results found for this basename: LABPROT,INR in the last 72 hours ABG    Component Value Date/Time   PHART 7.318* 03/06/2012 0732   HCO3 22.5 03/06/2012 0732   TCO2 20 03/06/2012 1651   ACIDBASEDEF 3.0* 03/06/2012 0732   O2SAT 95.0 03/06/2012 0732   CBG (last 3)   Basename 03/12/12 0733 03/12/12 0356 03/11/12 2324  GLUCAP 154* 161* 162*    Assessment/Plan: S/P Procedure(s) (LRB): CORONARY ARTERY BYPASS GRAFTING (CABG) (N/A)  1. CV- previous atrial fibrillation, currently NSR continue Amiodarone and  Lopressor 2. Constipation- will order Lactulose prn 3. Previous Retroperitoneal bleed stable 4. Acute on Chronic Renal insufficiency- nephrology following 5. Hypokalemia- will follow 6. Volume Overload- diuresis per Nephrology, weight is up 10kg since admission leave foley in place 7. DM- CBGs moderately controlled 8. Dispo- patient slowly progressing, will benefit from inpatient rehab   LOS: 14 days    BARRETT, ERIN 03/12/2012   I have seen and examined [name] and agree with the above assessment  and plan.  [name] MD Beeper [phone] Office [phone] 03/12/2012 3:41 PM

## 2012-03-12 NOTE — Progress Notes (Signed)
SUBJECTIVE:  Still complains of LLE weakness and numbness  OBJECTIVE:   Vitals:   Filed Vitals:   03/12/12 0600 03/12/12 0700 03/12/12 0745 03/12/12 0800  BP: 129/60   137/55  Pulse: 81 83    Temp:   98.1 F (36.7 C)   TempSrc:   Oral   Resp: 27 19    Height:      Weight:      SpO2: 97% 95%     I&O's:   Intake/Output Summary (Last 24 hours) at 03/12/12 0981 Last data filed at 03/12/12 0700  Gross per 24 hour  Intake   1216 ml  Output   3545 ml  Net  -2329 ml   TELEMETRY: Reviewed telemetry pt in NSR   PHYSICAL EXAM General: Well developed, well nourished, in no acute distress  Lungs:   Clear bilaterally to auscultation and percussion. Heart:   HRRR S1 S2 Pulses are 2+ & equal. Abdomen: Bowel sounds are positive, abdomen soft and non-tender without masses  Extremities:   No clubbing, cyanosis or edema.  DP +1 Neuro: Alert and oriented X 3. Psych:  Good affect, responds appropriately   LABS: Basic Metabolic Panel:  Basename 03/12/12 0540 03/11/12 0415 03/10/12 0640  NA 133* 131* --  K 3.7 3.8 --  CL 96 96 --  CO2 26 24 --  GLUCOSE 177* 179* --  BUN 67* 61* --  CREATININE 2.07* 2.14* --  CALCIUM 8.7 8.6 --  MG -- -- 1.9  PHOS 4.2 4.0 --   Liver Function Tests:  Basename 03/12/12 0540 03/11/12 0415  AST -- --  ALT -- --  ALKPHOS -- --  BILITOT -- --  PROT -- --  ALBUMIN 2.2* 2.1*   No results found for this basename: LIPASE:2,AMYLASE:2 in the last 72 hours CBC:  Basename 03/12/12 0540 03/11/12 0426  WBC 12.5* 12.1*  NEUTROABS -- --  HGB 8.5* 8.2*  HCT 25.4* 24.5*  MCV 87.6 88.1  PLT 206 197   Coag Panel:   Lab Results  Component Value Date   INR 1.47 03/05/2012   INR 1.01 02/27/2012    RADIOLOGY: Ct Abdomen Pelvis Wo Contrast  03/07/2012  *RADIOLOGY REPORT*  Clinical Data: Left-sided abdominal/flank pain.  Recent cardiac catheterization.  Dropping hemoglobin.  CT ABDOMEN AND PELVIS WITHOUT CONTRAST  Technique:  Multidetector CT imaging of the  abdomen and pelvis was performed following the standard protocol without intravenous contrast.  Comparison: None.  Findings: The lung bases demonstrate small effusions and bibasilar atelectasis.  A left-sided chest tube is in place.  There is a large retroperitoneal hematoma on the left this extends from just below the spleen all way down into the extraperitoneal pelvis.  Moderate-sized hematoma also involves the left psoas muscle.  The solid abdominal organs are intact. Bilateral adrenal gland adenomas are noted.  No mesenteric or retroperitoneal mass or adenopathy.  The aorta demonstrates moderate atherosclerotic calcifications.  No focal aneurysm.  There is a Foley catheter in the bladder.  Prostate gland and seminal vesicles are unremarkable.  No inguinal mass or hernia. There is a small right groin hematoma likely from a recent cardiac catheterization.  The bony structures are intact.  Remote healed pubic rami fractures are noted on the right side.  IMPRESSION: Large retroperitoneal abdominal and pelvic hematoma and left psoas hematoma.   Original Report Authenticated By: P. Loralie Champagne, M.D.    Dg Chest 2 View  02/27/2012  *RADIOLOGY REPORT*  Clinical Data: Preop  CHEST -  2 VIEW  Comparison: 08/21/2007  Findings: Normal heart size.  Lungs are under aerated with bibasilar atelectasis.  No pneumothorax and no pleural effusion.  IMPRESSION: Bibasilar atelectasis.   Original Report Authenticated By: Donavan Burnet, M.D.    Dg Chest Port 1 View  03/10/2012  *RADIOLOGY REPORT*  Clinical Data: Left sided airspace opacity.  Postoperative for CABG.  PORTABLE CHEST - 1 VIEW  Comparison: Multiple exams, including 03/09/2012  Findings: Continued airspace opacity in the left lower lobe obscures the left hemidiaphragm.  Mild cardiomegaly and postoperative findings related to prior CABG noted.  Low lung volumes are present, causing crowding of the pulmonary vasculature.  Linear opacities noted at the right lung base,  probably atelectasis.  Equivocal airway thickening noted.  Right internal jugular introducer sheath has been removed.  No pneumothorax.  IMPRESSION:  1.  Right IJ introducer sheath is then removed. 2.  Continued airspace opacity at the left lung base, potentially from atelectasis with layering pleural effusion or pneumonia. 3.  Low lung volumes. 4.  Subsegmental atelectasis in the right lower lobe.   Original Report Authenticated By: Dellia Cloud, M.D.    Dg Chest Port 1 View  03/09/2012  *RADIOLOGY REPORT*  Clinical Data: Postop CABG.  PORTABLE CHEST - 1 VIEW  Comparison: Portable chest x-rays yesterday and dating back to 03/05/2012.  Findings: Sternotomy for CABG.  Cardiac silhouette enlarged but stable.  Improved pulmonary venous hypertension, with normal pulmonary vascularity currently.  Improved aeration in the right lung base, with minimal atelectasis persisting.  Stable dense consolidation in the left lower lobe.  Stable bilateral pleural effusions, left greater than right.  No new pulmonary parenchymal abnormalities.  Right jugular introducer sheath tip remains in the lower IJ.  IMPRESSION: Stable dense left lower lobe atelectasis and/or pneumonia and moderate-sized left pleural effusion.  Improved aeration in the right lung base, with mild atelectasis persisting.  No new abnormalities.   Original Report Authenticated By: Arnell Sieving, M.D.    Dg Chest Port 1 View  03/08/2012  *RADIOLOGY REPORT*  Clinical Data: Postop CABG  PORTABLE CHEST - 1 VIEW  Comparison: 03/07/2012; 03/06/2012; 03/05/2012  Findings: Grossly unchanged cardiac silhouette and mediastinal contours to be decreased lung volumes of patient rotation.  Post median sternotomy and CABG.  Stable positioning of support apparatus.  No definite pneumothorax.  Lung volumes remain persistently reduced with perihilar and bibasilar opacities.  Query small bilateral effusions, though evaluation is limited secondary to exclusion of the  bilateral costophrenic angles.  Unchanged bones.  IMPRESSION: 1.  Stable positioning of support apparatus.  No pneumothorax. 2.  Persistently reduced lung volumes with perihilar and bibasilar opacities, possibly atelectasis.   Original Report Authenticated By: Waynard Reeds, M.D.    Dg Chest Port 1 View  03/07/2012  *RADIOLOGY REPORT*  Clinical Data: Postop CABG 03/05/2012.  PORTABLE CHEST - 1 VIEW  Comparison: 03/06/2012.  Findings: 0550 hours.  Interval extubation with removal of the nasogastric tube and right IJ Swan-Ganz catheter.  A right IJ sheath and left chest tube remain in place.  There are persistent low lung volumes with bibasilar atelectasis and a possible small left pleural effusion.  No pneumothorax is evident.  Heart size and mediastinal contours are stable allowing for rotation and the low lung volumes.  Glenohumeral degenerative changes are noted.  IMPRESSION: No significant change in bibasilar atelectasis following extubation.  No pneumothorax.   Original Report Authenticated By: Gerrianne Scale, M.D.    Dg Chest  Portable 1 View In Am  03/06/2012  *RADIOLOGY REPORT*  Clinical Data: Bypass surgery.  PORTABLE CHEST - 1 VIEW  Comparison: 03/05/2012.  Findings: The endotracheal tube, NG tube and right IJ Swan-Ganz catheters are stable.  The left-sided chest tube is unchanged.  No pneumothorax.  Persistent low lung volumes with vascular crowding and bibasilar atelectasis.  A small left pleural effusion persists.  IMPRESSION:  1.  Stable support apparatus. 2.  Low lung volumes with vascular crowding, basilar atelectasis and small left effusion.   Original Report Authenticated By: P. Loralie Champagne, M.D.    Dg Chest Portable 1 View  03/05/2012  *RADIOLOGY REPORT*  Clinical Data: Bypass surgery.  PORTABLE CHEST - 1 VIEW  Comparison: 03/08/2012.  Findings: The endotracheal tube is in good position, 5 cm above the carina.  The right IJ Swan-Ganz catheter tip was in the proximal right pulmonary  artery.  The NG tube is in the stomach.  There is a left-sided chest tube in good position.  No pneumothorax.  Low lung volumes with vascular crowding and atelectasis.  No edema, effusions or  pneumothorax.  IMPRESSION:  1.  Support apparatus in good position without complicating features. 2.  Low lung volumes with vascular crowding and atelectasis.   Original Report Authenticated By: P. Loralie Champagne, M.D.       ASSESSMENT: 1. CAD s/p CABG  2. Post-op afib in NSR on po amiodarone  3. Acute on chronic renal insuff Better  4. Volume overload Needs diuresis  5. Retroperitoneal hematoma with weakness in LLE    PLAN:   1.  Continue PO Amiodarone 2.  Continue diuresis  Quintella Reichert, MD  03/12/2012  8:32 AM

## 2012-03-12 NOTE — Progress Notes (Signed)
eLink Physician-Brief Progress Note Patient Name: [name] DOB: 1941-04-01 MRN: [id number]  Date of Service  03/12/2012   HPI/Events of Note   DVT PREV  eICU Interventions  SCD   Intervention Category Intermediate Interventions: Best-practice therapies (e.g. DVT, beta blocker, etc.)  Nelda Bucks. 03/12/2012, 4:00 PM

## 2012-03-13 ENCOUNTER — Inpatient Hospital Stay (HOSPITAL_COMMUNITY): Payer: Medicare Other

## 2012-03-13 DIAGNOSIS — E1165 Type 2 diabetes mellitus with hyperglycemia: Secondary | ICD-10-CM

## 2012-03-13 DIAGNOSIS — N390 Urinary tract infection, site not specified: Secondary | ICD-10-CM

## 2012-03-13 DIAGNOSIS — IMO0001 Reserved for inherently not codable concepts without codable children: Secondary | ICD-10-CM

## 2012-03-13 LAB — URINE MICROSCOPIC-ADD ON

## 2012-03-13 LAB — CBC
HCT: 25.9 % — ABNORMAL LOW (ref 39.0–52.0)
Hemoglobin: 8.6 g/dL — ABNORMAL LOW (ref 13.0–17.0)
MCH: 28.9 pg (ref 26.0–34.0)
MCHC: 33.2 g/dL (ref 30.0–36.0)
MCV: 86.9 fL (ref 78.0–100.0)
Platelets: 239 10*3/uL (ref 150–400)
RBC: 2.98 MIL/uL — ABNORMAL LOW (ref 4.22–5.81)
RDW: 16.3 % — ABNORMAL HIGH (ref 11.5–15.5)
WBC: 29.5 10*3/uL — ABNORMAL HIGH (ref 4.0–10.5)

## 2012-03-13 LAB — BASIC METABOLIC PANEL
BUN: 78 mg/dL — ABNORMAL HIGH (ref 6–23)
CO2: 26 mEq/L (ref 19–32)
Calcium: 9 mg/dL (ref 8.4–10.5)
Chloride: 93 mEq/L — ABNORMAL LOW (ref 96–112)
Creatinine, Ser: 2.34 mg/dL — ABNORMAL HIGH (ref 0.50–1.35)
GFR calc Af Amer: 30 mL/min — ABNORMAL LOW (ref >90)
GFR calc non Af Amer: 26 mL/min — ABNORMAL LOW (ref >90)
Glucose, Bld: 152 mg/dL — ABNORMAL HIGH (ref 70–99)
Potassium: 3.8 mEq/L (ref 3.5–5.1)
Sodium: 132 mEq/L — ABNORMAL LOW (ref 135–145)

## 2012-03-13 LAB — OCCULT BLOOD X 1 CARD TO LAB, STOOL: Fecal Occult Bld: POSITIVE

## 2012-03-13 LAB — URINALYSIS, ROUTINE W REFLEX MICROSCOPIC
Bilirubin Urine: NEGATIVE
Ketones, ur: NEGATIVE mg/dL
Nitrite: NEGATIVE
Protein, ur: 100 mg/dL — AB

## 2012-03-13 LAB — VITAMIN B12: Vitamin B-12: 598 pg/mL (ref 211–911)

## 2012-03-13 LAB — GLUCOSE, CAPILLARY
Glucose-Capillary: 157 mg/dL — ABNORMAL HIGH (ref 70–99)
Glucose-Capillary: 126 mg/dL — ABNORMAL HIGH (ref 70–99)
Glucose-Capillary: 100 mg/dL — ABNORMAL HIGH (ref 70–99)

## 2012-03-13 LAB — RETICULOCYTES
RBC.: 2.98 MIL/uL — ABNORMAL LOW (ref 4.22–5.81)
Retic Count, Absolute: 98.3 10*3/uL (ref 19.0–186.0)
Retic Ct Pct: 3.3 % — ABNORMAL HIGH (ref 0.4–3.1)

## 2012-03-13 LAB — FOLATE: Folate: >20 ng/mL

## 2012-03-13 MED ORDER — VANCOMYCIN HCL 1000 MG IV SOLR
750.0000 mg | Freq: Once | INTRAVENOUS | Status: AC
  Administered 2012-03-13: 750 mg via INTRAVENOUS
  Filled 2012-03-13: qty 750

## 2012-03-13 MED ORDER — FERUMOXYTOL INJECTION 510 MG/17 ML
510.0000 mg | Freq: Once | INTRAVENOUS | Status: AC
  Filled 2012-03-13: qty 17

## 2012-03-13 MED ORDER — PIPERACILLIN-TAZOBACTAM 3.375 G IVPB
3.3750 g | Freq: Three times a day (TID) | INTRAVENOUS | Status: DC
  Administered 2012-03-13 – 2012-03-15 (×6): 3.375 g via INTRAVENOUS
  Filled 2012-03-13 (×8): qty 50

## 2012-03-13 MED ORDER — ALBUMIN HUMAN 5 % IV SOLN
INTRAVENOUS | Status: AC
  Administered 2012-03-14: 12.5 g via INTRAVENOUS
  Filled 2012-03-13: qty 250

## 2012-03-13 MED ORDER — FUROSEMIDE 10 MG/ML IJ SOLN
20.0000 mg | Freq: Once | INTRAMUSCULAR | Status: AC
Start: 2012-03-14 — End: 2012-03-14
  Administered 2012-03-14: 20 mg via INTRAVENOUS

## 2012-03-13 MED ORDER — ALBUMIN HUMAN 5 % IV SOLN
12.5000 g | Freq: Once | INTRAVENOUS | Status: AC
  Administered 2012-03-14: 12.5 g via INTRAVENOUS

## 2012-03-13 MED ORDER — MILK AND MOLASSES ENEMA
Freq: Once | RECTAL | Status: DC
  Filled 2012-03-13: qty 250

## 2012-03-13 MED ORDER — FUROSEMIDE 10 MG/ML IJ SOLN
INTRAMUSCULAR | Status: AC
  Administered 2012-03-14: 20 mg via INTRAVENOUS
  Filled 2012-03-13: qty 4

## 2012-03-13 MED ORDER — INSULIN GLARGINE 100 UNIT/ML ~~LOC~~ SOLN
26.0000 [IU] | Freq: Two times a day (BID) | SUBCUTANEOUS | Status: DC
  Administered 2012-03-13 – 2012-03-17 (×10): 26 [IU] via SUBCUTANEOUS

## 2012-03-13 NOTE — Progress Notes (Addendum)
[facility] DAILY PROGRESS NOTE                   [address]            [place] [address]          [phone]      8 Days Post-Op Procedure(s) (LRB): CORONARY ARTERY BYPASS GRAFTING (CABG) (N/A)  Total Length of Stay:  LOS: 15 days   Subjective: Patient has no bowel movement.Denies LLE pain this am.  Objective: Vital signs in last 24 hours: Temp:  [98.1 F (36.7 C)-101.4 F (38.6 C)] 101.4 F (38.6 C) (09/24 0400) Pulse Rate:  [82-130] 98  (09/24 0700) Cardiac Rhythm:  [-] Normal sinus rhythm (09/24 0500) Resp:  [19-42] 25  (09/24 0700) BP: (105-148)/(51-71) 109/55 mmHg (09/24 0700) SpO2:  [90 %-96 %] 96 % (09/24 0700) Weight:  [272 lb 11.3 oz (123.7 kg)] 272 lb 11.3 oz (123.7 kg) (09/24 0700)  Filed Weights   03/11/12 0500 03/12/12 0500 03/13/12 0700  Weight: 281 lb 12 oz (127.8 kg) 278 lb 7.1 oz (126.3 kg) 272 lb 11.3 oz (123.7 kg)   Weight change: -5 lb 11.7 oz (-2.6 kg)     Intake/Output from previous day: 09/23 0701 - 09/24 0700 In: 283 [P.O.:240; I.V.:43] Out: 2146 [Urine:2145; Stool:1]     Current Meds: Scheduled Meds:    . amiodarone  200 mg Oral Q12H   Followed by  . amiodarone  200 mg Oral Daily  . aspirin EC  325 mg Oral Daily   Or  . aspirin  324 mg Per Tube Daily  . atorvastatin  80 mg Oral q1800  . bisacodyl  10 mg Oral Daily   Or  . bisacodyl  10 mg Rectal Daily  . brimonidine  1 drop Left Eye BID  . darbepoetin (ARANESP) injection - NON-DIALYSIS  100 mcg Subcutaneous Q Sun-1800  . docusate sodium  200 mg Oral Daily  . dorzolamide-timolol  1 drop Left Eye BID  . erythromycin  1 application Right Eye BID  . ezetimibe  10 mg Oral Daily  . ferrous sulfate  325 mg Oral Q breakfast  . furosemide  160 mg Oral BID  . insulin aspart  0-24 Units Subcutaneous Q4H  . insulin aspart  4 Units Subcutaneous TID WC  . insulin glargine  22 Units Subcutaneous BID  . ipratropium  1 spray Nasal BID  . levalbuterol  0.63 mg Nebulization  TID  . loteprednol  1 drop Right Eye BID  . metoprolol tartrate  25 mg Oral BID   Or  . metoprolol tartrate  25 mg Per Tube BID  . pantoprazole  40 mg Oral Q1200  . polyvinyl alcohol  1 drop Both Eyes BID  . potassium chloride  20 mEq Oral Daily   Continuous Infusions:    . sodium chloride 40 mL/hr at 03/09/12 0400   PRN Meds:.albuterol, lactulose, metoprolol, morphine injection, ondansetron (ZOFRAN) IV, oxyCODONE, sodium chloride  General appearance: alert and cooperative Neurologic: intact Heart: regular rate and rhythm Lungs: diminished breath sounds bilateral bases and expiratory wheezes  Abdomen: Soft, distended, non tender, rare bowel sounds, tympanic Extremities: 2++ edema bilaterally;ecchymosis LLE  Wounds: mostly clean and dry  Lab Results: CBC:  Basename 03/13/12 0420 03/12/12 0540  WBC 29.5* 12.5*  HGB 8.6* 8.5*  HCT 25.9* 25.4*  PLT 239 206   BMET:   Basename 03/13/12 0420 03/12/12 0540  NA 132* 133*  K 3.8 3.7  CL 93* 96  CO2 26 26  GLUCOSE 152* 177*  BUN 78* 67*  CREATININE 2.34* 2.07*  CALCIUM 9.0 8.7    PT/INR: No results found for this basename: LABPROT,INR in the last 72 hours Radiology: Dg Chest Port 1 View  03/13/2012  *RADIOLOGY REPORT*  Clinical Data: Postoperative radiograph  PORTABLE CHEST - 1 VIEW  Comparison: 03/10/2012  Findings: Hypoaeration with interstitial and vascular crowding. Status post median sternotomy and CABG.  Cardiomegaly.  Bibasilar opacities, left greater than right.  Small amount of pleural fluid suggested.  No pneumothorax identified.  No interval osseous change.  IMPRESSION: Hypoaeration with similar left greater than right lung base opacities.  Favored to reflect atelectasis and a small amount of pleural fluid.   Original Report Authenticated By: Waneta Martins, M.D.      Assessment/Plan: S/P Procedure(s) (LRB): CORONARY ARTERY BYPASS GRAFTING (CABG) (N/A)  1.CV-Previous PAF. Maintaining SR.Continue Lopressor 25  bid, Amiodarone 200 bid. 2. Pulmonary-CXR this am shows low lung volumes, left base atelectasis, no ptx, and probable small left pleural effusion. Encourage incentive spirometer and flutter valve. Xopenex TID. 3.DM-CBGs 227/218/157.Will increase Insulin for better glucose control.  4.ABL anemia- H and H  8.6 and 25.9 this am.Continue Ferrous sulfate  6.CKD-Creatinine 2.34 this am (baseline around 1.7).Per nephrology.Per nephrology, foley to be removed today. 7.Place PICC 8.Volume overload-per nephrology 9.Abdominal distention-no bowel movement in several days despite laxatives. Will check KUB. May have ileus.  Doree Fudge PA-C 03/13/2012 7:39 AM   significant increase in wbc since yesterday Culture sputum blood and urine Start antibiotic coverage pending culture, high risk for pul and urine infection Sternal wound appears intact I have seen and examined [name] and agree with the above assessment  and plan.  [name] MD Beeper [phone] Office [phone] 03/13/2012 10:46 AM

## 2012-03-13 NOTE — Progress Notes (Signed)
Inpatient Diabetes Program Recommendations  AACE/ADA: New Consensus Statement on Inpatient Glycemic Control (2013)  Target Ranges:  Prepandial:   less than 140 mg/dL      Peak postprandial:   less than 180 mg/dL (1-2 hours)      Critically ill patients:  140 - 180 mg/dL   Results for [name], [name] (MRN [id number]) as of 03/13/2012 09:29  Ref. Range 03/11/2012 23:24 03/12/2012 03:56 03/12/2012 07:33 03/12/2012 11:29 03/12/2012 15:27 03/12/2012 20:17  Glucose-Capillary Latest Range: 70-99 mg/dL 782 (H) 956 (H) 213 (H) 188 (H) 195 (H) 227 (H)     Inpatient Diabetes Program Recommendations Insulin - Basal: Noted Lantus increased to 26 units bid today. Correction (SSI): Please change Novolog SSI to ac + HS (currently ordered Q4 hours and patient is eating PO diet). Insulin - Meal Coverage: Noted Novolog 4 units tid with meals (meal coverage) added last evening.  Note: Will follow. Ambrose Finland RN, MSN, CDE Diabetes Coordinator Inpatient Diabetes Program [phone]

## 2012-03-13 NOTE — Progress Notes (Addendum)
                   [address]            [place] [address]          [phone]   POD # 8 s/p CABG x  4  Patient has family visiting  Filed Vitals:   03/13/12 1700  BP: 106/54  Pulse: 129  Temp: 100 (37.8 C)  Resp: 18       Intake/Output Summary (Last 24 hours) at 03/13/12 1816 Last data filed at 03/13/12 1600  Gross per 24 hour  Intake  495.5 ml  Output   1412 ml  Net -916.5 ml   He had a central line placed earlier today. His WBC went from 12,500 to 29,500 and he had a Tmax of 100.4. He has been placed on Zosyn and Vanco. Await UC and BC results.KUB done earlier showed mild gaseous distention of the colon;most likely an ileus. Continue current care  patient examined and medical record reviewed,agree with above note.  Patient resting comfortably, controlled atrial for relation Urine output low 25 cc per hour with poor oral intake we'll give 1 unit of 5% albumin 250 cc VAN TRIGT III,PETER 03/14/2012

## 2012-03-13 NOTE — Progress Notes (Signed)
[facility] Progress Note    Subjective:   Overnight - A fib reported with resolution after beta blockes  Patient with fever to 101.4 this AM and marked leukocytosis; Still no BM  Patient without complaints   Objective:   BP 109/55  Pulse 98  Temp 99.7 F (37.6 C) (Oral)  Resp 25  Ht 5\' 10"  (1.778 m)  Wt 272 lb 11.3 oz (123.7 kg)  BMI 39.13 kg/m2  SpO2 96%  Intake/Output Summary (Last 24 hours) at 03/13/12 0834 Last data filed at 03/13/12 0700  Gross per 24 hour  Intake    283 ml  Output   2071 ml  Net  -1788 ml   Net output since admission: - 246.5 mL  Weight change: -5 lb 11.7 oz (-2.6 kg)  Temp:  [98.6 F (37 C)-101.4 F (38.6 C)] 99.7 F (37.6 C) (09/24 0745) Pulse Rate:  [91-130] 98  (09/24 0700) Resp:  [19-42] 25  (09/24 0700) BP: (105-148)/(51-71) 109/55 mmHg (09/24 0700) SpO2:  [90 %-96 %] 96 % (09/24 0752) Weight:  [272 lb 11.3 oz (123.7 kg)] 272 lb 11.3 oz (123.7 kg) (09/24 0700)   Physical Exam: General: VSS, Elderly WM, obese body habitus. Sitting up in chair, NAD Head: Normocephalic, atraumatic. Hearing aid in left ear Eyes: Right eye blindness with enucleation Neck: Right central cath in IJ; very short, thick neck  Lungs: Poor respiratory effort, decreased breath sounds in lower lobes bilaterally but no wheezes appreciated Chest Central midline incision healing well Heart: Irregular rhythm. Distant heart sounds  Abdomen: Protuberant, distended, non-tender, hypoactive bowel sounds   Extremities: 1+ pitting edema of hands, 1+ pitting edema of feet, trace pretibial edema of LLE. Lower extremities with medial thigh incisions bilaterally  Neurologic: Decreased hearing; good grip strength. Able to follow commands. Skin: No visible rashes Genital: purulent drainage from around Foley   Imaging: Dg Chest Port 1 View  03/13/2012  *RADIOLOGY REPORT*  Clinical Data: Postoperative radiograph  PORTABLE CHEST - 1 VIEW  Comparison: 03/10/2012   Findings: Hypoaeration with interstitial and vascular crowding. Status post median sternotomy and CABG.  Cardiomegaly.  Bibasilar opacities, left greater than right.  Small amount of pleural fluid suggested.  No pneumothorax identified.  No interval osseous change.  IMPRESSION: Hypoaeration with similar left greater than right lung base opacities.  Favored to reflect atelectasis and a small amount of pleural fluid.   Original Report Authenticated By: Waneta Martins, M.D.     Labs: BMET  Lab 03/13/12 0420 03/12/12 0540 03/11/12 0415 03/10/12 0640 03/09/12 0520 03/08/12 0400 03/07/12 0400  NA 132* 133* 131* 136 130* 136 133*  K 3.8 3.7 3.8 3.8 4.0 3.7 4.3  CL 93* 96 96 98 94* 101 101  CO2 26 26 24 25 24 23 22   GLUCOSE 152* 177* 179* 144* 154* 118* 176*  BUN 78* 67* 61* 55* 48* 43* 38*  CREATININE 2.34* 2.07* 2.14* 2.29* 2.35* 2.46* 2.60*  ALB -- -- -- -- -- -- --  CALCIUM 9.0 8.7 8.6 8.5 8.1* 7.5* 7.4*  PHOS -- 4.2 4.0 -- 3.7 -- --   CBC  Lab 03/13/12 0420 03/12/12 0540 03/11/12 0426 03/10/12 0640  WBC 29.5* 12.5* 12.1* 11.2*  NEUTROABS -- -- -- --  HGB 8.6* 8.5* 8.2* 8.9*  HCT 25.9* 25.4* 24.5* 27.1*  MCV 86.9 87.6 88.1 87.7  PLT 239 206 197 146*    Medications:       . amiodarone  200 mg Oral Q12H  Followed by  . amiodarone  200 mg Oral Daily  . aspirin EC  325 mg Oral Daily   Or  . aspirin  324 mg Per Tube Daily  . atorvastatin  80 mg Oral q1800  . bisacodyl  10 mg Oral Daily   Or  . bisacodyl  10 mg Rectal Daily  . brimonidine  1 drop Left Eye BID  . darbepoetin (ARANESP) injection - NON-DIALYSIS  100 mcg Subcutaneous Q Sun-1800  . docusate sodium  200 mg Oral Daily  . dorzolamide-timolol  1 drop Left Eye BID  . erythromycin  1 application Right Eye BID  . ezetimibe  10 mg Oral Daily  . ferrous sulfate  325 mg Oral Q breakfast  . furosemide  160 mg Oral BID  . insulin aspart  0-24 Units Subcutaneous Q4H  . insulin aspart  4 Units Subcutaneous TID WC  .  insulin glargine  26 Units Subcutaneous BID  . ipratropium  1 spray Nasal BID  . levalbuterol  0.63 mg Nebulization TID  . loteprednol  1 drop Right Eye BID  . metoprolol tartrate  25 mg Oral BID   Or  . metoprolol tartrate  25 mg Per Tube BID  . milk and molasses   Rectal Once  . pantoprazole  40 mg Oral Q1200  . polyvinyl alcohol  1 drop Both Eyes BID  . potassium chloride  20 mEq Oral Daily  . DISCONTD: furosemide  80 mg Intravenous Q8H  . DISCONTD: insulin glargine  22 Units Subcutaneous BID    Assessment/ Plan:   Pt is a 71 y.o. 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   Acute Kidney Injury - The patient has well documented CKD - diabetic nephropathy with a baseline creatinine of 1.7 prior to admission. He is a patient of Dr. Eliott Nine at [facility], who prescribed the patient ramipril for treatment of his proteinuria. It appears that this medication was stopped prior to his cardiac catheterization, and his acute kidney injury did not occur until after a week after the catheterization, making contrast nephropathy very unlikely. The patient had a significant retroperitoneal bleed overnight prior to surgery, which seemed to set off the bump in creatinine. This indicates that the blood loss likely caused acute tubular necrosis.  - Creatinine stable, decrease Lasix to 80 mg BID since patient negative > 2L since admission  - PICC line not a good option given low GFR and may push him into worsening renal failure  - D/C foley since possible source of infection. Get clean catch urine with culture in needed.  Fever and Leukocytosis - Started on 9/24; Possible source Foley vs Respiratory and possibly both.  - D/C foley immediately and get urinalysis - I would have a low threshold for treating the patient for hospital acquired pneumonia  Constipation - Obvious concern for  post-operative ileus, no nausea or vomiting - f/u KUB - Ordered for milk of molasses enema, if not effective then sortibol   Electrolytes: - Hyponatremia - likely hypervolemic vs SIADH - Normokalemia - replete with  20 mEq today to prevent drop since continue Lasix  Bone Health - Calcium and phos normal, so no need for supplementation right now.   Normocytic Anemia - Likely secondary to acute blood loss from retroperitoneal bleed and blood loss from surgery. It is unlikely that this is related to kidney function, as his Hgb  was approximately 12 on the day of admission.  - Hgb stable in 8-9 range - PO iron per primary team - Added aranesp 9/22, to be given weekly   - f/u pending anemia panel 9/24  Cardiology - Patient s/p 4 vessel CABG on 9/16.  - All drips d/c yesterday. Maintaining BP.  - Continue PO amiodarone, metoprolol per cards   DVT PPX - SCD's  Si Raider. Clinton Sawyer, M.D.  03/13/2012, 8:34 AM     I have seen and examined this patient and agree with the plan of care Seen and eval. Change to po lasix.  Mobilize, address epo/fe .  Mat Carne 03/13/2012, 8:34 AM

## 2012-03-13 NOTE — Progress Notes (Signed)
Patient ID: [name], male   DOB: 02/06/1941, 71 y.o.   MRN: [id number] Concern of fever and WBC. May be urine but concern of pneu and gut.  Will make sure bowels start to work.

## 2012-03-13 NOTE — Progress Notes (Signed)
Clinical Social Work-CSW has provided bed offers to pt wife and updated clinicals for [facility]- CSW will facilitate d/c to SNF as soon as medically cleared- Jodean Lima, [phone]

## 2012-03-13 NOTE — Progress Notes (Signed)
Nursing Note  Pt in Afib with a rate 120-140's. Schedule Lopressor and Amiodarone PO given early.  Metoprolol 5 mg IV given. Pt converted back to SR/ST with PACs.  L Jasmin Winberry RN

## 2012-03-13 NOTE — Procedures (Signed)
Central Venous Catheter Insertion Procedure Note [name] [id number] 1940/09/24  Procedure: Insertion of Central Venous Catheter Indications: Drug and/or fluid administration, Frequent blood sampling and no perpherial access with renal failure  Procedure Details Consent: Risks of procedure as well as the alternatives and risks of each were explained to the (patient/caregiver).  Consent for procedure obtained. Time Out: Verified patient identification, verified procedure, site/side was marked, verified correct patient position, special equipment/implants available, medications/allergies/relevent history reviewed, required imaging and test results available.  Performed  Maximum sterile technique was used including antiseptics, cap, gloves, gown, hand hygiene, mask, sheet and sono site used. Skin prep: Chlorhexidine; local anesthetic administered A antimicrobial bonded/coated triple lumen catheter was placed in the left subclavian vein using the Seldinger technique.  Evaluation Blood flow good Complications: No apparent complications Patient did tolerate procedure well. Chest X-ray ordered to verify placement.  CXR: pending.  [name] 03/13/2012, 12:01 PM

## 2012-03-14 ENCOUNTER — Inpatient Hospital Stay (HOSPITAL_COMMUNITY): Payer: Medicare Other

## 2012-03-14 LAB — GLUCOSE, CAPILLARY
Glucose-Capillary: 154 mg/dL — ABNORMAL HIGH (ref 70–99)
Glucose-Capillary: 154 mg/dL — ABNORMAL HIGH (ref 70–99)
Glucose-Capillary: 164 mg/dL — ABNORMAL HIGH (ref 70–99)

## 2012-03-14 LAB — BASIC METABOLIC PANEL
BUN: 90 mg/dL — ABNORMAL HIGH (ref 6–23)
CO2: 24 mEq/L (ref 19–32)
GFR calc non Af Amer: 21 mL/min — ABNORMAL LOW (ref >90)
Glucose, Bld: 179 mg/dL — ABNORMAL HIGH (ref 70–99)
Potassium: 3.6 mEq/L (ref 3.5–5.1)
Sodium: 130 mEq/L — ABNORMAL LOW (ref 135–145)

## 2012-03-14 LAB — CBC
HCT: 23.3 % — ABNORMAL LOW (ref 39.0–52.0)
HCT: 23.5 % — ABNORMAL LOW (ref 39.0–52.0)
Hemoglobin: 7.7 g/dL — ABNORMAL LOW (ref 13.0–17.0)
MCH: 28.8 pg (ref 26.0–34.0)
MCH: 28.8 pg (ref 26.0–34.0)
MCHC: 33 g/dL (ref 30.0–36.0)
MCHC: 32.8 g/dL (ref 30.0–36.0)
MCV: 87.3 fL (ref 78.0–100.0)
MCV: 88 fL (ref 78.0–100.0)
Platelets: 225 10*3/uL (ref 150–400)
Platelets: 233 10*3/uL (ref 150–400)
RBC: 2.67 MIL/uL — ABNORMAL LOW (ref 4.22–5.81)
RDW: 16.2 % — ABNORMAL HIGH (ref 11.5–15.5)
RDW: 16.2 % — ABNORMAL HIGH (ref 11.5–15.5)
WBC: 35.8 10*3/uL — ABNORMAL HIGH (ref 4.0–10.5)
WBC: 39.3 10*3/uL — ABNORMAL HIGH (ref 4.0–10.5)

## 2012-03-14 MED ORDER — VANCOMYCIN HCL 1000 MG IV SOLR
1500.0000 mg | INTRAVENOUS | Status: DC
  Filled 2012-03-14: qty 1500

## 2012-03-14 MED ORDER — SODIUM CHLORIDE 0.9 % IV SOLN
125.0000 mg | Freq: Every day | INTRAVENOUS | Status: AC
  Administered 2012-03-14 – 2012-03-20 (×7): 125 mg via INTRAVENOUS
  Filled 2012-03-14 (×12): qty 10

## 2012-03-14 MED ORDER — VANCOMYCIN HCL 1000 MG IV SOLR
750.0000 mg | Freq: Once | INTRAVENOUS | Status: AC
  Administered 2012-03-14: 750 mg via INTRAVENOUS
  Filled 2012-03-14: qty 750

## 2012-03-14 NOTE — Progress Notes (Signed)
[facility] Progress Note    Subjective:   Overnight - A fib reported with resolution after beta blockes  Patient with fever to 101.4 this AM and marked leukocytosis; Still no BM  Patient without complaints   Objective:   BP 125/54  Pulse 99  Temp 99.4 F (37.4 C) (Oral)  Resp 20  Ht 5\' 10"  (1.778 m)  Wt 279 lb 1.6 oz (126.6 kg)  BMI 40.05 kg/m2  SpO2 99%  Intake/Output Summary (Last 24 hours) at 03/14/12 0823 Last data filed at 03/14/12 0800  Gross per 24 hour  Intake  734.5 ml  Output    954 ml  Net -219.5 ml   Net output since admission: - 246.5 mL  Weight change: 6 lb 6.3 oz (2.9 kg)  Temp:  [99 F (37.2 C)-101 F (38.3 C)] 99.4 F (37.4 C) (09/25 0727) Pulse Rate:  [46-133] 99  (09/25 0800) Resp:  [7-32] 20  (09/25 0800) BP: (94-127)/(47-78) 125/54 mmHg (09/25 0800) SpO2:  [95 %-100 %] 99 % (09/25 0820) Weight:  [279 lb 1.6 oz (126.6 kg)] 279 lb 1.6 oz (126.6 kg) (09/25 0600)   Physical Exam: General: VSS, Elderly WM, obese body habitus. Sitting up in chair, NAD Head: Normocephalic, atraumatic. Hearing aid in left ear Eyes: Right eye blindness with enucleation Neck: Right central cath in IJ; very short, thick neck  Lungs: Poor respiratory effort, decreased breath sounds in lower lobes bilaterally but no wheezes appreciated Chest Central midline incision healing well Heart: Irregular rhythm. Distant heart sounds  Abdomen: Protuberant, distended, non-tender, hypoactive bowel sounds   Extremities: 1+ pitting edema of hands, 1+ pitting edema of feet, trace pretibial edema of LLE. Lower extremities with medial thigh incisions bilaterally  Neurologic: Decreased hearing; good grip strength. Able to follow commands. Skin: No visible rashes Genital: purulent drainage from around Foley   Imaging: Dg Chest Port 1 View  03/14/2012  *RADIOLOGY REPORT*  Clinical Data: Postoperative radiograph  PORTABLE CHEST - 1 VIEW  Comparison: 03/13/2012   Findings: Left subclavian central venous catheter with tip projecting over the mid SVC.  Cardiomegaly.  Retrocardiac opacity and pleural effusion on the left.  Right lung remains predominately clear.  No pneumothorax.  No interval osseous change. Status post median sternotomy and CABG.  IMPRESSION: No significant interval change. Retrocardiac opacity and small pleural effusion.   Original Report Authenticated By: Waneta Martins, M.D.    Dg Chest Port 1 View  03/13/2012  *RADIOLOGY REPORT*  Clinical Data: Central line placement.  PORTABLE CHEST - 1 VIEW  Comparison: Earlier same date and 03/10/2012.  Findings: 1234 hours.  Interval left subclavian central venous catheter placement, tip at the level of the SVC right atrial junction.  The heart size and mediastinal contours are stable status post CABG.  There is stable left lower lobe atelectasis and a probable small left pleural effusion.  No pneumothorax is seen.  IMPRESSION: Central line placement as described.  No pneumothorax or other significant change.   Original Report Authenticated By: Gerrianne Scale, M.D.    Dg Chest Port 1 View  03/13/2012  *RADIOLOGY REPORT*  Clinical Data: Postoperative radiograph  PORTABLE CHEST - 1 VIEW  Comparison: 03/10/2012  Findings: Hypoaeration with interstitial and vascular crowding. Status post median sternotomy and CABG.  Cardiomegaly.  Bibasilar opacities, left greater than right.  Small amount of pleural fluid suggested.  No pneumothorax identified.  No interval osseous change.  IMPRESSION: Hypoaeration with similar left greater than right lung base  opacities.  Favored to reflect atelectasis and a small amount of pleural fluid.   Original Report Authenticated By: Waneta Martins, M.D.    Dg Abd Portable 1v  03/13/2012  *RADIOLOGY REPORT*  Clinical Data: Constipation, post CABG, question ileus  PORTABLE ABDOMEN - 1 VIEW  Comparison: 10/18/2005  Findings: Mild gaseous distention of the colon through the  sigmoid loop. No small bowel distention. Gas identified within the stomach. Question of colonic ileus is raised. Bones appear demineralized. No bowel wall thickening.  IMPRESSION: Mild gaseous distention of the colon through the sigmoid loop. While distal colonic obstruction is not entirely excluded, the findings are most likely due to colonic ileus.   Original Report Authenticated By: Lollie Marrow, M.D.     Labs: BMET  Lab 03/14/12 4098 03/13/12 0420 03/12/12 0540 03/11/12 0415 03/10/12 0640 03/09/12 0520 03/08/12 0400  NA 130* 132* 133* 131* 136 130* 136  K 3.6 3.8 3.7 3.8 3.8 4.0 3.7  CL 92* 93* 96 96 98 94* 101  CO2 24 26 26 24 25 24 23   GLUCOSE 179* 152* 177* 179* 144* 154* 118*  BUN 90* 78* 67* 61* 55* 48* 43*  CREATININE 2.77* 2.34* 2.07* 2.14* 2.29* 2.35* 2.46*  ALB -- -- -- -- -- -- --  CALCIUM 8.8 9.0 8.7 8.6 8.5 8.1* 7.5*  PHOS -- -- 4.2 4.0 -- 3.7 --   CBC  Lab 03/14/12 0415 03/13/12 0420 03/12/12 0540 03/11/12 0426  WBC 35.8* 29.5* 12.5* 12.1*  NEUTROABS -- -- -- --  HGB 7.7* 8.6* 8.5* 8.2*  HCT 23.3* 25.9* 25.4* 24.5*  MCV 87.3 86.9 87.6 88.1  PLT 225 239 206 197    Medications:       . albumin human  12.5 g Intravenous Once  . amiodarone  200 mg Oral Q12H   Followed by  . amiodarone  200 mg Oral Daily  . aspirin EC  325 mg Oral Daily   Or  . aspirin  324 mg Per Tube Daily  . atorvastatin  80 mg Oral q1800  . bisacodyl  10 mg Oral Daily   Or  . bisacodyl  10 mg Rectal Daily  . brimonidine  1 drop Left Eye BID  . darbepoetin (ARANESP) injection - NON-DIALYSIS  100 mcg Subcutaneous Q Sun-1800  . docusate sodium  200 mg Oral Daily  . dorzolamide-timolol  1 drop Left Eye BID  . erythromycin  1 application Right Eye BID  . ezetimibe  10 mg Oral Daily  . ferrous sulfate  325 mg Oral Q breakfast  . ferumoxytol  510 mg Intravenous Once  . furosemide  20 mg Intravenous Once  . furosemide  160 mg Oral BID  . insulin aspart  0-24 Units Subcutaneous Q4H  .  insulin aspart  4 Units Subcutaneous TID WC  . insulin glargine  26 Units Subcutaneous BID  . ipratropium  1 spray Nasal BID  . levalbuterol  0.63 mg Nebulization TID  . loteprednol  1 drop Right Eye BID  . metoprolol tartrate  25 mg Oral BID   Or  . metoprolol tartrate  25 mg Per Tube BID  . milk and molasses   Rectal Once  . pantoprazole  40 mg Oral Q1200  . piperacillin-tazobactam (ZOSYN)  IV  3.375 g Intravenous Q8H  . polyvinyl alcohol  1 drop Both Eyes BID  . potassium chloride  20 mEq Oral Daily  . vancomycin  750 mg Intravenous Once    Assessment/ Plan:  Pt is a 71 y.o. 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   Acute Kidney Injury - The patient has well documented CKD - diabetic nephropathy with a baseline creatinine of 1.7 prior to admission. He is a patient of Dr. Eliott Nine at [facility], who prescribed the patient ramipril for treatment of his proteinuria. It appears that this medication was stopped prior to his cardiac catheterization, and his acute kidney injury did not occur until after a week after the catheterization, making contrast nephropathy very unlikely. The patient had a significant retroperitoneal bleed overnight prior to surgery, which seemed to set off the bump in creatinine. This indicates that the blood loss likely caused acute tubular necrosis.  - Creatinine increasing 2 --> 2.7 in 48 hours, decreased UOP since foley removed yesterday so concern for obstruction - Replace foley today, 9/25, since increased creat and decreased UOP - Maintain Lasix 160 mg BID since still net positive > 15 lbs    Fever and Leukocytosis - Started on 9/24; Possible source Foley vs Respiratory and possibly both. Foley d/c'd 9/24, but urinalysis not indicative of true UTI.  - Continued increased in WBC w/o fever - Vanc and Zosyn started 9/24 per primary team - F/u   cultures  Normocytic Anemia - Likely secondary to acute blood loss from retroperitoneal bleed and blood loss from surgery. It is unlikely that this is related to kidney function, as his Hgb was approximately 12 on the day of admission. Fe < 10.   - Patient with active GI bleed contributing, needs GI consult  - Patient with extremely low iron. Will give IV Fe - Nulecit 125 mg x 10 days started 9/24, day 2/10  - Added aranesp 9/22, to be given weekly    GI Bleed - Fecal occult positive 03/13/12; DDX includes ischemic colitis given abdominal pain, distention, and recent retroperitoneal hemorrhage that also damaged kidney - Recommend GI consultation  - Will order PM CBC to make sure not dropping, will given 2 units of Hgb < 7 unless primary team has higher threshold for transfusion   Constipation - post op ileus, improved with 2 BM 9/25 after milk of molasses enema and stopping PO Fe - continue enema PRN  Electrolytes: - Hyponatremia - likely hypervolemic vs SIADH - Normokalemia - replete with  20 mEq today to prevent drop since continue Lasix  Bone Health - Calcium and phos normal, so no need for supplementation right now.   Cardiology - Patient s/p 4 vessel CABG on 9/16.  - All drips d/c yesterday. Maintaining BP.  - Continue PO amiodarone, metoprolol per cards   DVT PPX - SCD's  Si Raider. Clinton Sawyer, M.D.  03/14/2012, 8:23 AM     I have seen and examined this patient and agree with the plan of care Seen and eval. Change to po lasix.  Mobilize, address epo/fe .  Mat Carne 03/14/2012, 8:23 AM I have seen and examined this patient and agree with the plan of care Seen and eval.  Concern over ^^^WBC.  Cr ^, lower urine with foley out,replace.  Suspect GI vs wound sources of WBC. Marland Kitchen  Charlaine Utsey L 03/14/2012, 8:53 AM

## 2012-03-14 NOTE — Progress Notes (Signed)
Patient ID: [name], male   DOB: August 12, 1940, 71 y.o.   MRN: [id number] [facility] DAILY PROGRESS NOTE                   [address]            [place] [id number]          [phone]      9 Days Post-Op Procedure(s) (LRB): CORONARY ARTERY BYPASS GRAFTING (CABG) (N/A)  Total Length of Stay:  LOS: 16 days   Subjective: answers simple questions  Objective: Vital signs in last 24 hours: Temp:  [99 F (37.2 C)-100 F (37.8 C)] 99.4 F (37.4 C) (09/25 0727) Pulse Rate:  [85-133] 87  (09/25 1100) Cardiac Rhythm:  [-] Normal sinus rhythm;Atrial fibrillation (09/25 1100) Resp:  [7-32] 17  (09/25 1100) BP: (94-127)/(47-78) 111/50 mmHg (09/25 1100) SpO2:  [96 %-100 %] 98 % (09/25 1100) Weight:  [279 lb 1.6 oz (126.6 kg)] 279 lb 1.6 oz (126.6 kg) (09/25 0600)  Filed Weights   03/12/12 0500 03/13/12 0700 03/14/12 0600  Weight: 278 lb 7.1 oz (126.3 kg) 272 lb 11.3 oz (123.7 kg) 279 lb 1.6 oz (126.6 kg)    Weight change: 6 lb 6.3 oz (2.9 kg)   Hemodynamic parameters for last 24 hours:    Intake/Output from previous day: 09/24 0701 - 09/25 0700 In: 672 [P.O.:120; IV Piggyback:539.5] Out: 1004 [Urine:1000; Stool:4]  Intake/Output this shift: Total I/O In: 192.5 [P.O.:50; I.V.:20; IV Piggyback:122.5] Out: 410 [Urine:410]  Current Meds: Scheduled Meds:   . albumin human  12.5 g Intravenous Once  . amiodarone  200 mg Oral Q12H   Followed by  . amiodarone  200 mg Oral Daily  . aspirin EC  325 mg Oral Daily   Or  . aspirin  324 mg Per Tube Daily  . atorvastatin  80 mg Oral q1800  . bisacodyl  10 mg Oral Daily   Or  . bisacodyl  10 mg Rectal Daily  . brimonidine  1 drop Left Eye BID  . darbepoetin (ARANESP) injection - NON-DIALYSIS  100 mcg Subcutaneous Q Sun-1800  . docusate sodium  200 mg Oral Daily  . dorzolamide-timolol  1 drop Left Eye BID  . erythromycin  1 application Right Eye BID  . ezetimibe  10 mg Oral Daily  . ferric gluconate  (FERRLECIT/NULECIT) IV  125 mg Intravenous Daily  . ferumoxytol  510 mg Intravenous Once  . furosemide  20 mg Intravenous Once  . furosemide  160 mg Oral BID  . insulin aspart  0-24 Units Subcutaneous Q4H  . insulin aspart  4 Units Subcutaneous TID WC  . insulin glargine  26 Units Subcutaneous BID  . ipratropium  1 spray Nasal BID  . levalbuterol  0.63 mg Nebulization TID  . loteprednol  1 drop Right Eye BID  . metoprolol tartrate  25 mg Oral BID   Or  . metoprolol tartrate  25 mg Per Tube BID  . milk and molasses   Rectal Once  . pantoprazole  40 mg Oral Q1200  . piperacillin-tazobactam (ZOSYN)  IV  3.375 g Intravenous Q8H  . polyvinyl alcohol  1 drop Both Eyes BID  . potassium chloride  20 mEq Oral Daily  . vancomycin  750 mg Intravenous Once  . DISCONTD: ferrous sulfate  325 mg Oral Q breakfast   Continuous Infusions:   . sodium chloride 40 mL/hr at 03/09/12 0400   PRN Meds:.albuterol, lactulose, metoprolol, ondansetron (  ZOFRAN) IV, oxyCODONE, sodium chloride  General appearance: alert, cooperative and slowed mentation Neurologic: intact Heart: regular rate and rhythm, S1, S2 normal, no murmur, click, rub or gallop and normal apical impulse Lungs: diminished breath sounds bilaterally Abdomen: abnormal findings:  distended, obese and bowel sounds present, bm x3 since yeaterday Extremities: no obvious infection of leg incisions Wound: sternum well healed,no obvious sternal instability  Lab Results: CBC: Basename 03/14/12 0415 03/13/12 0420  WBC 35.8* 29.5*  HGB 7.7* 8.6*  HCT 23.3* 25.9*  PLT 225 239   BMET:  Basename 03/14/12 0415 03/13/12 0420  NA 130* 132*  K 3.6 3.8  CL 92* 93*  CO2 24 26  GLUCOSE 179* 152*  BUN 90* 78*  CREATININE 2.77* 2.34*  CALCIUM 8.8 9.0    PT/INR: No results found for this basename: LABPROT,INR in the last 72 hours Radiology: Dg Chest Port 1 View  03/14/2012  *RADIOLOGY REPORT*  Clinical Data: Postoperative radiograph  PORTABLE  CHEST - 1 VIEW  Comparison: 03/13/2012  Findings: Left subclavian central venous catheter with tip projecting over the mid SVC.  Cardiomegaly.  Retrocardiac opacity and pleural effusion on the left.  Right lung remains predominately clear.  No pneumothorax.  No interval osseous change. Status post median sternotomy and CABG.  IMPRESSION: No significant interval change. Retrocardiac opacity and small pleural effusion.   Original Report Authenticated By: Waneta Martins, M.D.    Dg Chest Port 1 View  03/13/2012  *RADIOLOGY REPORT*  Clinical Data: Central line placement.  PORTABLE CHEST - 1 VIEW  Comparison: Earlier same date and 03/10/2012.  Findings: 1234 hours.  Interval left subclavian central venous catheter placement, tip at the level of the SVC right atrial junction.  The heart size and mediastinal contours are stable status post CABG.  There is stable left lower lobe atelectasis and a probable small left pleural effusion.  No pneumothorax is seen.  IMPRESSION: Central line placement as described.  No pneumothorax or other significant change.   Original Report Authenticated By: Gerrianne Scale, M.D.    Dg Chest Port 1 View  03/13/2012  *RADIOLOGY REPORT*  Clinical Data: Postoperative radiograph  PORTABLE CHEST - 1 VIEW  Comparison: 03/10/2012  Findings: Hypoaeration with interstitial and vascular crowding. Status post median sternotomy and CABG.  Cardiomegaly.  Bibasilar opacities, left greater than right.  Small amount of pleural fluid suggested.  No pneumothorax identified.  No interval osseous change.  IMPRESSION: Hypoaeration with similar left greater than right lung base opacities.  Favored to reflect atelectasis and a small amount of pleural fluid.   Original Report Authenticated By: Waneta Martins, M.D.    Dg Abd Portable 1v  03/13/2012  *RADIOLOGY REPORT*  Clinical Data: Constipation, post CABG, question ileus  PORTABLE ABDOMEN - 1 VIEW  Comparison: 10/18/2005  Findings: Mild gaseous  distention of the colon through the sigmoid loop. No small bowel distention. Gas identified within the stomach. Question of colonic ileus is raised. Bones appear demineralized. No bowel wall thickening.  IMPRESSION: Mild gaseous distention of the colon through the sigmoid loop. While distal colonic obstruction is not entirely excluded, the findings are most likely due to colonic ileus.   Original Report Authenticated By: Lollie Marrow, M.D.      Assessment/Plan: S/P Procedure(s) (LRB): CORONARY ARTERY BYPASS GRAFTING (CABG) (N/A) Mobilize Diuresis- 170 this am 150-172 Diabetes control Continue ABX therapy due to  Elevated WBC pending culture negative so far, " dirty urine" on UA Very slow progress , with  elevated WBC decreased HgB, dark stools question from blood loss vs po iron follow up hgb today     [name] MD  Beeper [phone] Office [phone] 03/14/2012 11:27 AM

## 2012-03-14 NOTE — Progress Notes (Signed)
ANTIBIOTIC CONSULT NOTE - INITIAL  Pharmacy Consult for Vancomycin/zosyn Indication: Empiric therapy  No Known Allergies  Patient Measurements: Height: 5\' 10"  (177.8 cm) Weight: 279 lb 1.6 oz (126.6 kg) IBW/kg (Calculated) : 73   Assessment: 70 YOM with CAD, s/p CABG on 9/16, developed leukocytosis in the past 2 days(wbc 35.8 < 29.5 < 12.5). Vancomycin and zosyn started yesterday. Pt. Received vancomycin 750 mg IV x 1. Urine and blood cultures are pending, Tm = 101. Also noted pt. developed acute on chronic renal failure, Scr has been trending up in the past 2 days. Est. crcl 20-17ml/min.   Goal of Therapy:  Vancomycin trough level 15-20 mcg/ml  Plan:  - Continue zosyn 3.375 g IV Q 8 hrs - Vancomycin 750 mg IV x 1 today, then 1500 mg IV Q 48 hrs from tomorrow - Monitor renal function, cultures and clinical improvement.  Bayard Hugger, PharmD, BCPS  Clinical Pharmacist  Pager: [phone]  03/14/2012,12:41 PM

## 2012-03-14 NOTE — Progress Notes (Signed)
Visiting with family  BP 130/62  Pulse 89  Temp 98.4 F (36.9 C) (Oral)  Resp 21  Ht 5\' 10"  (1.778 m)  Wt 279 lb 1.6 oz (126.6 kg)  BMI 40.05 kg/m2  SpO2 95%   Intake/Output Summary (Last 24 hours) at 03/14/12 1824 Last data filed at 03/14/12 1800  Gross per 24 hour  Intake   1512 ml  Output   1245 ml  Net    267 ml    Hgb stable at 7.7

## 2012-03-14 NOTE — Progress Notes (Signed)
Foley inserted at 0900 per renal MD, no urine return when foley was inserted. Bladder scan was preformed to show 493cc of urine in the bladder. Foley was irrigated with 20cc of sterile NS, and then urine began to flow down the tubing.

## 2012-03-14 NOTE — Progress Notes (Signed)
Physical Therapy Treatment Patient Details Name: [name] MRN: [id number] DOB: 1941/01/17 Today's Date: 03/14/2012 Time: 1005-1020 PT Time Calculation (min): 15 min  PT Assessment / Plan / Recommendation Comments on Treatment Session  Pt s/p CABG.  Pt not feeling good today.    Follow Up Recommendations  Skilled nursing facility    Barriers to Discharge        Equipment Recommendations  Rolling walker with 5" wheels    Recommendations for Other Services    Frequency Min 2X/week   Plan Discharge plan remains appropriate;Frequency needs to be updated    Precautions / Restrictions Precautions Precautions: Sternal;Fall Restrictions Weight Bearing Restrictions:  (sternal precautions)   Pertinent Vitals/Pain VSS    Mobility  Bed Mobility Bed Mobility: Sitting - Scoot to Edge of Bed Supine to Sit: 1: +2 Total assist Supine to Sit: Patient Percentage: 40% Sitting - Scoot to Edge of Bed: 1: +2 Total assist Sitting - Scoot to Edge of Bed: Patient Percentage: 40% Sit to Supine: 1: +2 Total assist Sit to Supine: Patient Percentage: 10% Details for Bed Mobility Assistance: Assist to bring trunk up.  Assist to bring trunk down and feet up.    Exercises     PT Diagnosis:    PT Problem List:   PT Treatment Interventions:     PT Goals Acute Rehab PT Goals PT Goal: Supine/Side to Sit - Progress: Not progressing PT Goal: Sit to Supine/Side - Progress: Not progressing PT Goal: Sit to Stand - Progress: Not progressing PT Goal: Stand to Sit - Progress: Not progressing  Visit Information  Last PT Received On: 03/14/12 Assistance Needed: +2    Subjective Data  Subjective: "I feel bad," pt stated after he had been sitting on the edge of the bed for 5 minutes.   Cognition  Overall Cognitive Status: Appears within functional limits for tasks assessed/performed Difficult to assess due to: Hard of hearing/deaf Arousal/Alertness: Awake/alert Behavior During Session: [facility] for  tasks performed    Balance  Static Sitting Balance Static Sitting - Balance Support: Bilateral upper extremity supported;Feet supported Static Sitting - Level of Assistance: 5: Stand by assistance Static Sitting - Comment/# of Minutes: Sat 6-7 minutes before requesting to lie back down due to fatigue and not feeling well.  End of Session PT - End of Session Activity Tolerance: Patient limited by fatigue Patient left: with call bell/phone within reach Nurse Communication: Mobility status;Need for lift equipment   GP     Eraina Winnie 03/14/2012, 10:59 AM  [facility] PT [phone]

## 2012-03-15 LAB — COMPREHENSIVE METABOLIC PANEL
ALT: 253 U/L — ABNORMAL HIGH (ref 0–53)
AST: 338 U/L — ABNORMAL HIGH (ref 0–37)
Albumin: 2.1 g/dL — ABNORMAL LOW (ref 3.5–5.2)
Alkaline Phosphatase: 145 U/L — ABNORMAL HIGH (ref 39–117)
BUN: 104 mg/dL — ABNORMAL HIGH (ref 6–23)
CO2: 24 mEq/L (ref 19–32)
Calcium: 8.6 mg/dL (ref 8.4–10.5)
Chloride: 92 mEq/L — ABNORMAL LOW (ref 96–112)
Creatinine, Ser: 3.45 mg/dL — ABNORMAL HIGH (ref 0.50–1.35)
GFR calc Af Amer: 19 mL/min — ABNORMAL LOW (ref >90)
GFR calc non Af Amer: 16 mL/min — ABNORMAL LOW (ref >90)
Glucose, Bld: 130 mg/dL — ABNORMAL HIGH (ref 70–99)
Potassium: 3.6 mEq/L (ref 3.5–5.1)
Sodium: 130 mEq/L — ABNORMAL LOW (ref 135–145)
Total Bilirubin: 1.5 mg/dL — ABNORMAL HIGH (ref 0.3–1.2)
Total Protein: 5.3 g/dL — ABNORMAL LOW (ref 6.0–8.3)

## 2012-03-15 LAB — CBC
HCT: 22.3 % — ABNORMAL LOW (ref 39.0–52.0)
Hemoglobin: 7.4 g/dL — ABNORMAL LOW (ref 13.0–17.0)
MCH: 29 pg (ref 26.0–34.0)
MCHC: 33.2 g/dL (ref 30.0–36.0)
MCV: 87.5 fL (ref 78.0–100.0)
Platelets: 240 10*3/uL (ref 150–400)
RBC: 2.55 MIL/uL — ABNORMAL LOW (ref 4.22–5.81)
RDW: 16.2 % — ABNORMAL HIGH (ref 11.5–15.5)
WBC: 35.7 10*3/uL — ABNORMAL HIGH (ref 4.0–10.5)

## 2012-03-15 LAB — URINE CULTURE: Colony Count: >=100000

## 2012-03-15 LAB — GLUCOSE, CAPILLARY
Glucose-Capillary: 166 mg/dL — ABNORMAL HIGH (ref 70–99)
Glucose-Capillary: 118 mg/dL — ABNORMAL HIGH (ref 70–99)
Glucose-Capillary: 168 mg/dL — ABNORMAL HIGH (ref 70–99)
Glucose-Capillary: 141 mg/dL — ABNORMAL HIGH (ref 70–99)

## 2012-03-15 LAB — EXPECTORATED SPUTUM ASSESSMENT W GRAM STAIN, RFLX TO RESP C

## 2012-03-15 LAB — TSH: TSH: 4.185 u[IU]/mL (ref 0.350–4.500)

## 2012-03-15 MED ORDER — DEXTROSE 5 % IV SOLN
1.0000 g | INTRAVENOUS | Status: DC
  Administered 2012-03-16 – 2012-03-24 (×9): 1 g via INTRAVENOUS
  Filled 2012-03-15 (×12): qty 1

## 2012-03-15 MED ORDER — DEXTROSE 5 % IV SOLN
1.0000 g | Freq: Once | INTRAVENOUS | Status: AC
  Administered 2012-03-15: 1 g via INTRAVENOUS
  Filled 2012-03-15: qty 1

## 2012-03-15 NOTE — Progress Notes (Signed)
[facility] Progress Note    Subjective:   Overnight - no events, worsening renal function, urine culture pos for pseudomonas     Objective:   BP 107/56  Pulse 77  Temp 97.9 F (36.6 C) (Oral)  Resp 17  Ht 5\' 10"  (1.778 m)  Wt 282 lb 6.6 oz (128.1 kg)  BMI 40.52 kg/m2  SpO2 96%  Intake/Output Summary (Last 24 hours) at 03/15/12 0829 Last data filed at 03/15/12 0800  Gross per 24 hour  Intake 1322.5 ml  Output   1074 ml  Net  248.5 ml   Net output since admission: - 246.5 mL  Weight change: 3 lb 4.9 oz (1.5 kg)  Temp:  [97.9 F (36.6 C)-99 F (37.2 C)] 97.9 F (36.6 C) (09/26 0733) Pulse Rate:  [76-101] 77  (09/26 0800) Resp:  [15-22] 17  (09/26 0800) BP: (91-130)/(45-73) 107/56 mmHg (09/26 0800) SpO2:  [91 %-99 %] 96 % (09/26 0800) Weight:  [282 lb 6.6 oz (128.1 kg)] 282 lb 6.6 oz (128.1 kg) (09/26 0600)   Physical Exam: General: VSS, Elderly WM, obese body habitus. Sitting up in chair, NAD Head: Normocephalic, atraumatic. Hearing aid in left ear Eyes: Right eye blindness with enucleation Neck: Right central cath in IJ; very short, thick neck  Lungs: Poor respiratory effort, decreased breath sounds in lower lobes bilaterally but no wheezes appreciated Chest Central midline incision healing well Heart: Irregular rhythm. Distant heart sounds  Abdomen: Protuberant, distended, non-tender, hypoactive bowel sounds   Extremities: 1+ pitting edema of hands, 1+ pitting edema of feet, trace pretibial edema of LLE. Lower extremities with medial thigh incisions bilaterally  Neurologic: Decreased hearing; good grip strength. Able to follow commands. Skin: No visible rashes Genital: purulent drainage from around Foley   Imaging: Dg Chest Port 1 View  03/14/2012  *RADIOLOGY REPORT*  Clinical Data: Postoperative radiograph  PORTABLE CHEST - 1 VIEW  Comparison: 03/13/2012  Findings: Left subclavian central venous catheter with tip projecting over the mid SVC.   Cardiomegaly.  Retrocardiac opacity and pleural effusion on the left.  Right lung remains predominately clear.  No pneumothorax.  No interval osseous change. Status post median sternotomy and CABG.  IMPRESSION: No significant interval change. Retrocardiac opacity and small pleural effusion.   Original Report Authenticated By: Waneta Martins, M.D.    Dg Chest Port 1 View  03/13/2012  *RADIOLOGY REPORT*  Clinical Data: Central line placement.  PORTABLE CHEST - 1 VIEW  Comparison: Earlier same date and 03/10/2012.  Findings: 1234 hours.  Interval left subclavian central venous catheter placement, tip at the level of the SVC right atrial junction.  The heart size and mediastinal contours are stable status post CABG.  There is stable left lower lobe atelectasis and a probable small left pleural effusion.  No pneumothorax is seen.  IMPRESSION: Central line placement as described.  No pneumothorax or other significant change.   Original Report Authenticated By: Gerrianne Scale, M.D.    Dg Abd Portable 1v  03/13/2012  *RADIOLOGY REPORT*  Clinical Data: Constipation, post CABG, question ileus  PORTABLE ABDOMEN - 1 VIEW  Comparison: 10/18/2005  Findings: Mild gaseous distention of the colon through the sigmoid loop. No small bowel distention. Gas identified within the stomach. Question of colonic ileus is raised. Bones appear demineralized. No bowel wall thickening.  IMPRESSION: Mild gaseous distention of the colon through the sigmoid loop. While distal colonic obstruction is not entirely excluded, the findings are most likely due to colonic ileus.  Original Report Authenticated By: Lollie Marrow, M.D.     Labs: BMET  Lab 03/15/12 0440 03/14/12 9604 03/13/12 0420 03/12/12 0540 03/11/12 0415 03/10/12 0640 03/09/12 0520  NA 130* 130* 132* 133* 131* 136 130*  K 3.6 3.6 3.8 3.7 3.8 3.8 4.0  CL 92* 92* 93* 96 96 98 94*  CO2 24 24 26 26 24 25 24   GLUCOSE 130* 179* 152* 177* 179* 144* 154*  BUN 104* 90* 78*  67* 61* 55* 48*  CREATININE 3.45* 2.77* 2.34* 2.07* 2.14* 2.29* 2.35*  ALB -- -- -- -- -- -- --  CALCIUM 8.6 8.8 9.0 8.7 8.6 8.5 8.1*  PHOS -- -- -- 4.2 4.0 -- 3.7   CBC  Lab 03/15/12 0440 03/14/12 1339 03/14/12 0415 03/13/12 0420  WBC 35.7* 39.3* 35.8* 29.5*  NEUTROABS -- -- -- --  HGB 7.4* 7.7* 7.7* 8.6*  HCT 22.3* 23.5* 23.3* 25.9*  MCV 87.5 88.0 87.3 86.9  PLT 240 233 225 239    Medications:       . amiodarone  200 mg Oral Q12H   Followed by  . amiodarone  200 mg Oral Daily  . aspirin EC  325 mg Oral Daily   Or  . aspirin  324 mg Per Tube Daily  . atorvastatin  80 mg Oral q1800  . bisacodyl  10 mg Oral Daily   Or  . bisacodyl  10 mg Rectal Daily  . brimonidine  1 drop Left Eye BID  . darbepoetin (ARANESP) injection - NON-DIALYSIS  100 mcg Subcutaneous Q Sun-1800  . docusate sodium  200 mg Oral Daily  . dorzolamide-timolol  1 drop Left Eye BID  . erythromycin  1 application Right Eye BID  . ezetimibe  10 mg Oral Daily  . ferric gluconate (FERRLECIT/NULECIT) IV  125 mg Intravenous Daily  . insulin aspart  0-24 Units Subcutaneous Q4H  . insulin aspart  4 Units Subcutaneous TID WC  . insulin glargine  26 Units Subcutaneous BID  . ipratropium  1 spray Nasal BID  . levalbuterol  0.63 mg Nebulization TID  . loteprednol  1 drop Right Eye BID  . metoprolol tartrate  25 mg Oral BID   Or  . metoprolol tartrate  25 mg Per Tube BID  . milk and molasses   Rectal Once  . pantoprazole  40 mg Oral Q1200  . piperacillin-tazobactam (ZOSYN)  IV  3.375 g Intravenous Q8H  . polyvinyl alcohol  1 drop Both Eyes BID  . potassium chloride  20 mEq Oral Daily  . vancomycin  1,500 mg Intravenous Q48H  . vancomycin  750 mg Intravenous Once  . DISCONTD: furosemide  160 mg Oral BID    Assessment/ Plan:   Pt is a 71 y.o. 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   Acute Kidney Injury -  The patient has well documented CKD - diabetic nephropathy with a baseline creatinine of 1.7 prior to admission. He is a patient of Dr. Eliott Nine at [facility], who prescribed the patient ramipril for treatment of his proteinuria. It appears that this medication was stopped prior to his cardiac catheterization, and his acute kidney injury did not occur until after a week after the catheterization, making contrast nephropathy very unlikely. The patient had a significant retroperitoneal bleed overnight prior to surgery, which seemed to set off the bump in creatinine. This indicates that the  blood loss likely caused acute tubular necrosis. Foley replaced 9/25 for oliguria after original removal for infectious concern.   - Creatinine increasing 2 --> 2.7 --> 3.45 in 72 hours, so now concerned that his renal perfusion is being compromised by Lasix, even thought the patient is still volume overloaded in extravascular space  - Stop lasix for today and give 1U PRBC to improve anemia and intravascular volume   Fever and Leukocytosis - Started on 9/24; Possible source Foley vs Respiratory and possibly both. Foley d/c'd 9/24; Foley replaced 9/25 22/ oliguria - Urine culture grew pseudomonas, pan sensitive - primary team will manage - WBC still 35, so hopefully all due to UTI   Normocytic Anemia - Likely secondary to acute blood loss from retroperitoneal bleed and blood loss from surgery. It is unlikely that this is related to kidney function, as his Hgb was approximately 12 on the day of admission. Fe < 10.   - Patient with extremely low iron. Will give IV Fe - Nulecit 125 mg x 10 days started 9/24, day 3/10  - Added aranesp 9/22, to be given weekly  - 1 unit PRBC 9/26   Constipation - post op ileus, improved with 2 BM 9/25 after milk of molasses enema and stopping PO Fe - continue enema PRN  Electrolytes: - Hyponatremia - likely hypervolemic vs SIADH - Normokalemia - replete with  20 mEq today  since 3.6, but hold 9/27 if not on Lasix  Bone Health - Calcium and phos normal, so no need for supplementation right now.   Cardiology - Patient s/p 4 vessel CABG on 9/16.  - All drips d/c yesterday. Maintaining BP.  - Continue PO amiodarone, metoprolol per cards   DVT PPX - SCD's  Si Raider. Clinton Sawyer, M.D.  03/15/2012, 8:29 AM   I have seen and examined this patient and agree with the plan of care  Seen and eval. Very concerned of rising Cr. Hold diuretics, transfuse.  Overall status marginal. .  Dishon Kehoe L 03/15/2012, 12:37 PM

## 2012-03-15 NOTE — Progress Notes (Addendum)
[facility] DAILY PROGRESS NOTE                   [address]            [place] [id number]          [phone]      10 Days Post-Op Procedure(s) (LRB): CORONARY ARTERY BYPASS GRAFTING (CABG) (N/A)  Total Length of Stay:  LOS: 17 days   Subjective: Patient states "belly does not hurt", but does have nausea. Not eating much.  Objective: Vital signs in last 24 hours: Temp:  [97.9 F (36.6 C)-99 F (37.2 C)] 97.9 F (36.6 C) (09/26 0733) Pulse Rate:  [76-101] 77  (09/26 0700) Cardiac Rhythm:  [-] Normal sinus rhythm (09/26 0400) Resp:  [15-22] 15  (09/26 0700) BP: (91-130)/(45-73) 104/46 mmHg (09/26 0700) SpO2:  [91 %-99 %] 95 % (09/26 0700) Weight:  [282 lb 6.6 oz (128.1 kg)] 282 lb 6.6 oz (128.1 kg) (09/26 0600)  Filed Weights   03/13/12 0700 03/14/12 0600 03/15/12 0600  Weight: 272 lb 11.3 oz (123.7 kg) 279 lb 1.6 oz (126.6 kg) 282 lb 6.6 oz (128.1 kg)   Weight change: 3 lb 4.9 oz (1.5 kg)     Intake/Output from previous day: 09/25 0701 - 09/26 0700 In: 1310 [P.O.:800; I.V.:100; IV Piggyback:410] Out: 1039 [Urine:1035; Stool:4]   Current Meds: Scheduled Meds:    . amiodarone  200 mg Oral Q12H   Followed by  . amiodarone  200 mg Oral Daily  . aspirin EC  325 mg Oral Daily   Or  . aspirin  324 mg Per Tube Daily  . atorvastatin  80 mg Oral q1800  . bisacodyl  10 mg Oral Daily   Or  . bisacodyl  10 mg Rectal Daily  . brimonidine  1 drop Left Eye BID  . darbepoetin (ARANESP) injection - NON-DIALYSIS  100 mcg Subcutaneous Q Sun-1800  . docusate sodium  200 mg Oral Daily  . dorzolamide-timolol  1 drop Left Eye BID  . erythromycin  1 application Right Eye BID  . ezetimibe  10 mg Oral Daily  . ferrous sulfate  325 mg Oral Q breakfast  . furosemide  160 mg Oral BID  . insulin aspart  0-24 Units Subcutaneous Q4H  . insulin aspart  4 Units Subcutaneous TID WC  . insulin glargine  22 Units Subcutaneous BID  . ipratropium  1 spray Nasal BID  .  levalbuterol  0.63 mg Nebulization TID  . loteprednol  1 drop Right Eye BID  . metoprolol tartrate  25 mg Oral BID   Or  . metoprolol tartrate  25 mg Per Tube BID  . pantoprazole  40 mg Oral Q1200  . polyvinyl alcohol  1 drop Both Eyes BID  . potassium chloride  20 mEq Oral Daily   Continuous Infusions:    . sodium chloride 40 mL/hr at 03/09/12 0400   PRN Meds:.albuterol, lactulose, metoprolol, ondansetron (ZOFRAN) IV, oxyCODONE, sodium chloride  General appearance: alert and cooperative Neurologic: intact Heart: regular rate and rhythm Lungs: diminished breath sounds bilateral bases  Abdomen: Soft, distended, non tender, rare bowel sounds, tympanic Extremities: 2++ edema bilaterally;ecchymosis LLE  Wounds: mostly clean and dry;left thigh wound dehisced-no signs of infection and minor sero sanguinous ooze  Lab Results: CBC:  Basename 03/15/12 0440 03/14/12 1339  WBC 35.7* 39.3*  HGB 7.4* 7.7*  HCT 22.3* 23.5*  PLT 240 233   BMET:   Basename 03/15/12 0440 03/14/12  0415  NA 130* 130*  K 3.6 3.6  CL 92* 92*  CO2 24 24  GLUCOSE 130* 179*  BUN 104* 90*  CREATININE 3.45* 2.77*  CALCIUM 8.6 8.8    PT/INR: No results found for this basename: LABPROT,INR in the last 72 hours  Radiology: Dg Chest Port 1 View  03/14/2012  *RADIOLOGY REPORT*  Clinical Data: Postoperative radiograph  PORTABLE CHEST - 1 VIEW  Comparison: 03/13/2012  Findings: Left subclavian central venous catheter with tip projecting over the mid SVC.  Cardiomegaly.  Retrocardiac opacity and pleural effusion on the left.  Right lung remains predominately clear.  No pneumothorax.  No interval osseous change. Status post median sternotomy and CABG.  IMPRESSION: No significant interval change. Retrocardiac opacity and small pleural effusion.   Original Report Authenticated By: Waneta Martins, M.D.    Assessment/Plan: S/P Procedure(s) (LRB): CORONARY ARTERY BYPASS GRAFTING (CABG) (N/A)  1.CV-Previous PAF.  Maintaining SR.Continue Lopressor 25 bid, Amiodarone 200 bid. 2. Pulmonary- Encourage incentive spirometer and flutter valve. Xopenex TID. 3.DM-CBGs 166/128/118.Continue Insulin. 4.ABL anemia- H and H  Decreased to 7.4 and 22.3 this am.Continue Ferric gluconate. 6.CKD-Creatinine 3.45 this am (baseline around 1.7).Per nephrology.Foley re inserted as had continued to be in and out cath'd. 8.Volume overload-per nephrology 9.Abdominal distention-with bowel movements.Stool occult blood positive-questionable if true or secondary to iron. 10.Leukocytosis-WBC slightly decreased to 35.7.Remains afebrile. On Zosyn and Vancomycin.UC this am shows Psueodmonas Aeruginosa-most sensitive to Cipro but with CR increasing, consider other antibiotic. 11.Elevated transaminases  Doree Fudge PA-C 03/15/2012 8:02 AM    Worse renal function, discussed with renal, hold lasix give unit of blood , adjust antibiotics for postive  Urine culture  Lab Results  Component Value Date   ALT 253* 03/15/2012   AST 338* 03/15/2012   ALKPHOS 145* 03/15/2012   BILITOT 1.5* 03/15/2012   Hold statin because of elevated lfts I have seen and examined [name] and agree with the above assessment  and plan.  [name] MD Beeper [phone] Office [phone] 03/15/2012 8:39 AM

## 2012-03-15 NOTE — Progress Notes (Signed)
ANTIBIOTIC CONSULT NOTE - INITIAL  Pharmacy Consult for Cefepime Indication: empiric therapy - leukocytosis  No Known Allergies  Patient Measurements: Height: 5\' 10"  (177.8 cm) Weight: 282 lb 6.6 oz (128.1 kg) IBW/kg (Calculated) : 73  Adjusted Body Weight: 90 kg  Vital Signs: Temp: 97.9 F (36.6 C) (09/26 0733) Temp src: Oral (09/26 0733) BP: 107/56 mmHg (09/26 0800) Pulse Rate: 77  (09/26 0800) Intake/Output from previous day: 09/25 0701 - 09/26 0700 In: 1322.5 [P.O.:800; I.V.:100; IV Piggyback:422.5] Out: 1039 [Urine:1035; Stool:4] Intake/Output from this shift: Total I/O In: 62.5 [P.O.:50; IV Piggyback:12.5] Out: 35 [Urine:35]  Labs:  [facility] 03/15/12 0440 03/14/12 1339 03/14/12 0415 03/13/12 0420  WBC 35.7* 39.3* 35.8* --  HGB 7.4* 7.7* 7.7* --  PLT 240 233 225 --  LABCREA -- -- -- --  CREATININE 3.45* -- 2.77* 2.34*   Estimated Creatinine Clearance: 25.6 ml/min (by C-G formula based on Cr of 3.45). No results found for this basename: VANCOTROUGH:2,VANCOPEAK:2,VANCORANDOM:2,GENTTROUGH:2,GENTPEAK:2,GENTRANDOM:2,TOBRATROUGH:2,TOBRAPEAK:2,TOBRARND:2,AMIKACINPEAK:2,AMIKACINTROU:2,AMIKACIN:2, in the last 72 hours   Microbiology: Recent Results (from the past 720 hour(s))  MRSA PCR SCREENING     Status: Normal   Collection Time   02/28/12  2:44 PM      Component Value Range Status Comment   MRSA by PCR NEGATIVE  NEGATIVE Final   SURGICAL PCR SCREEN     Status: Normal   Collection Time   03/04/12 11:21 PM      Component Value Range Status Comment   MRSA, PCR NEGATIVE  NEGATIVE Final    Staphylococcus aureus NEGATIVE  NEGATIVE Final   URINE CULTURE     Status: Normal   Collection Time   03/13/12 12:11 PM      Component Value Range Status Comment   Specimen Description URINE, CLEAN CATCH   Final    Special Requests NONE   Final    Culture  Setup Time 03/13/2012 20:49   Final    Colony Count >=100,000 COLONIES/ML   Final    Culture PSEUDOMONAS AERUGINOSA   Final     Report Status 03/15/2012 FINAL   Final    Organism ID, Bacteria PSEUDOMONAS AERUGINOSA   Final   CULTURE, BLOOD (SINGLE)     Status: Normal (Preliminary result)   Collection Time   03/13/12  3:09 PM      Component Value Range Status Comment   Specimen Description BLOOD ARM LEFT   Final    Special Requests     Final    Value: BOTTLES DRAWN AEROBIC AND ANAEROBIC 10CC AER 4CC ANA   Culture  Setup Time 03/13/2012 20:13   Final    Culture     Final    Value:        BLOOD CULTURE RECEIVED NO GROWTH TO DATE CULTURE WILL BE HELD FOR 5 DAYS BEFORE ISSUING A FINAL NEGATIVE REPORT   Report Status PENDING   Incomplete    Assessment: Pt on cefepime D#1 (total abx D#3) for Pseudomonas UTI. Noted pseudomonas is pan-sensitive except intermediate to imipenem. Continues with leukocytosis (wbc 35.7 < 29.5 < 12.5). Tm = 99.4. Also noted pt. developed acute on chronic renal failure, Scr continues upward trend. Estimated Crcl 26ml/min.  Vanco 9/24>>9/26 Zosyn 9/24>>9/26 Cefepime 9/26>>  9/24 Urine - 100 kcol pseudomonas - pan sensitive except [facility] to imipenem 9/24 Bld  -  pending  Goal of Therapy:  Eradication of infection  Plan:  1) Cefepime 1gm IV q24h. 2) Will follow renal function, cultures, and pt's clinical condition  Christoper Fabian,  PharmD, BCPS Clinical pharmacist, pager [phone] 03/15/2012,9:09 AM

## 2012-03-15 NOTE — Progress Notes (Signed)
Patient ID: [name], male   DOB: [birth year], 71 y.o.   MRN: [id number]   SICU Evening Rounds:  Hemodynamically stable Urine output 30-40/hr. Transfused 1 unit PRBC's today.

## 2012-03-16 ENCOUNTER — Inpatient Hospital Stay (HOSPITAL_COMMUNITY): Payer: Medicare Other

## 2012-03-16 LAB — GLUCOSE, CAPILLARY
Glucose-Capillary: 112 mg/dL — ABNORMAL HIGH (ref 70–99)
Glucose-Capillary: 108 mg/dL — ABNORMAL HIGH (ref 70–99)

## 2012-03-16 LAB — URINE MICROSCOPIC-ADD ON

## 2012-03-16 LAB — URINALYSIS, ROUTINE W REFLEX MICROSCOPIC
Protein, ur: 100 mg/dL — AB
Urobilinogen, UA: 2 mg/dL — ABNORMAL HIGH (ref 0.0–1.0)

## 2012-03-16 LAB — CBC
HCT: 25.5 % — ABNORMAL LOW (ref 39.0–52.0)
Hemoglobin: 8.5 g/dL — ABNORMAL LOW (ref 13.0–17.0)
MCH: 29 pg (ref 26.0–34.0)
MCHC: 33.3 g/dL (ref 30.0–36.0)
MCV: 87 fL (ref 78.0–100.0)
Platelets: 233 10*3/uL (ref 150–400)
RBC: 2.93 MIL/uL — ABNORMAL LOW (ref 4.22–5.81)
RDW: 15.8 % — ABNORMAL HIGH (ref 11.5–15.5)
WBC: 27.3 10*3/uL — ABNORMAL HIGH (ref 4.0–10.5)

## 2012-03-16 LAB — BASIC METABOLIC PANEL
BUN: 113 mg/dL — ABNORMAL HIGH (ref 6–23)
CO2: 23 mEq/L (ref 19–32)
Calcium: 8.6 mg/dL (ref 8.4–10.5)
Chloride: 89 mEq/L — ABNORMAL LOW (ref 96–112)
Creatinine, Ser: 4.2 mg/dL — ABNORMAL HIGH (ref 0.50–1.35)
GFR calc Af Amer: 15 mL/min — ABNORMAL LOW (ref >90)
GFR calc non Af Amer: 13 mL/min — ABNORMAL LOW (ref >90)
Glucose, Bld: 80 mg/dL (ref 70–99)
Potassium: 4.1 mEq/L (ref 3.5–5.1)
Sodium: 127 mEq/L — ABNORMAL LOW (ref 135–145)

## 2012-03-16 LAB — TYPE AND SCREEN
ABO/RH(D): A NEG
Antibody Screen: NEGATIVE
Unit division: 0

## 2012-03-16 LAB — SODIUM, URINE, RANDOM: Sodium, Ur: 15 mEq/L

## 2012-03-16 MED ORDER — METOPROLOL TARTRATE 12.5 MG HALF TABLET
12.5000 mg | ORAL_TABLET | Freq: Two times a day (BID) | ORAL | Status: DC
  Administered 2012-03-16 – 2012-03-27 (×22): 12.5 mg via ORAL
  Filled 2012-03-16 (×24): qty 1

## 2012-03-16 MED ORDER — DARBEPOETIN ALFA-POLYSORBATE 200 MCG/0.4ML IJ SOLN
200.0000 ug | INTRAMUSCULAR | Status: DC
  Administered 2012-03-18 – 2012-03-25 (×2): 200 ug via SUBCUTANEOUS
  Filled 2012-03-16 (×3): qty 0.4

## 2012-03-16 MED ORDER — DOPAMINE-DEXTROSE 3.2-5 MG/ML-% IV SOLN
2.5000 ug/kg/min | INTRAVENOUS | Status: DC
  Administered 2012-03-16 – 2012-03-22 (×4): 2.5 ug/kg/min via INTRAVENOUS
  Filled 2012-03-16 (×4): qty 250

## 2012-03-16 MED ORDER — METOPROLOL TARTRATE 25 MG/10 ML ORAL SUSPENSION
12.5000 mg | Freq: Two times a day (BID) | ORAL | Status: DC
  Filled 2012-03-16 (×24): qty 5

## 2012-03-16 NOTE — Progress Notes (Signed)
TCTs PM rounds  Patient examined and record reviewed.Hemodynamics stable,labs satisfactory.Patient had stable day.Continue current care.  Renal U/S nonrevealing VAN TRIGT III,Adeola Dennen 03/16/2012

## 2012-03-16 NOTE — Progress Notes (Signed)
Physical Therapy Treatment Patient Details Name: [name] MRN: [id number] DOB: 1941-05-24 Today's Date: 03/16/2012 Time: 4696-2952 PT Time Calculation (min): 24 min  PT Assessment / Plan / Recommendation Comments on Treatment Session  Pt s/p CABG.  Pt continues to feel badly and with little activity tolerance.      Follow Up Recommendations  Skilled nursing facility    Barriers to Discharge        Equipment Recommendations  Other (comment) (to be determined as pt progresses.)    Recommendations for Other Services    Frequency Min 2X/week   Plan Discharge plan remains appropriate;Frequency remains appropriate    Precautions / Restrictions Precautions Precautions: Sternal;Fall   Pertinent Vitals/Pain VSS    Mobility  Bed Mobility Supine to Sit: 2: Max assist;HOB elevated Sitting - Scoot to Edge of Bed: 2: Max assist Sit to Supine: 1: +2 Total assist Sit to Supine: Patient Percentage: 10% Details for Bed Mobility Assistance: Used bed pad to bring legs and hips to edge of bed.    Exercises General Exercises - Lower Extremity Ankle Circles/Pumps: AROM;Both;10 reps;Supine Short Arc Quad: AAROM;Both;10 reps;Supine Heel Slides: AAROM;Both;10 reps;Supine   PT Diagnosis:    PT Problem List:   PT Treatment Interventions:     PT Goals Acute Rehab PT Goals PT Goal: Supine/Side to Sit - Progress: Not progressing PT Goal: Sit to Supine/Side - Progress: Not progressing PT Goal: Sit to Stand - Progress: Not progressing PT Goal: Stand to Sit - Progress: Not progressing PT Transfer Goal: Bed to Chair/Chair to Bed - Progress: Not progressing PT Goal: Ambulate - Progress: Not progressing  Visit Information  Last PT Received On: 03/16/12 Assistance Needed: +2    Subjective Data  Subjective: "Let's try," pt stated about sitting on the EOB.   Cognition  Overall Cognitive Status: Appears within functional limits for tasks assessed/performed Difficult to assess due to: Hard  of hearing/deaf Arousal/Alertness: Awake/alert Behavior During Session: [facility] for tasks performed Cognition - Other Comments: Difficulty following commands due to [facility]    Balance  Static Sitting Balance Static Sitting - Balance Support: Bilateral upper extremity supported Static Sitting - Level of Assistance: 4: Min assist Static Sitting - Comment/# of Minutes: Pt only tolerated sitting 2 minutes before needing to return to supine.  End of Session PT - End of Session Activity Tolerance: Patient limited by fatigue Patient left: in bed;with call bell/phone within reach Nurse Communication: Mobility status;Need for lift equipment   GP     Simonne Boulos 03/16/2012, 3:05 PM  [facility] PT [phone]

## 2012-03-16 NOTE — Progress Notes (Signed)
Patient ID: [name], male   DOB: 08-06-1940, 71 y.o.   MRN: [id number] TCTS DAILY PROGRESS NOTE                   [address]            [place] [address]          [phone]      11 Days Post-Op Procedure(s) (LRB): CORONARY ARTERY BYPASS GRAFTING (CABG) (N/A)  Total Length of Stay:  LOS: 18 days   Subjective: Says he feels better today  Objective: Vital signs in last 24 hours: Temp:  [97.8 F (36.6 C)-98.7 F (37.1 C)] 98.3 F (36.8 C) (09/27 0750) Pulse Rate:  [72-88] 76  (09/27 0900) Cardiac Rhythm:  [-] Normal sinus rhythm (09/27 0900) Resp:  [11-22] 14  (09/27 0900) BP: (92-137)/(45-60) 117/60 mmHg (09/27 0900) SpO2:  [91 %-96 %] 93 % (09/27 0911) Weight:  [280 lb 13.9 oz (127.4 kg)] 280 lb 13.9 oz (127.4 kg) (09/27 1000)  Filed Weights   03/14/12 0600 03/15/12 0600 03/16/12 1000  Weight: 279 lb 1.6 oz (126.6 kg) 282 lb 6.6 oz (128.1 kg) 280 lb 13.9 oz (127.4 kg)    Weight change:    Hemodynamic parameters for last 24 hours:    Intake/Output from previous day: 09/26 0701 - 09/27 0700 In: 1326.3 [P.O.:890; I.V.:20; Blood:293.8; IV Piggyback:122.5] Out: 597 [Urine:595; Stool:2]  Intake/Output this shift: Total I/O In: 100 [P.O.:100] Out: 15 [Urine:15]  Current Meds: Scheduled Meds:   . amiodarone  200 mg Oral Q12H   Followed by  . amiodarone  200 mg Oral Daily  . aspirin EC  325 mg Oral Daily   Or  . aspirin  324 mg Per Tube Daily  . bisacodyl  10 mg Oral Daily   Or  . bisacodyl  10 mg Rectal Daily  . brimonidine  1 drop Left Eye BID  . ceFEPime (MAXIPIME) IV  1 g Intravenous Q24H  . darbepoetin (ARANESP) injection - NON-DIALYSIS  100 mcg Subcutaneous Q Sun-1800  . docusate sodium  200 mg Oral Daily  . dorzolamide-timolol  1 drop Left Eye BID  . erythromycin  1 application Right Eye BID  . ezetimibe  10 mg Oral Daily  . ferric gluconate (FERRLECIT/NULECIT) IV  125 mg Intravenous Daily  . insulin aspart  0-24 Units  Subcutaneous Q4H  . insulin aspart  4 Units Subcutaneous TID WC  . insulin glargine  26 Units Subcutaneous BID  . ipratropium  1 spray Nasal BID  . levalbuterol  0.63 mg Nebulization TID  . loteprednol  1 drop Right Eye BID  . metoprolol tartrate  12.5 mg Oral BID   Or  . metoprolol tartrate  12.5 mg Per Tube BID  . pantoprazole  40 mg Oral Q1200  . polyvinyl alcohol  1 drop Both Eyes BID  . DISCONTD: metoprolol tartrate  25 mg Per Tube BID  . DISCONTD: metoprolol tartrate  25 mg Oral BID  . DISCONTD: milk and molasses   Rectal Once  . DISCONTD: potassium chloride  20 mEq Oral Daily   Continuous Infusions:   . sodium chloride 40 mL/hr at 03/09/12 0400  . DOPamine 2.5 mcg/kg/min (03/16/12 1014)   PRN Meds:.albuterol, lactulose, metoprolol, ondansetron (ZOFRAN) IV, oxyCODONE, sodium chloride  General appearance: alert, cooperative and mild distress Neurologic: intact Heart: regular rate and rhythm, S1, S2 normal, no murmur, click, rub or gallop and normal apical impulse Lungs: diminished breath  sounds bibasilar Abdomen: soft, non-tender; bowel sounds normal; no masses,  no organomegaly Extremities: extremities normal, atraumatic, no cyanosis or edema and slight drainage from left thigh endo site does not appear infected Wound: sternal wound stable  Lab Results: CBC: Basename 03/16/12 0415 03/15/12 0440  WBC 27.3* 35.7*  HGB 8.5* 7.4*  HCT 25.5* 22.3*  PLT 233 240   BMET:  Basename 03/16/12 0415 03/15/12 0440  NA 127* 130*  K 4.1 3.6  CL 89* 92*  CO2 23 24  GLUCOSE 80 130*  BUN 113* 104*  CREATININE 4.20* 3.45*  CALCIUM 8.6 8.6    PT/INR: No results found for this basename: LABPROT,INR in the last 72 hours Radiology: Dg Chest Port 1 View  03/16/2012  *RADIOLOGY REPORT*  Clinical Data: Postop check  PORTABLE CHEST - 1 VIEW  Comparison: Prior radiograph 03/14/2012  Findings: The patient is markedly rotated to the right.  Unchanged position of left subclavian approach  central venous catheter with the tip projecting over the distal superior vena cava. Decreased aeration in the left hemithorax likely secondary to a layering pleural effusion and associated atelectasis.  There is some horizontal linear atelectasis in the right lung.  Status post median sternotomy with evidence of multivessel CABG stable cardiomegaly.  IMPRESSION:  1.  Increasing field opacity throughout the left hemithorax most consistent with an enlarging layering pleural effusion and associated atelectasis.  2.  Unchanged mild subsegmental atelectasis in the right base and middle lobe.   Original Report Authenticated By: Vilma Prader      Assessment/Plan: S/P Procedure(s) (LRB): CORONARY ARTERY BYPASS GRAFTING (CABG) (N/A) Mobilize Diabetes control resume low dose dopamine Wbc decreasing Renal function worse Very difficult to get patient moving Renal US today    [name] MD  Beeper [phone] Office [phone] 03/16/2012 11:03 AM

## 2012-03-16 NOTE — Progress Notes (Signed)
[facility] Progress Note    Subjective:   Yesterday patient transfused 1 units PRBC; no events overnight; no complaints this morning   Objective:   BP 126/54  Pulse 88  Temp 98.7 F (37.1 C) (Oral)  Resp 11  Ht 5\' 10"  (1.778 m)  Wt 282 lb 6.6 oz (128.1 kg)  BMI 40.52 kg/m2  SpO2 94%  Intake/Output Summary (Last 24 hours) at 03/16/12 0728 Last data filed at 03/16/12 0600  Gross per 24 hour  Intake 1226.25 ml  Output    597 ml  Net 629.25 ml   Weight change:   Temp:  [97.8 F (36.6 C)-98.7 F (37.1 C)] 98.7 F (37.1 C) (09/27 0320) Pulse Rate:  [72-88] 88  (09/27 0700) Resp:  [11-24] 11  (09/27 0700) BP: (92-137)/(45-58) 126/54 mmHg (09/27 0700) SpO2:  [91 %-96 %] 94 % (09/27 0700)   Physical Exam: General: VSS, Elderly WM, obese body habitus. Sitting up in chair, NAD Head: Normocephalic, atraumatic. Hearing aid in left ear Eyes: Right eye blindness with enucleation Neck: very short, thick neck, no access  Lungs: Poor respiratory effort, decreased breath sounds in lower lobes bilaterally but no wheezes appreciated Chest Central midline incision healing well Heart: Irregular rhythm. Distant heart sounds  Abdomen: Protuberant, distended, non-tender, hypoactive bowel sounds   Extremities: 1+ pitting edema of hands, 1+ pitting edema of feet, 3+ edema of hips and sacrum. Ecchymosis of thighs nears incision sights and right inguinal area  Neurologic: Decreased hearing; good grip strength. Able to follow commands. Skin: No visible rashes Genital: purulent drainage from around Foley   Imaging: No results found.  Labs: BMET  Lab 03/16/12 0415 03/15/12 0440 03/14/12 0415 03/13/12 0420 03/12/12 0540 03/11/12 0415 03/10/12 0640  NA 127* 130* 130* 132* 133* 131* 136  K 4.1 3.6 3.6 3.8 3.7 3.8 3.8  CL 89* 92* 92* 93* 96 96 98  CO2 23 24 24 26 26 24 25   GLUCOSE 80 130* 179* 152* 177* 179* 144*  BUN 113* 104* 90* 78* 67* 61* 55*  CREATININE 4.20* 3.45*  2.77* 2.34* 2.07* 2.14* 2.29*  ALB -- -- -- -- -- -- --  CALCIUM 8.6 8.6 8.8 9.0 8.7 8.6 8.5  PHOS -- -- -- -- 4.2 4.0 --   CBC  Lab 03/16/12 0415 03/15/12 0440 03/14/12 1339 03/14/12 0415  WBC 27.3* 35.7* 39.3* 35.8*  NEUTROABS -- -- -- --  HGB 8.5* 7.4* 7.7* 7.7*  HCT 25.5* 22.3* 23.5* 23.3*  MCV 87.0 87.5 88.0 87.3  PLT 233 240 233 225    Medications:       . amiodarone  200 mg Oral Q12H   Followed by  . amiodarone  200 mg Oral Daily  . aspirin EC  325 mg Oral Daily   Or  . aspirin  324 mg Per Tube Daily  . bisacodyl  10 mg Oral Daily   Or  . bisacodyl  10 mg Rectal Daily  . brimonidine  1 drop Left Eye BID  . ceFEPime (MAXIPIME) IV  1 g Intravenous Once  . ceFEPime (MAXIPIME) IV  1 g Intravenous Q24H  . darbepoetin (ARANESP) injection - NON-DIALYSIS  100 mcg Subcutaneous Q Sun-1800  . docusate sodium  200 mg Oral Daily  . dorzolamide-timolol  1 drop Left Eye BID  . erythromycin  1 application Right Eye BID  . ezetimibe  10 mg Oral Daily  . ferric gluconate (FERRLECIT/NULECIT) IV  125 mg Intravenous Daily  . insulin aspart  0-24 Units Subcutaneous Q4H  . insulin aspart  4 Units Subcutaneous TID WC  . insulin glargine  26 Units Subcutaneous BID  . ipratropium  1 spray Nasal BID  . levalbuterol  0.63 mg Nebulization TID  . loteprednol  1 drop Right Eye BID  . metoprolol tartrate  25 mg Oral BID   Or  . metoprolol tartrate  25 mg Per Tube BID  . milk and molasses   Rectal Once  . pantoprazole  40 mg Oral Q1200  . polyvinyl alcohol  1 drop Both Eyes BID  . potassium chloride  20 mEq Oral Daily  . DISCONTD: atorvastatin  80 mg Oral q1800  . DISCONTD: furosemide  160 mg Oral BID  . DISCONTD: piperacillin-tazobactam (ZOSYN)  IV  3.375 g Intravenous Q8H  . DISCONTD: vancomycin  1,500 mg Intravenous Q48H    Assessment/ Plan:   Pt is a 71 y.o. 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   Acute Kidney Injury - The patient has well documented CKD - diabetic nephropathy with a baseline creatinine of 1.7 prior to admission. He is a patient of Dr. Eliott Nine at [facility], who prescribed the patient ramipril for treatment of his proteinuria. It appears that this medication was stopped prior to his cardiac catheterization, and his acute kidney injury did not occur until after a week after the catheterization, making contrast nephropathy very unlikely. The patient had a significant retroperitoneal bleed overnight prior to surgery, which seemed to set off the bump in creatinine. This indicates that the blood loss likely caused acute tubular necrosis. Foley replaced 9/25 for oliguria after original removal for infectious concern.   - After initial improvement, Creatinine increasing 2 --> 2.7 --> 3.45 -->  4.2 - Lasix held and 1 Units of PRBC given 9/26, but worsening function with continued worsening hyponatremia so concern for mechanism of injury being renal hypoperfusion leading to more ischemic ATN - check renal ultrasound today, urine Na and Creat, and urinalysis - decreased metoprolol to 12.5 mg    Fever and Leukocytosis - Started on 9/24; Possible source Foley vs Respiratory and possibly both. Foley d/c'd 9/24; Foley replaced 9/25 22/ oliguria - Urine culture grew pseudomonas - Respiratory cx still pending   - Blood cx pending   Pseudomonas UTI - positive culture 9/24, pan sensitive  - on cefepime   Normocytic Anemia - Likely secondary to acute blood loss from retroperitoneal bleed and blood loss from surgery. It is unlikely that this is related to kidney function, as his Hgb was approximately 12 on the day of admission. Fe < 10. 1 unit PRBC 9/26  - Nulecit 125 mg x 10 days started 9/24, day 4/10  - Added aranesp 9/22, to be given weekly   Constipation - post op ileus, improved with 2 BM 9/25 after milk of molasses enema and stopping PO Fe -  continue enema PRN  Electrolytes: - Hyponatremia - from increased ADH   Bone Health - Calcium and phos normal, so no need for supplementation right now.   Cardiology - Patient s/p 4 vessel CABG on 9/16.  - All drips d/c yesterday. Maintaining BP.  - decreased metoprolol to 12.5 mg BID on 9/27  DVT PPX - SCD's  Si Raider. Clinton Sawyer, M.D.  03/16/2012, 7:28 AM    I have seen and examined this patient and agree with the plan of care seen  and eval.  Cr^ , R/O cardiac, obstruction , inflam dz.  May need inotropes and CRRT .  Destany Severns L 03/16/2012, 10:54 AM

## 2012-03-17 ENCOUNTER — Inpatient Hospital Stay (HOSPITAL_COMMUNITY): Payer: Medicare Other

## 2012-03-17 LAB — CBC
HCT: 26.9 % — ABNORMAL LOW (ref 39.0–52.0)
Hemoglobin: 9.1 g/dL — ABNORMAL LOW (ref 13.0–17.0)
MCH: 29.1 pg (ref 26.0–34.0)
MCHC: 33.8 g/dL (ref 30.0–36.0)
MCV: 85.9 fL (ref 78.0–100.0)
Platelets: 240 10*3/uL (ref 150–400)
RBC: 3.13 MIL/uL — ABNORMAL LOW (ref 4.22–5.81)
RDW: 15.4 % (ref 11.5–15.5)
WBC: 23.1 10*3/uL — ABNORMAL HIGH (ref 4.0–10.5)

## 2012-03-17 LAB — COMPREHENSIVE METABOLIC PANEL
ALT: 159 U/L — ABNORMAL HIGH (ref 0–53)
AST: 90 U/L — ABNORMAL HIGH (ref 0–37)
Albumin: 2.2 g/dL — ABNORMAL LOW (ref 3.5–5.2)
Alkaline Phosphatase: 164 U/L — ABNORMAL HIGH (ref 39–117)
BUN: 126 mg/dL — ABNORMAL HIGH (ref 6–23)
CO2: 22 mEq/L (ref 19–32)
Calcium: 8.7 mg/dL (ref 8.4–10.5)
Chloride: 88 mEq/L — ABNORMAL LOW (ref 96–112)
Creatinine, Ser: 4.55 mg/dL — ABNORMAL HIGH (ref 0.50–1.35)
GFR calc Af Amer: 13 mL/min — ABNORMAL LOW (ref >90)
GFR calc non Af Amer: 12 mL/min — ABNORMAL LOW (ref >90)
Glucose, Bld: 86 mg/dL (ref 70–99)
Potassium: 3.8 mEq/L (ref 3.5–5.1)
Sodium: 127 mEq/L — ABNORMAL LOW (ref 135–145)
Total Bilirubin: 1.2 mg/dL (ref 0.3–1.2)
Total Protein: 5.7 g/dL — ABNORMAL LOW (ref 6.0–8.3)

## 2012-03-17 LAB — GLUCOSE, CAPILLARY
Glucose-Capillary: 118 mg/dL — ABNORMAL HIGH (ref 70–99)
Glucose-Capillary: 91 mg/dL (ref 70–99)
Glucose-Capillary: 84 mg/dL (ref 70–99)
Glucose-Capillary: 105 mg/dL — ABNORMAL HIGH (ref 70–99)
Glucose-Capillary: 90 mg/dL (ref 70–99)
Glucose-Capillary: 95 mg/dL (ref 70–99)
Glucose-Capillary: 86 mg/dL (ref 70–99)

## 2012-03-17 LAB — CULTURE, RESPIRATORY W GRAM STAIN: Culture: NORMAL

## 2012-03-17 LAB — DIFFERENTIAL
Basophils Absolute: 0.1 10*3/uL (ref 0.0–0.1)
Basophils Relative: 0 % (ref 0–1)
Eosinophils Absolute: 0.4 10*3/uL (ref 0.0–0.7)
Eosinophils Relative: 2 % (ref 0–5)
Lymphocytes Relative: 3 % — ABNORMAL LOW (ref 12–46)
Lymphs Abs: 0.6 10*3/uL — ABNORMAL LOW (ref 0.7–4.0)
Monocytes Absolute: 1.8 10*3/uL — ABNORMAL HIGH (ref 0.1–1.0)
Monocytes Relative: 8 % (ref 3–12)
Neutro Abs: 20.3 10*3/uL — ABNORMAL HIGH (ref 1.7–7.7)
Neutrophils Relative %: 88 % — ABNORMAL HIGH (ref 43–77)

## 2012-03-17 NOTE — Progress Notes (Signed)
[facility] Progress Note    Subjective:   Yesterday patient transfused 1 units PRBC; no events overnight; no complaints this morning   Objective:   BP 106/52  Pulse 88  Temp 98.5 F (36.9 C) (Oral)  Resp 12  Ht 5\' 10"  (1.778 m)  Wt 282 lb 10.1 oz (128.2 kg)  BMI 40.55 kg/m2  SpO2 93%  Intake/Output Summary (Last 24 hours) at 03/17/12 1610 Last data filed at 03/17/12 0600  Gross per 24 hour  Intake 1156.73 ml  Output   1040 ml  Net 116.73 ml   Weight change:   Temp:  [97.8 F (36.6 C)-98.7 F (37.1 C)] 98.5 F (36.9 C) (09/28 0000) Pulse Rate:  [76-103] 88  (09/28 0620) Resp:  [11-25] 12  (09/28 0620) BP: (100-132)/(36-101) 106/52 mmHg (09/28 0600) SpO2:  [88 %-97 %] 93 % (09/28 0620) Weight:  [280 lb 13.9 oz (127.4 kg)-282 lb 10.1 oz (128.2 kg)] 282 lb 10.1 oz (128.2 kg) (09/28 0600)   Physical Exam: General: VSS, Elderly WM, obese body habitus. Sitting up in chair, NAD Head: Normocephalic, atraumatic. Hearing aid in left ear Eyes: Right eye blindness with enucleation Neck: very short, thick neck, no access  Lungs: Poor respiratory effort, decreased breath sounds in lower lobes bilaterally but no wheezes appreciated Chest Central midline incision healing well Heart: Irregular rhythm. Distant heart sounds  Abdomen: Protuberant, distended, non-tender, hypoactive bowel sounds   Extremities: 1+ pitting edema of hands, 1+ pitting edema of feet, 3+ edema of hips and sacrum. Ecchymosis of thighs nears incision sights and right inguinal area  Neurologic: Decreased hearing; good grip strength. Able to follow commands. Skin: No visible rashes Genital: purulent drainage from around Foley   Imaging: US Renal Port  03/16/2012  *RADIOLOGY REPORT*  Clinical Data: Postop renal injury  RENAL/URINARY TRACT ULTRASOUND COMPLETE  Comparison:  CT 03/07/2012  Findings:  Right Kidney:  14.0 cm. Normal size and echotexture.  No focal abnormality.  No hydronephrosis.   Left Kidney:  Difficult to visualize due to the flank hematoma. 12.0 cm.  No hydronephrosis seen.  Otherwise evaluation of the left kidney limited.  Bladder:  Foley catheter place, decompressed.  IMPRESSION: Right kidney unremarkable.  No hydronephrosis on the left, but evaluation of the left kidney limited due to the left flank hematoma.   Original Report Authenticated By: Cyndie Chime, M.D.    Dg Chest Port 1 View  03/16/2012  *RADIOLOGY REPORT*  Clinical Data: Postop check  PORTABLE CHEST - 1 VIEW  Comparison: Prior radiograph 03/14/2012  Findings: The patient is markedly rotated to the right.  Unchanged position of left subclavian approach central venous catheter with the tip projecting over the distal superior vena cava. Decreased aeration in the left hemithorax likely secondary to a layering pleural effusion and associated atelectasis.  There is some horizontal linear atelectasis in the right lung.  Status post median sternotomy with evidence of multivessel CABG stable cardiomegaly.  IMPRESSION:  1.  Increasing field opacity throughout the left hemithorax most consistent with an enlarging layering pleural effusion and associated atelectasis.  2.  Unchanged mild subsegmental atelectasis in the right base and middle lobe.   Original Report Authenticated By: HEATH     Labs: BMET  Lab 03/17/12 0350 03/16/12 0415 03/15/12 0440 03/14/12 0415 03/13/12 0420 03/12/12 0540 03/11/12 0415  NA 127* 127* 130* 130* 132* 133* 131*  K 3.8 4.1 3.6 3.6 3.8 3.7 3.8  CL 88* 89* 92* 92* 93* 96 96  CO2 22 23 24 24 26 26 24   GLUCOSE 86 80 130* 179* 152* 177* 179*  BUN 126* 113* 104* 90* 78* 67* 61*  CREATININE 4.55* 4.20* 3.45* 2.77* 2.34* 2.07* 2.14*  ALB -- -- -- -- -- -- --  CALCIUM 8.7 8.6 8.6 8.8 9.0 8.7 8.6  PHOS -- -- -- -- -- 4.2 4.0   CBC  Lab 03/17/12 0350 03/16/12 0415 03/15/12 0440 03/14/12 1339  WBC 23.1* 27.3* 35.7* 39.3*  NEUTROABS -- -- -- --  HGB 9.1* 8.5* 7.4* 7.7*  HCT 26.9* 25.5* 22.3*  23.5*  MCV 85.9 87.0 87.5 88.0  PLT 240 233 240 233    Medications:       . amiodarone  200 mg Oral Daily  . aspirin EC  325 mg Oral Daily   Or  . aspirin  324 mg Per Tube Daily  . bisacodyl  10 mg Oral Daily   Or  . bisacodyl  10 mg Rectal Daily  . brimonidine  1 drop Left Eye BID  . ceFEPime (MAXIPIME) IV  1 g Intravenous Q24H  . darbepoetin (ARANESP) injection - NON-DIALYSIS  200 mcg Subcutaneous Q Sun-1800  . docusate sodium  200 mg Oral Daily  . dorzolamide-timolol  1 drop Left Eye BID  . erythromycin  1 application Right Eye BID  . ezetimibe  10 mg Oral Daily  . ferric gluconate (FERRLECIT/NULECIT) IV  125 mg Intravenous Daily  . insulin aspart  0-24 Units Subcutaneous Q4H  . insulin aspart  4 Units Subcutaneous TID WC  . insulin glargine  26 Units Subcutaneous BID  . ipratropium  1 spray Nasal BID  . levalbuterol  0.63 mg Nebulization TID  . loteprednol  1 drop Right Eye BID  . metoprolol tartrate  12.5 mg Oral BID   Or  . metoprolol tartrate  12.5 mg Per Tube BID  . pantoprazole  40 mg Oral Q1200  . polyvinyl alcohol  1 drop Both Eyes BID  . DISCONTD: darbepoetin (ARANESP) injection - NON-DIALYSIS  100 mcg Subcutaneous Q Sun-1800  . DISCONTD: metoprolol tartrate  25 mg Per Tube BID  . DISCONTD: metoprolol tartrate  25 mg Oral BID  . DISCONTD: milk and molasses   Rectal Once  . DISCONTD: potassium chloride  20 mEq Oral Daily    Assessment/ Plan:   Pt is a 71 y.o. 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   Acute Kidney Injury - The patient has well documented CKD - diabetic nephropathy with a baseline creatinine of 1.7 prior to admission. He is a patient of Dr. Eliott Nine at [facility], who prescribed the patient ramipril for treatment of his proteinuria. It appears that this medication was stopped prior to his cardiac catheterization, and his acute  kidney injury did not occur until after a week after the catheterization, making contrast nephropathy very unlikely. The patient had a significant retroperitoneal bleed overnight prior to surgery, which seemed to set off the bump in creatinine. This indicates that the blood loss likely caused acute tubular necrosis. Foley replaced 9/25 for oliguria after original removal for infectious concern.   - After initial improvement, Creatinine increasing 2 --> 2.7 --> 3.45 -->  4.2 --> 4.55 - Lasix held and 1 Units of PRBC given 9/26, renal u/s neg for hydronephrosis, FeNa < 1 so supports pre-renal mechanism, likely renal hypoperfusion leading to more ischemic ATN -  continue dopamine to facilitate  - repeat urine culture based on urinalysis  - check diff on CBC to make sure no allergic component   Elevated LFT's - Increase of AST to 338 and ALT to 253 on 9/26 after being normal at admission. Concern for ischemic injury to liver or bowel.  - Improving LFT's, continue to trend w/ CMET tomorrow AM  Fever and Leukocytosis - Started on 9/24; Possible source Foley vs Respiratory and possibly both. Foley d/c'd 9/24; Foley replaced 9/25 22/ oliguria - Urine culture grew pseudomonas - Respiratory cx still pending   - Blood cx pending   Pseudomonas UTI - positive culture 9/24, pan sensitive  - on cefepime   Normocytic Anemia - Likely secondary to acute blood loss from retroperitoneal bleed and blood loss from surgery. It is unlikely that this is related to kidney function, as his Hgb was approximately 12 on the day of admission. Fe < 10. 1 unit PRBC 9/26  - Nulecit 125 mg x 10 days started 9/24, day 5/10  - Added aranesp 9/22, to be given weekly   Constipation - post op ileus, improved with 2 BM 9/25 after milk of molasses enema and stopping PO Fe - continue enema PRN  Electrolytes: - Hyponatremia - from increased ADH   Bone Health - Calcium and phos normal, so no need for supplementation right now.    Cardiology - Patient s/p 4 vessel CABG on 9/16.  - All drips d/c yesterday. Maintaining BP.  - decreased metoprolol to 12.5 mg BID on 9/27  DVT PPX - SCD's  Si Raider. Clinton Sawyer, M.D.  03/17/2012, 6:32 AM    I have seen and examined this patient and agree with the plan of care seen and eval. Vol xs, mild ^ Cr. Seems to be having more urine with inotrope.  If not improving GFR will need dialysis soon.  LFTs better, suspect ischemic bowel .  Ahmarion Saraceno L 03/17/2012, 9:56 AM

## 2012-03-18 LAB — COMPREHENSIVE METABOLIC PANEL
ALT: 112 U/L — ABNORMAL HIGH (ref 0–53)
AST: 47 U/L — ABNORMAL HIGH (ref 0–37)
Albumin: 2.2 g/dL — ABNORMAL LOW (ref 3.5–5.2)
CO2: 22 mEq/L (ref 19–32)
Calcium: 8.6 mg/dL (ref 8.4–10.5)
Creatinine, Ser: 4.29 mg/dL — ABNORMAL HIGH (ref 0.50–1.35)
GFR calc non Af Amer: 12 mL/min — ABNORMAL LOW (ref >90)
Sodium: 131 mEq/L — ABNORMAL LOW (ref 135–145)
Total Protein: 5.6 g/dL — ABNORMAL LOW (ref 6.0–8.3)

## 2012-03-18 LAB — URINE CULTURE
Colony Count: NO GROWTH
Culture: NO GROWTH

## 2012-03-18 LAB — CBC
HCT: 27.1 % — ABNORMAL LOW (ref 39.0–52.0)
Hemoglobin: 9.1 g/dL — ABNORMAL LOW (ref 13.0–17.0)
MCH: 28.9 pg (ref 26.0–34.0)
MCHC: 33.6 g/dL (ref 30.0–36.0)
MCV: 86 fL (ref 78.0–100.0)
Platelets: 246 10*3/uL (ref 150–400)
RBC: 3.15 MIL/uL — ABNORMAL LOW (ref 4.22–5.81)
RDW: 15.6 % — ABNORMAL HIGH (ref 11.5–15.5)
WBC: 19.4 10*3/uL — ABNORMAL HIGH (ref 4.0–10.5)

## 2012-03-18 LAB — GLUCOSE, CAPILLARY
Glucose-Capillary: 61 mg/dL — ABNORMAL LOW (ref 70–99)
Glucose-Capillary: 93 mg/dL (ref 70–99)
Glucose-Capillary: 132 mg/dL — ABNORMAL HIGH (ref 70–99)
Glucose-Capillary: 138 mg/dL — ABNORMAL HIGH (ref 70–99)

## 2012-03-18 MED ORDER — DEXTROSE 50 % IV SOLN
25.0000 mL | Freq: Once | INTRAVENOUS | Status: AC | PRN
Start: 2012-03-18 — End: 2012-03-18
  Administered 2012-03-18: 25 mL via INTRAVENOUS

## 2012-03-18 MED ORDER — INSULIN GLARGINE 100 UNIT/ML ~~LOC~~ SOLN
14.0000 [IU] | Freq: Two times a day (BID) | SUBCUTANEOUS | Status: DC
  Administered 2012-03-18 – 2012-03-26 (×18): 14 [IU] via SUBCUTANEOUS

## 2012-03-18 MED ORDER — DEXTROSE 50 % IV SOLN
INTRAVENOUS | Status: AC
  Administered 2012-03-18: 25 mL via INTRAVENOUS
  Filled 2012-03-18: qty 50

## 2012-03-18 MED ORDER — DEXTROSE 50 % IV SOLN
25.0000 mL | Freq: Once | INTRAVENOUS | Status: AC | PRN
  Administered 2012-03-18: 25 mL via INTRAVENOUS

## 2012-03-18 NOTE — Progress Notes (Signed)
13 Days Post-Op Procedure(s) (LRB): CORONARY ARTERY BYPASS GRAFTING (CABG) (N/A) Subjective Status post CABG, postoperative urosepsis with Klebsiella UTI, postoperative acute renal insufficiency followed by renal Severe deconditioning receiving physical therapy Diabetes with recent hypoglycemic episodes do to decreased oral intake associated with uremia Sinus rhythm Incisions healing   Objective: Vital signs in last 24 hours: Temp:  [97.6 F (36.4 C)-98.1 F (36.7 C)] 98.1 F (36.7 C) (09/29 1512) Pulse Rate:  [76-84] 79  (09/29 1700) Cardiac Rhythm:  [-] Normal sinus rhythm (09/29 0729) Resp:  [13-23] 13  (09/29 1700) BP: (103-128)/(50-70) 112/50 mmHg (09/29 1700) SpO2:  [91 %-96 %] 96 % (09/29 1700) Weight:  [279 lb 5.2 oz (126.7 kg)] 279 lb 5.2 oz (126.7 kg) (09/29 0400)  Hemodynamic parameters for last 24 hours:   stable  Intake/Output from previous day: 09/28 0701 - 09/29 0700 In: 1328 [P.O.:780; I.V.:388; IV Piggyback:160] Out: 2655 [Urine:2655] Intake/Output this shift: Total I/O In: 740 [P.O.:420; I.V.:160; IV Piggyback:160] Out: 1530 [Urine:1530]  Lungs clear Incisions clean dry Extremities warm Moderate pedal edema  Lab Results:  Basename 03/18/12 0400 03/17/12 0350  WBC 19.4* 23.1*  HGB 9.1* 9.1*  HCT 27.1* 26.9*  PLT 246 240   BMET:  Basename 03/18/12 0400 03/17/12 0350  NA 131* 127*  K 4.0 3.8  CL 92* 88*  CO2 22 22  GLUCOSE 51* 86  BUN 132* 126*  CREATININE 4.29* 4.55*  CALCIUM 8.6 8.7    PT/INR: No results found for this basename: LABPROT,INR in the last 72 hours ABG    Component Value Date/Time   PHART 7.318* 03/06/2012 0732   HCO3 22.5 03/06/2012 0732   TCO2 20 03/06/2012 1651   ACIDBASEDEF 3.0* 03/06/2012 0732   O2SAT 95.0 03/06/2012 0732   CBG (last 3)   Basename 03/18/12 1510 03/18/12 1129 03/18/12 0724  GLUCAP 132* 159* 93    Assessment/Plan: S/P Procedure(s) (LRB): CORONARY ARTERY BYPASS GRAFTING (CABG) (N/A) Creatinine  shows improvement a day 4.5 decrease to 4.2, hold off on renal replacement therapy, 2.6 L urine output yesterday Lantus dose adjusted for hypoglycemic episodes Increased oral intake encouraged to patient   LOS: 20 days    VAN TRIGT III,Arhum Peeples 03/18/2012

## 2012-03-18 NOTE — Progress Notes (Signed)
CBG 61, D50 25 cc given IV, repeat CBG 93

## 2012-03-18 NOTE — Progress Notes (Signed)
ANTIBIOTIC CONSULT NOTE - FOLLOW UP  Pharmacy Consult for Cefepime Indication: Pseudomonas UTI  No Known Allergies  Patient Measurements: Height: 5\' 10"  (177.8 cm) Weight: 279 lb 5.2 oz (126.7 kg) IBW/kg (Calculated) : 73   Vital Signs: Temp: 98 F (36.7 C) (09/29 1131) Temp src: Oral (09/29 1131) BP: 128/65 mmHg (09/29 1200) Pulse Rate: 78  (09/29 1200) Intake/Output from previous day: 09/28 0701 - 09/29 0700 In: 1328 [P.O.:780; I.V.:388; IV Piggyback:160] Out: 2655 [Urine:2655] Intake/Output from this shift: Total I/O In: 480 [P.O.:240; I.V.:80; IV Piggyback:160] Out: 880 [Urine:880]  Labs:  Basename 03/18/12 0400 03/17/12 0350 03/16/12 1046 03/16/12 0415  WBC 19.4* 23.1* -- 27.3*  HGB 9.1* 9.1* -- 8.5*  PLT 246 240 -- 233  LABCREA -- -- 107.92 --  CREATININE 4.29* 4.55* -- 4.20*   Estimated Creatinine Clearance: 20.5 ml/min (by C-G formula based on Cr of 4.29). No results found for this basename: VANCOTROUGH:2,VANCOPEAK:2,VANCORANDOM:2,GENTTROUGH:2,GENTPEAK:2,GENTRANDOM:2,TOBRATROUGH:2,TOBRAPEAK:2,TOBRARND:2,AMIKACINPEAK:2,AMIKACINTROU:2,AMIKACIN:2, in the last 72 hours   Microbiology: Recent Results (from the past 720 hour(s))  MRSA PCR SCREENING     Status: Normal   Collection Time   02/28/12  2:44 PM      Component Value Range Status Comment   MRSA by PCR NEGATIVE  NEGATIVE Final   SURGICAL PCR SCREEN     Status: Normal   Collection Time   03/04/12 11:21 PM      Component Value Range Status Comment   MRSA, PCR NEGATIVE  NEGATIVE Final    Staphylococcus aureus NEGATIVE  NEGATIVE Final   URINE CULTURE     Status: Normal   Collection Time   03/13/12 12:11 PM      Component Value Range Status Comment   Specimen Description URINE, CLEAN CATCH   Final    Special Requests NONE   Final    Culture  Setup Time 03/13/2012 20:49   Final    Colony Count >=100,000 COLONIES/ML   Final    Culture PSEUDOMONAS AERUGINOSA   Final    Report Status 03/15/2012 FINAL   Final     Organism ID, Bacteria PSEUDOMONAS AERUGINOSA   Final   CULTURE, BLOOD (SINGLE)     Status: Normal (Preliminary result)   Collection Time   03/13/12  3:09 PM      Component Value Range Status Comment   Specimen Description BLOOD ARM LEFT   Final    Special Requests     Final    Value: BOTTLES DRAWN AEROBIC AND ANAEROBIC 10CC AER 4CC ANA   Culture  Setup Time 03/13/2012 20:13   Final    Culture     Final    Value:        BLOOD CULTURE RECEIVED NO GROWTH TO DATE CULTURE WILL BE HELD FOR 5 DAYS BEFORE ISSUING A FINAL NEGATIVE REPORT   Report Status PENDING   Incomplete   CULTURE, EXPECTORATED SPUTUM-ASSESSMENT     Status: Normal   Collection Time   03/15/12 10:11 AM      Component Value Range Status Comment   Specimen Description SPUTUM   Final    Special Requests NONE   Final    Sputum evaluation     Final    Value: THIS SPECIMEN IS ACCEPTABLE. RESPIRATORY CULTURE REPORT TO FOLLOW.   Report Status 03/15/2012 FINAL   Final   CULTURE, RESPIRATORY     Status: Normal   Collection Time   03/15/12 10:11 AM      Component Value Range Status Comment   Specimen Description  SPUTUM   Final    Special Requests NONE   Final    Gram Stain     Final    Value: MODERATE WBC PRESENT, PREDOMINANTLY PMN     RARE SQUAMOUS EPITHELIAL CELLS PRESENT     FEW GRAM NEGATIVE RODS     FEW GRAM POSITIVE COCCI IN PAIRS     IN CLUSTERS IN CHAINS   Culture NORMAL OROPHARYNGEAL FLORA   Final    Report Status 03/17/2012 FINAL   Final   URINE CULTURE     Status: Normal   Collection Time   03/17/12  8:52 AM      Component Value Range Status Comment   Specimen Description URINE, CATHETERIZED   Final    Special Requests NONE   Final    Culture  Setup Time 03/17/2012 14:36   Final    Colony Count NO GROWTH   Final    Culture NO GROWTH   Final    Report Status 03/18/2012 FINAL   Final     Anti-infectives     Start     Dose/Rate Route Frequency Ordered Stop   03/16/12 1000   ceFEPIme (MAXIPIME) 1 g in dextrose 5  % 50 mL IVPB     Comments: Pharmacy to dose      1 g 100 mL/hr over 30 Minutes Intravenous Every 24 hours 03/15/12 0927     03/15/12 1400   vancomycin (VANCOCIN) 1,500 mg in sodium chloride 0.9 % 500 mL IVPB  Status:  Discontinued        1,500 mg 250 mL/hr over 120 Minutes Intravenous Every 48 hours 03/14/12 1258 03/15/12 0842   03/15/12 0900   ceFEPIme (MAXIPIME) 1 g in dextrose 5 % 50 mL IVPB     Comments: Pharmacy to dose      1 g 100 mL/hr over 30 Minutes Intravenous  Once 03/15/12 0842 03/15/12 1031   03/14/12 1300   vancomycin (VANCOCIN) 750 mg in sodium chloride 0.9 % 150 mL IVPB        750 mg 150 mL/hr over 60 Minutes Intravenous  Once 03/14/12 1257 03/14/12 1456   03/13/12 1200   piperacillin-tazobactam (ZOSYN) IVPB 3.375 g  Status:  Discontinued        3.375 g 12.5 mL/hr over 240 Minutes Intravenous Every 8 hours 03/13/12 1050 03/15/12 0842   03/13/12 1100   vancomycin (VANCOCIN) 750 mg in sodium chloride 0.9 % 150 mL IVPB        750 mg 150 mL/hr over 60 Minutes Intravenous  Once 03/13/12 1050 03/13/12 1359   03/06/12 0000   cefUROXime (ZINACEF) 1.5 g in dextrose 5 % 50 mL IVPB        1.5 g 100 mL/hr over 30 Minutes Intravenous Every 12 hours 03/05/12 1355 03/07/12 0949   03/05/12 2000   vancomycin (VANCOCIN) IVPB 1000 mg/200 mL premix        1,000 mg 200 mL/hr over 60 Minutes Intravenous  Once 03/05/12 1355 03/05/12 2044   03/05/12 0400   vancomycin (VANCOCIN) 1,250 mg in sodium chloride 0.9 % 250 mL IVPB        1,250 mg 166.7 mL/hr over 90 Minutes Intravenous To Surgery 03/04/12 1415 03/05/12 0718   03/05/12 0400   cefUROXime (ZINACEF) 2 g in dextrose 5 % 50 mL IVPB  Status:  Discontinued        2 g 100 mL/hr over 30 Minutes Intravenous To Surgery 03/04/12 1415 03/04/12 1738   03/05/12  0400   cefUROXime (ZINACEF) 750 mg in dextrose 5 % 50 mL IVPB  Status:  Discontinued        750 mg 100 mL/hr over 30 Minutes Intravenous To Surgery 03/04/12 1415 03/05/12 1104     03/05/12 0400   cefUROXime (ZINACEF) 1.5 g in dextrose 5 % 50 mL IVPB        1.5 g 100 mL/hr over 30 Minutes Intravenous To Surgery 03/04/12 1738 03/05/12 1145          Assessment: Pseudomonas UTI - Continues on Day #4 of Cefepime (day #6 of ABX).  Renal function remains impaired but stable.  Goal of Therapy: Eradication of infection  Plan:  Continue Cefepime 1gm IV q24h Monitor renal function and urine output  Estella Husk, Pharm.D., BCPS Clinical Pharmacist  Phone [phone] Pager [phone] 03/18/2012, 12:51 PM

## 2012-03-18 NOTE — Progress Notes (Signed)
Subjective: Interval History: none.  Objective: Vital signs in last 24 hours: Temp:  [97.6 F (36.4 C)-98.4 F (36.9 C)] 97.6 F (36.4 C) (09/29 0711) Pulse Rate:  [73-87] 77  (09/29 0800) Resp:  [13-23] 13  (09/29 0800) BP: (98-128)/(40-70) 125/70 mmHg (09/29 0800) SpO2:  [87 %-97 %] 93 % (09/29 0800) Weight:  [126.7 kg (279 lb 5.2 oz)] 126.7 kg (279 lb 5.2 oz) (09/29 0400) Weight change: -0.7 kg (-1 lb 8.7 oz)  Intake/Output from previous day: 09/28 0701 - 09/29 0700 In: 1328 [P.O.:780; I.V.:388; IV Piggyback:160] Out: 2655 [Urine:2655] Intake/Output this shift: Total I/O In: 16 [I.V.:16] Out: 350 [Urine:350]  General appearance: alert, cooperative, moderately obese and slowed mentation Resp: diminished breath sounds bilaterally and rales bibasilar Cardio: S1, S2 normal and systolic murmur: holosystolic 2/6, blowing at apex GI: obese, mod distension, pos bs, liver down 6 cm Extremities: edema 3+  Lab Results:  Basename 03/18/12 0400 03/17/12 0350  WBC 19.4* 23.1*  HGB 9.1* 9.1*  HCT 27.1* 26.9*  PLT 246 240   BMET:  Basename 03/18/12 0400 03/17/12 0350  NA 131* 127*  K 4.0 3.8  CL 92* 88*  CO2 22 22  GLUCOSE 51* 86  BUN 132* 126*  CREATININE 4.29* 4.55*  CALCIUM 8.6 8.7   No results found for this basename: PTH:2 in the last 72 hours Iron Studies: No results found for this basename: IRON,TIBC,TRANSFERRIN,FERRITIN in the last 72 hours  Studies/Results: US Renal Port  03/16/2012  *RADIOLOGY REPORT*  Clinical Data: Postop renal injury  RENAL/URINARY TRACT ULTRASOUND COMPLETE  Comparison:  CT 03/07/2012  Findings:  Right Kidney:  14.0 cm. Normal size and echotexture.  No focal abnormality.  No hydronephrosis.  Left Kidney:  Difficult to visualize due to the flank hematoma. 12.0 cm.  No hydronephrosis seen.  Otherwise evaluation of the left kidney limited.  Bladder:  Foley catheter place, decompressed.  IMPRESSION: Right kidney unremarkable.  No hydronephrosis on  the left, but evaluation of the left kidney limited due to the left flank hematoma.   Original Report Authenticated By: Cyndie Chime, M.D.    Dg Chest Port 1 View  03/17/2012  *RADIOLOGY REPORT*  Clinical Data: CABG  PORTABLE CHEST - 1 VIEW  Comparison: 03/16/2012  Findings: Layering left pleural effusion.  No frank interstitial edema.  Mild scarring/atelectasis  in the right lower lobe.  No pneumothorax.  Stable mild cardiomegaly.  Postsurgical changes related to prior CABG.  Stable left subclavian venous catheter.  IMPRESSION: Layering left pleural effusion.  Stable cardiomegaly.  No frank interstitial edema.   Original Report Authenticated By: Charline Bills, M.D.     I have reviewed the patient's current medications.  Assessment/Plan: 1 AKI/CKD  Vol xs, Cr stable to better.  Hopefully will turn around, still low GFR.  Mild acidemia 2 Shock liver improving.  ?ischemic bowel 3 DM stable 4 UTI on Cefapine 5 Obesity 6 Anemia on epo/fe P Inotrope, AB follow Cr.    LOS: 20 days   Shelonda Saxe L 03/18/2012,8:17 AM

## 2012-03-18 NOTE — Significant Event (Signed)
Blood Sugar at 0400 52 on finger stick, confirmed with blood draw. Pt asymptomatic.  Per protocol treated with Dextrose 25ml IV, will recheck in 15 minutes.

## 2012-03-19 LAB — GLUCOSE, CAPILLARY
Glucose-Capillary: 138 mg/dL — ABNORMAL HIGH (ref 70–99)
Glucose-Capillary: 100 mg/dL — ABNORMAL HIGH (ref 70–99)
Glucose-Capillary: 127 mg/dL — ABNORMAL HIGH (ref 70–99)
Glucose-Capillary: 176 mg/dL — ABNORMAL HIGH (ref 70–99)
Glucose-Capillary: 214 mg/dL — ABNORMAL HIGH (ref 70–99)

## 2012-03-19 LAB — CBC
Hemoglobin: 9.5 g/dL — ABNORMAL LOW (ref 13.0–17.0)
MCH: 29.5 pg (ref 26.0–34.0)
MCHC: 33.7 g/dL (ref 30.0–36.0)

## 2012-03-19 LAB — PHOSPHORUS: Phosphorus: 6.9 mg/dL — ABNORMAL HIGH (ref 2.3–4.6)

## 2012-03-19 LAB — COMPREHENSIVE METABOLIC PANEL
AST: 31 U/L (ref 0–37)
CO2: 24 mEq/L (ref 19–32)
Calcium: 8.9 mg/dL (ref 8.4–10.5)
Creatinine, Ser: 3.71 mg/dL — ABNORMAL HIGH (ref 0.50–1.35)
GFR calc Af Amer: 17 mL/min — ABNORMAL LOW (ref >90)
GFR calc non Af Amer: 15 mL/min — ABNORMAL LOW (ref >90)
Glucose, Bld: 116 mg/dL — ABNORMAL HIGH (ref 70–99)

## 2012-03-19 LAB — CULTURE, BLOOD (SINGLE): Culture: NO GROWTH

## 2012-03-19 MED ORDER — LEVALBUTEROL HCL 0.63 MG/3ML IN NEBU
0.6300 mg | INHALATION_SOLUTION | Freq: Two times a day (BID) | RESPIRATORY_TRACT | Status: DC
  Filled 2012-03-19 (×3): qty 3

## 2012-03-19 MED ORDER — GLUCERNA SHAKE PO LIQD
237.0000 mL | Freq: Two times a day (BID) | ORAL | Status: DC
  Administered 2012-03-20 – 2012-03-27 (×12): 237 mL via ORAL

## 2012-03-19 NOTE — Progress Notes (Signed)
Patient ID: DAIKI DICOSTANZO, male   DOB: October 14, 1940, 71 y.o.   MRN: [id number]                   [address]            [place] [address]          [phone]     14 Days Post-Op Procedure(s) (LRB): CORONARY ARTERY BYPASS GRAFTING (CABG) (N/A)  Total Length of Stay:  LOS: 21 days  BP 111/55  Pulse 82  Temp 97.5 F (36.4 C) (Oral)  Resp 16  Ht 5\' 10"  (1.778 m)  Wt 272 lb 14.9 oz (123.8 kg)  BMI 39.16 kg/m2  SpO2 94%     . sodium chloride 10 mL/hr at 03/19/12 1900  . DOPamine 2.498 mcg/kg/min (03/19/12 1900)     Lab Results  Component Value Date   WBC 13.6* 03/19/2012   HGB 9.5* 03/19/2012   HCT 28.2* 03/19/2012   PLT 233 03/19/2012   GLUCOSE 116* 03/19/2012   ALT 83* 03/19/2012   AST 31 03/19/2012   NA 132* 03/19/2012   K 3.8 03/19/2012   CL 94* 03/19/2012   CREATININE 3.71* 03/19/2012   BUN 125* 03/19/2012   CO2 24 03/19/2012   TSH 4.185 03/15/2012   INR 1.47 03/05/2012   Stable day  [name] MD  Beeper [phone] Office [phone] 03/19/2012 8:04 PM

## 2012-03-19 NOTE — Progress Notes (Signed)
Patient ID: [name], male   DOB: Oct 03, 1940, 71 y.o.   MRN: [id number] TCTS DAILY PROGRESS NOTE                   [address]            [place] [address]          [phone]      14 Days Post-Op Procedure(s) (LRB): CORONARY ARTERY BYPASS GRAFTING (CABG) (N/A)  Total Length of Stay:  LOS: 21 days   Subjective: No very talkative this morning  Objective: Vital signs in last 24 hours: Temp:  [97.6 F (36.4 C)-98.6 F (37 C)] 97.6 F (36.4 C) (09/30 0734) Pulse Rate:  [73-82] 76  (09/30 0700) Cardiac Rhythm:  [-] Normal sinus rhythm (09/30 0700) Resp:  [13-21] 20  (09/30 0700) BP: (84-139)/(43-72) 114/57 mmHg (09/30 0700) SpO2:  [93 %-98 %] 96 % (09/30 0700) Weight:  [272 lb 14.9 oz (123.8 kg)] 272 lb 14.9 oz (123.8 kg) (09/30 0300)  Filed Weights   03/17/12 0600 03/18/12 0400 03/19/12 0300  Weight: 282 lb 10.1 oz (128.2 kg) 279 lb 5.2 oz (126.7 kg) 272 lb 14.9 oz (123.8 kg)    Weight change: -6 lb 6.3 oz (-2.9 kg)   Hemodynamic parameters for last 24 hours:    Intake/Output from previous day: 09/29 0701 - 09/30 0700 In: 1264 [P.O.:720; I.V.:384; IV Piggyback:160] Out: 3060 [Urine:3060]  Intake/Output this shift:    Current Meds: Scheduled Meds:   . amiodarone  200 mg Oral Daily  . aspirin EC  325 mg Oral Daily   Or  . aspirin  324 mg Per Tube Daily  . bisacodyl  10 mg Oral Daily   Or  . bisacodyl  10 mg Rectal Daily  . brimonidine  1 drop Left Eye BID  . ceFEPime (MAXIPIME) IV  1 g Intravenous Q24H  . darbepoetin (ARANESP) injection - NON-DIALYSIS  200 mcg Subcutaneous Q Sun-1800  . docusate sodium  200 mg Oral Daily  . dorzolamide-timolol  1 drop Left Eye BID  . erythromycin  1 application Right Eye BID  . ezetimibe  10 mg Oral Daily  . ferric gluconate (FERRLECIT/NULECIT) IV  125 mg Intravenous Daily  . insulin aspart  0-24 Units Subcutaneous Q4H  . insulin aspart  4 Units Subcutaneous TID WC  . insulin glargine  14 Units  Subcutaneous BID  . ipratropium  1 spray Nasal BID  . levalbuterol  0.63 mg Nebulization TID  . loteprednol  1 drop Right Eye BID  . metoprolol tartrate  12.5 mg Oral BID   Or  . metoprolol tartrate  12.5 mg Per Tube BID  . pantoprazole  40 mg Oral Q1200  . polyvinyl alcohol  1 drop Both Eyes BID  . DISCONTD: insulin glargine  26 Units Subcutaneous BID   Continuous Infusions:   . sodium chloride 14 mL/hr at 03/16/12 1014  . DOPamine 2.5 mcg/kg/min (03/18/12 1148)   PRN Meds:.albuterol, lactulose, metoprolol, ondansetron (ZOFRAN) IV, oxyCODONE, sodium chloride  General appearance: fatigued, no distress and slowed mentation Neurologic: intact Heart: regular rate and rhythm, S1, S2 normal, no murmur, click, rub or gallop and normal apical impulse Lungs: diminished breath sounds bibasilar Abdomen: soft, non-tender; bowel sounds normal; no masses,  no organomegaly Extremities: extremities normal, atraumatic, no cyanosis or edema, Homans sign is negative, no sign of DVT and leg incisions improved Wound: sternum stable without infection  Lab Results: CBC: Basename 03/19/12 0357  03/18/12 0400  WBC 13.6* 19.4*  HGB 9.5* 9.1*  HCT 28.2* 27.1*  PLT 233 246   BMET:  Basename 03/19/12 0357 03/18/12 0400  NA 132* 131*  K 3.8 4.0  CL 94* 92*  CO2 24 22  GLUCOSE 116* 51*  BUN 125* 132*  CREATININE 3.71* 4.29*  CALCIUM 8.9 8.6    PT/INR: No results found for this basename: LABPROT,INR in the last 72 hours Radiology: Dg Chest Port 1 View  03/17/2012  *RADIOLOGY REPORT*  Clinical Data: CABG  PORTABLE CHEST - 1 VIEW  Comparison: 03/16/2012  Findings: Layering left pleural effusion.  No frank interstitial edema.  Mild scarring/atelectasis  in the right lower lobe.  No pneumothorax.  Stable mild cardiomegaly.  Postsurgical changes related to prior CABG.  Stable left subclavian venous catheter.  IMPRESSION: Layering left pleural effusion.  Stable cardiomegaly.  No frank interstitial edema.    Original Report Authenticated By: Charline Bills, M.D.      Assessment/Plan: S/P Procedure(s) (LRB): CORONARY ARTERY BYPASS GRAFTING (CABG) (N/A) Mobilize Diabetes control slight improvement in BUN/Cr WBC decreased siginiciantly Continue supportive care. Will need long term nursing care, [facility] down [facility]   [name] MD  Beeper [phone] Office [phone] 03/19/2012 7:48 AM

## 2012-03-19 NOTE — Progress Notes (Signed)
Physical Therapy Treatment Patient Details Name: [name] MRN: [id number] DOB: 1940/11/17 Today's Date: 03/19/2012 Time: 9147-8295 PT Time Calculation (min): 23 min  PT Assessment / Plan / Recommendation Comments on Treatment Session  Pt s/p CABG.  Pt continues to make very little progress.  Unable to stand from bed.  Used maxisky to get to chair.    Follow Up Recommendations  Post acute inpatient rehab    Barriers to Discharge        Equipment Recommendations  Other (comment) (To be determined if pt. discharges to home)    Recommendations for Other Services    Frequency Min 2X/week   Plan Discharge plan remains appropriate;Frequency remains appropriate    Precautions / Restrictions Precautions Precautions: Sternal;Fall Precaution Comments: Pt. severely HOH so unable to gauge how well he understands/comprehends precuations   Pertinent Vitals/Pain Abdominal pain with sitting EOB    Mobility  Bed Mobility Bed Mobility: Supine to Sit;Sitting - Scoot to Edge of Bed;Sit to Supine Supine to Sit: 1: +2 Total assist Supine to Sit: Patient Percentage: 20% Sitting - Scoot to Edge of Bed: 1: +2 Total assist Sitting - Scoot to Edge of Bed: Patient Percentage: 20% Sit to Supine: 1: +2 Total assist Sit to Supine: Patient Percentage: 10% Details for Bed Mobility Assistance: Used bed pad to bring legs and hips to edge of bed. Transfers Transfer via Scientist, water quality Details for Transfer Assistance: Attempted to stand x1; however, pt. unable to clear hips from bed with totat A +2.  Pt sat EOB x 5-6 mins with min A, before insisting on returning to supine due to abdominal pain.  Pt. moved to recliner via maxi sky lift    Exercises     PT Diagnosis:    PT Problem List:   PT Treatment Interventions:     PT Goals Acute Rehab PT Goals PT Goal Formulation: With patient Time For Goal Achievement: 04/02/12 Potential to Achieve Goals: Fair Pt will go Supine/Side to Sit: with  +2 total assist (pt=50%) PT Goal: Supine/Side to Sit - Progress: Revised due to lack of progress Pt will Sit at [facility] of Bed: 6-10 min;with supervision PT Goal: Sit at [facility] Of Bed - Progress: Goal set today Pt will go Sit to Supine/Side: with +2 total assist (pt=50%) PT Goal: Sit to Supine/Side - Progress: Revised due to lack of progress Pt will go Sit to Stand: with +2 total assist (pt=50%) PT Goal: Sit to Stand - Progress: Revised due to lack of progress Pt will go Stand to Sit: with +2 total assist (pt=50%) PT Goal: Stand to Sit - Progress: Revised due to lack of progress PT Transfer Goal: Bed to Chair/Chair to Bed - Progress: Discontinued (comment) Pt will Stand: with +2 total assist (pt=70%) PT Goal: Stand - Progress: Goal set today PT Goal: Ambulate - Progress: Discontinued (comment) (lack of progress)  Visit Information  Last PT Received On: 03/19/12 Assistance Needed: +2 PT/OT Co-Evaluation/Treatment: Yes    Subjective Data  Subjective: "I need to go back." pt stated when sitting EOB.   Cognition  Overall Cognitive Status: Difficult to assess Difficult to assess due to: Hard of hearing/deaf;Other (comment) (Pt Blind Rt. eye) Arousal/Alertness: Awake/alert Behavior During Session: WFL for tasks performed Cognition - Other Comments: Difficulty following commands due to [facility]    Balance  Balance Balance Assessed: Yes Static Sitting Balance Static Sitting - Balance Support: Bilateral upper extremity supported Static Sitting - Level of Assistance: 4: Min assist Static Sitting -  Comment/# of Minutes: 5-6  End of Session PT - End of Session Activity Tolerance: Patient limited by fatigue Patient left: in chair;with call bell/phone within reach;with nursing in room Nurse Communication: Mobility status;Need for lift equipment   GP     Shamon Lobo 03/19/2012, 11:45 AM  [facility] PT [phone]

## 2012-03-19 NOTE — Progress Notes (Signed)
Occupational Therapy Treatment Patient Details Name: [name] MRN: [id number] DOB: July 26, 1940 Today's Date: 03/19/2012 Time: 1324-4010 OT Time Calculation (min): 23 min  OT Assessment / Plan / Recommendation Comments on Treatment Session Pt. with limited activity this date due to fatigue and abdominal pain.  Pt. lifted to recliner via maxi sky.  Pt. unable to tolerate sit to stand.    Follow Up Recommendations  Skilled nursing facility    Barriers to Discharge       Equipment Recommendations  Other (comment) (To be determined if pt. discharges to home)    Recommendations for Other Services    Frequency Min 2X/week   Plan Discharge plan remains appropriate    Precautions / Restrictions Precautions Precautions: Sternal;Fall Precaution Comments: Pt. severely HOH so unable to gauge how well he understands/comprehends precuations   Pertinent Vitals/Pain     ADL  ADL Comments: Pt. moved to EOB with total A + 2, Pt. tolerated EOB sitting x 5-6 mins. before insisting on returning to supine due to abdominal pain.  Pt. then transferred to chair using maxi move    OT Diagnosis:    OT Problem List:   OT Treatment Interventions:     OT Goals    Visit Information  Last OT Received On: 03/19/12 Assistance Needed: +2 PT/OT Co-Evaluation/Treatment: Yes    Subjective Data      Prior Functioning       Cognition  Overall Cognitive Status: Difficult to assess Difficult to assess due to: Hard of hearing/deaf;Other (comment) (Pt Blind Rt. eye) Arousal/Alertness: Awake/alert Behavior During Session: WFL for tasks performed Cognition - Other Comments: Difficulty following commands due to [facility]    Mobility  Shoulder Instructions Bed Mobility Bed Mobility: Supine to Sit;Sitting - Scoot to Edge of Bed;Sit to Supine Supine to Sit: 1: +2 Total assist Supine to Sit: Patient Percentage: 20% Sitting - Scoot to Edge of Bed: 1: +2 Total assist Sitting - Scoot to Edge of Bed: Patient  Percentage: 20% Sit to Supine: 1: +2 Total assist Sit to Supine: Patient Percentage: 10% Details for Bed Mobility Assistance: Used bed pad to bring legs and hips to edge of bed. Transfers Details for Transfer Assistance: Attempted to stand x1; however, pt. unable to clear hips from bed with totat A +2.  Pt sat EOB x 5-6 mins with min A, before insisting on returning to supine due to abdominal pain.  Pt. moved to recliner via maxi sky lift       Exercises      Balance Balance Balance Assessed: Yes Static Sitting Balance Static Sitting - Balance Support: Bilateral upper extremity supported Static Sitting - Level of Assistance: 4: Min assist Static Sitting - Comment/# of Minutes: 5-6   End of Session OT - End of Session Equipment Utilized During Treatment: Other (comment) (maxi sky) Activity Tolerance: Patient limited by fatigue;Patient limited by pain (denied pain at end of session) Patient left: in chair;with call bell/phone within reach;with nursing in room Nurse Communication: Mobility status  GO     [name] 03/19/2012, 11:28 AM

## 2012-03-19 NOTE — Progress Notes (Signed)
Clinical Social Work-CSW contacted wife to check in regarding first choices of SNF-CSW has sent updated information to [employer] insurance has dropped initial request for authorization and CSW will need to re-initiate auth-CSW will request auth when it is clear that pt is for d/c- CSW has secured  multiple bed offers for pt-Continuing to follow-  Bonnye Fava MSW, [phone]

## 2012-03-19 NOTE — Progress Notes (Signed)
I have personally seen and examined this patient and agree with the assessment/plan as outlined above by Clinton Sawyer M.D. (PGY 2 resident). [name] renal function continues to show slow and gradual recovery with fair urine output of diuretic therapy, current electrolytes appear to be within acceptable limits without need for supplementation/dialysis intervention. We'll continue to follow closely with you. Anticipate BUN to show decline as renal function continues to improve (likely high protein load/resorption from recent retroperitoneal hematoma). [name] K.,MD 03/19/2012 10:59 AM

## 2012-03-19 NOTE — Progress Notes (Signed)
INITIAL ADULT NUTRITION ASSESSMENT Date: 03/19/2012   Time: 12:16 PM  INTERVENTION:  Recommend Carbohydrate Modified High Calorie diet  Glucerna Shake twice daily between meals (220 kcals, 9.9 gm protein per 8 fl oz bottle) RD to follow for nutrition care plan  Reason for Assessment: poor PO intake, ICU  ASSESSMENT: Male 71 y.o.  Dx: s/p CABG  Hx:  Past Medical History  Diagnosis Date  . Myocardial infarction   . Shortness of breath   . Chronic kidney disease     stage 3  . Hypertension   . Depression   . COPD (chronic obstructive pulmonary disease)   . Cardiomyopathy   . GERD (gastroesophageal reflux disease)   . Headache   . Arthritis   . Lipoma   . Hearing loss   . Gastroparesis   . Hemorrhoids   . Colon polyps   . Diabetes mellitus     insulin dependent with neuropathy, nephropathy, retinopathy  . Blindness of right eye   . Coronary artery disease     NSTEMI 2006 with PCI DES left circ 11/18/2004 with residual 50-60% diagonal, 60% LAD, 40% distal RCA  . CHF (congestive heart failure)     mild ischemic DCM EF 40-45% by cath 2006  . Obesity     Related Meds:     . amiodarone  200 mg Oral Daily  . aspirin EC  325 mg Oral Daily   Or  . aspirin  324 mg Per Tube Daily  . bisacodyl  10 mg Oral Daily   Or  . bisacodyl  10 mg Rectal Daily  . brimonidine  1 drop Left Eye BID  . ceFEPime (MAXIPIME) IV  1 g Intravenous Q24H  . darbepoetin (ARANESP) injection - NON-DIALYSIS  200 mcg Subcutaneous Q Sun-1800  . docusate sodium  200 mg Oral Daily  . dorzolamide-timolol  1 drop Left Eye BID  . erythromycin  1 application Right Eye BID  . ezetimibe  10 mg Oral Daily  . ferric gluconate (FERRLECIT/NULECIT) IV  125 mg Intravenous Daily  . insulin aspart  0-24 Units Subcutaneous Q4H  . insulin aspart  4 Units Subcutaneous TID WC  . insulin glargine  14 Units Subcutaneous BID  . ipratropium  1 spray Nasal BID  . levalbuterol  0.63 mg Nebulization TID  . loteprednol  1  drop Right Eye BID  . metoprolol tartrate  12.5 mg Oral BID   Or  . metoprolol tartrate  12.5 mg Per Tube BID  . pantoprazole  40 mg Oral Q1200  . polyvinyl alcohol  1 drop Both Eyes BID    Ht: 5\' 10"  (177.8 cm)  Wt: 272 lb 14.9 oz (123.8 kg)  Ideal Wt: 75.4 kg % Ideal Wt: 164%  Usual Wt: unable to obtain % Usual Wt: ---  Body mass index is 39.16 kg/(m^2).  Food/Nutrition Related Hx: no triggers per admission nutrition screen  Labs:  CMP     Component Value Date/Time   NA 132* 03/19/2012 0357   K 3.8 03/19/2012 0357   CL 94* 03/19/2012 0357   CO2 24 03/19/2012 0357   GLUCOSE 116* 03/19/2012 0357   BUN 125* 03/19/2012 0357   CREATININE 3.71* 03/19/2012 0357   CALCIUM 8.9 03/19/2012 0357   PROT 5.6* 03/19/2012 0357   ALBUMIN 2.1* 03/19/2012 0357   AST 31 03/19/2012 0357   ALT 83* 03/19/2012 0357   ALKPHOS 127* 03/19/2012 0357   BILITOT 0.9 03/19/2012 0357   GFRNONAA 15* 03/19/2012 0357  GFRAA 17* 03/19/2012 0357     Intake/Output Summary (Last 24 hours) at 03/19/12 1217 Last data filed at 03/19/12 1200  Gross per 24 hour  Intake    914 ml  Output   3880 ml  Net  -2966 ml    CBG (last 3)   Basename 03/19/12 0732 03/19/12 0337 03/18/12 2327  GLUCAP 100* 113* 138*    Diet Order: Carb Control  Supplements/Tube Feeding: N/A  IVF:    sodium chloride Last Rate: 10 mL/hr at 03/19/12 1200  DOPamine Last Rate: 2.498 mcg/kg/min (03/19/12 1200)    Estimated Nutritional Needs:   Kcal: 2100-2300 Protein: 110-120 gm Fluid: 2.1-2.3 L  Patient s/p CABG 9/18; Nutrition Brief Note documented 9/20 -- patient was eating well (100%) at that time; PO intake has declined significantly to 20-40% per flowsheet records; patient somewhat confused upon RD visitation; noted per PA-C note, patient sad and depressed ; 1+ pitting edema of hands, feet, hips & sacrum; would benefit from supplementation -- RD to order.  NUTRITION DIAGNOSIS: -Inadequate oral intake (NI-2.1).  Status:  Ongoing  RELATED TO: decreased appetite, depression  AS EVIDENCE BY: PO intake 20-40%  MONITORING/EVALUATION(Goals): Goal: Oral intake with meals & supplements to meet >/= 90% of estimated nutrition needs Monitor: PO & supplemental intake, weight, labs, I/O's  EDUCATION NEEDS: -No education needs identified at this time  Dietitian #: [phone]  DOCUMENTATION CODES Per approved criteria  -Obesity Unspecified    Alger Memos 03/19/2012, 12:16 PM

## 2012-03-19 NOTE — Progress Notes (Signed)
TCTS DAILY PROGRESS NOTE                   [address]            [place] [address]          [phone]      14 Days Post-Op Procedure(s) (LRB): CORONARY ARTERY BYPASS GRAFTING (CABG) (N/A)  Total Length of Stay:  LOS: 21 days   Subjective: Patient states he is depressed and sad.  Objective: Vital signs in last 24 hours: Temp:  [97.6 F (36.4 C)-98.6 F (37 C)] 98.6 F (37 C) (09/30 0400) Pulse Rate:  [73-82] 76  (09/30 0700) Cardiac Rhythm:  [-] Normal sinus rhythm (09/30 0700) Resp:  [13-21] 20  (09/30 0700) BP: (84-139)/(43-72) 114/57 mmHg (09/30 0700) SpO2:  [93 %-98 %] 96 % (09/30 0700) Weight:  [272 lb 14.9 oz (123.8 kg)] 272 lb 14.9 oz (123.8 kg) (09/30 0300)  Filed Weights   03/17/12 0600 03/18/12 0400 03/19/12 0300  Weight: 282 lb 10.1 oz (128.2 kg) 279 lb 5.2 oz (126.7 kg) 272 lb 14.9 oz (123.8 kg)   Weight change: -6 lb 6.3 oz (-2.9 kg)     Intake/Output from previous day: 09/29 0701 - 09/30 0700 In: 1264 [P.O.:720; I.V.:384; IV Piggyback:160] Out: 3060 [Urine:3060]   Current Meds: Scheduled Meds:    . amiodarone  200 mg Oral Q12H   Followed by  . amiodarone  200 mg Oral Daily  . aspirin EC  325 mg Oral Daily   Or  . aspirin  324 mg Per Tube Daily  . atorvastatin  80 mg Oral q1800  . bisacodyl  10 mg Oral Daily   Or  . bisacodyl  10 mg Rectal Daily  . brimonidine  1 drop Left Eye BID  . darbepoetin (ARANESP) injection - NON-DIALYSIS  100 mcg Subcutaneous Q Sun-1800  . docusate sodium  200 mg Oral Daily  . dorzolamide-timolol  1 drop Left Eye BID  . erythromycin  1 application Right Eye BID  . ezetimibe  10 mg Oral Daily  . ferrous sulfate  325 mg Oral Q breakfast  . furosemide  160 mg Oral BID  . insulin aspart  0-24 Units Subcutaneous Q4H  . insulin aspart  4 Units Subcutaneous TID WC  . insulin glargine  22 Units Subcutaneous BID  . ipratropium  1 spray Nasal BID  . levalbuterol  0.63 mg Nebulization TID  .  loteprednol  1 drop Right Eye BID  . metoprolol tartrate  25 mg Oral BID   Or  . metoprolol tartrate  25 mg Per Tube BID  . pantoprazole  40 mg Oral Q1200  . polyvinyl alcohol  1 drop Both Eyes BID  . potassium chloride  20 mEq Oral Daily   Continuous Infusions:    . sodium chloride 40 mL/hr at 03/09/12 0400   PRN Meds:.albuterol, dextrose, lactulose, metoprolol, ondansetron (ZOFRAN) IV, oxyCODONE, sodium chloride   Neurologic: intact Heart: regular rate and rhythm Lungs: diminished breath sounds at bilateral bases  Abdomen: Soft, distended, non tender, occasional bowel sounds, tympanic Extremities: 2++ edema bilaterally;ecchymosis LLE  Wounds: mostly clean and dry;left thigh wound dehisced-no signs of infection   Lab Results: CBC:  Basename 03/19/12 0357 03/18/12 0400  WBC 13.6* 19.4*  HGB 9.5* 9.1*  HCT 28.2* 27.1*  PLT 233 246   BMET:   Basename 03/19/12 0357 03/18/12 0400  NA 132* 131*  K 3.8 4.0  CL 94* 92*  CO2 24 22  GLUCOSE 116* 51*  BUN 125* 132*  CREATININE 3.71* 4.29*  CALCIUM 8.9 8.6    PT/INR: No results found for this basename: LABPROT,INR in the last 72 hours  Radiology: Dg Chest Port 1 View  03/14/2012  *RADIOLOGY REPORT*  Clinical Data: Postoperative radiograph  PORTABLE CHEST - 1 VIEW  Comparison: 03/13/2012  Findings: Left subclavian central venous catheter with tip projecting over the mid SVC.  Cardiomegaly.  Retrocardiac opacity and pleural effusion on the left.  Right lung remains predominately clear.  No pneumothorax.  No interval osseous change. Status post median sternotomy and CABG.  IMPRESSION: No significant interval change. Retrocardiac opacity and small pleural effusion.   Original Report Authenticated By: Waneta Martins, M.D.    Assessment/Plan: S/P Procedure(s) (LRB): CORONARY ARTERY BYPASS GRAFTING (CABG) (N/A)  1.CV-Previous PAF. Maintaining SR.Continue Lopressor 12.5 bid, Amiodarone 200 daily.On Dopamine gttp as well. 2.  Pulmonary- Encourage incentive spirometer and flutter valve. Xopenex TID. 3.DM-CBGs 138/138/113.Continue Insulin. 4.ABL anemia- H and H 9.5 and 28.2 this am.Continue Ferric gluconate. 6.CKD-Creatinine 3.71 this am (baseline around 1.7).Per nephrology. 8.Volume overload-per nephrology 9.Consider anti depressant 10.Leukocytosis-WBC decreased from 19.4 to 11.6.Remains afebrile. On Maxipime.UC  Psueodmonas Aeruginosa. 12.Wife inquiring if he is able to get the flu vaccine.  Doree Fudge PA-C 03/19/2012 7:06 AM

## 2012-03-19 NOTE — Progress Notes (Signed)
[facility] Progress Note    Subjective:   No events overnight, patient eating breakfast this AM with no complaints   Objective:   BP 139/54  Pulse 82  Temp 98.6 F (37 C) (Oral)  Resp 18  Ht 5\' 10"  (1.778 m)  Wt 272 lb 14.9 oz (123.8 kg)  BMI 39.16 kg/m2  SpO2 96%  Intake/Output Summary (Last 24 hours) at 03/19/12 0703 Last data filed at 03/19/12 0600  Gross per 24 hour  Intake   1248 ml  Output   3060 ml  Net  -1812 ml   Weight change: -6 lb 6.3 oz (-2.9 kg)  Temp:  [97.6 F (36.4 C)-98.6 F (37 C)] 98.6 F (37 C) (09/30 0400) Pulse Rate:  [73-82] 82  (09/30 0600) Resp:  [13-21] 18  (09/30 0600) BP: (84-139)/(43-72) 139/54 mmHg (09/30 0600) SpO2:  [93 %-98 %] 96 % (09/30 0600) Weight:  [272 lb 14.9 oz (123.8 kg)] 272 lb 14.9 oz (123.8 kg) (09/30 0300)   Physical Exam: General: VSS, Elderly WM, obese body habitus. Sitting up in chair, NAD Head: Normocephalic, atraumatic. Hearing aid in left ear Eyes: Right eye blindness with enucleation Neck: very short, thick neck, no access  Lungs: Poor respiratory effort, decreased breath sounds in lower lobes bilaterally but no wheezes appreciated Chest Central midline incision healing well Heart: Irregular rhythm. Distant heart sounds  Abdomen: Protuberant, distended, non-tender, hypoactive bowel sounds   Extremities: 1+ pitting edema of hands, 1+ pitting edema of feet, 3+ edema of hips and sacrum. Ecchymosis of thighs nears incision sights and right inguinal area  Neurologic: Decreased hearing; good grip strength. Able to follow commands. Skin: ecchymoses throughout posterior lower extremities and back Genital: purulent drainage from around Foley   Imaging: Dg Chest Port 1 View  03/17/2012  *RADIOLOGY REPORT*  Clinical Data: CABG  PORTABLE CHEST - 1 VIEW  Comparison: 03/16/2012  Findings: Layering left pleural effusion.  No frank interstitial edema.  Mild scarring/atelectasis  in the right lower lobe.  No  pneumothorax.  Stable mild cardiomegaly.  Postsurgical changes related to prior CABG.  Stable left subclavian venous catheter.  IMPRESSION: Layering left pleural effusion.  Stable cardiomegaly.  No frank interstitial edema.   Original Report Authenticated By: Charline Bills, M.D.     Labs: BMET  Lab 03/19/12 4098 03/18/12 0400 03/17/12 0350 03/16/12 0415 03/15/12 0440 03/14/12 0415 03/13/12 0420  NA 132* 131* 127* 127* 130* 130* 132*  K 3.8 4.0 3.8 4.1 3.6 3.6 3.8  CL 94* 92* 88* 89* 92* 92* 93*  CO2 24 22 22 23 24 24 26   GLUCOSE 116* 51* 86 80 130* 179* 152*  BUN 125* 132* 126* 113* 104* 90* 78*  CREATININE 3.71* 4.29* 4.55* 4.20* 3.45* 2.77* 2.34*  ALB -- -- -- -- -- -- --  CALCIUM 8.9 8.6 8.7 8.6 8.6 8.8 9.0  PHOS 6.9* -- -- -- -- -- --   CBC  Lab 03/19/12 0357 03/18/12 0400 03/17/12 0350 03/16/12 0415  WBC 13.6* 19.4* 23.1* 27.3*  NEUTROABS -- -- 20.3* --  HGB 9.5* 9.1* 9.1* 8.5*  HCT 28.2* 27.1* 26.9* 25.5*  MCV 87.6 86.0 85.9 87.0  PLT 233 246 240 233    Medications:       . amiodarone  200 mg Oral Daily  . aspirin EC  325 mg Oral Daily   Or  . aspirin  324 mg Per Tube Daily  . bisacodyl  10 mg Oral Daily   Or  .  bisacodyl  10 mg Rectal Daily  . brimonidine  1 drop Left Eye BID  . ceFEPime (MAXIPIME) IV  1 g Intravenous Q24H  . darbepoetin (ARANESP) injection - NON-DIALYSIS  200 mcg Subcutaneous Q Sun-1800  . docusate sodium  200 mg Oral Daily  . dorzolamide-timolol  1 drop Left Eye BID  . erythromycin  1 application Right Eye BID  . ezetimibe  10 mg Oral Daily  . ferric gluconate (FERRLECIT/NULECIT) IV  125 mg Intravenous Daily  . insulin aspart  0-24 Units Subcutaneous Q4H  . insulin aspart  4 Units Subcutaneous TID WC  . insulin glargine  14 Units Subcutaneous BID  . ipratropium  1 spray Nasal BID  . levalbuterol  0.63 mg Nebulization TID  . loteprednol  1 drop Right Eye BID  . metoprolol tartrate  12.5 mg Oral BID   Or  . metoprolol tartrate  12.5  mg Per Tube BID  . pantoprazole  40 mg Oral Q1200  . polyvinyl alcohol  1 drop Both Eyes BID  . DISCONTD: insulin glargine  26 Units Subcutaneous BID    Assessment/ Plan:   Pt is a 71 y.o. yo male with baseline CKD stage III, COPD, DM2, and CAD s/p CABG on 03/05/12, who is experiencing post-operative acute on chronic kidney injury.He then developed post-operative urosepsis from klebsiella.   Acute Kidney Injury - The patient has well documented CKD - diabetic nephropathy with a baseline creatinine of 1.7 prior to admission. He is a patient of Dr. Eliott Nine at [facility], who prescribed the patient ramipril for treatment of his proteinuria. It appears that this medication was stopped prior to his cardiac catheterization, and his acute kidney injury did not occur until a week after the catheterization, making contrast nephropathy very unlikely. The patient had a significant retroperitoneal bleed overnight prior to surgery, which seemed to set off the bump in creatinine. This indicates that the blood loss likely caused acute tubular necrosis. Foley replaced 9/25 for oliguria after original removal for infectious concern. FeNa < 1.   - Creatinine, GFR starting to improve with marked increase in UPO - so auto-diuresis  2 --> 2.7 --> 3.45 -->  4.2 --> 4.55-->4.29-->3.7 - improving renal function, no need for diuretics, recommend continuing dopamine   Elevated LFT's - Increase of AST to 338 and ALT to 253 on 9/26 after being normal at admission. Concern for ischemic injury to liver or bowel.  - resolved  Pseudomonas UTI - positive culture 9/24, pan sensitive  - on cefepime - Respiratory cx still pending   - Blood cx pending    Normocytic Anemia - Likely secondary to acute blood loss from retroperitoneal bleed and blood loss from surgery. It is unlikely that this is related to kidney function, as his Hgb was approximately 12 on the day of admission. Fe < 10. 1 unit PRBC 9/26  - Nulecit 125  mg x 10 days started 9/24, day 7/10  - Added aranesp 9/22, to be given weekly   Electrolytes: - Hyponatremia - from increased ADH   Bone Health - calcium normal with high phosphate to 6.9 today - start calcium acetate for elevated phos   Cardiology - Patient s/p 4 vessel CABG on 9/16.  - All drips d/c yesterday. Maintaining BP.  - decreased metoprolol to 12.5 mg BID on 9/27 - still on dopamine drip   DVT PPX - SCD's  Si Raider. Clinton Sawyer, M.D.  03/19/2012, 7:03 AM

## 2012-03-20 LAB — GLUCOSE, CAPILLARY
Glucose-Capillary: 148 mg/dL — ABNORMAL HIGH (ref 70–99)
Glucose-Capillary: 130 mg/dL — ABNORMAL HIGH (ref 70–99)
Glucose-Capillary: 173 mg/dL — ABNORMAL HIGH (ref 70–99)

## 2012-03-20 LAB — COMPREHENSIVE METABOLIC PANEL
ALT: 70 U/L — ABNORMAL HIGH (ref 0–53)
Albumin: 2.3 g/dL — ABNORMAL LOW (ref 3.5–5.2)
Calcium: 9.1 mg/dL (ref 8.4–10.5)
GFR calc Af Amer: 22 mL/min — ABNORMAL LOW (ref >90)
Glucose, Bld: 122 mg/dL — ABNORMAL HIGH (ref 70–99)
Sodium: 137 mEq/L (ref 135–145)
Total Protein: 6.1 g/dL (ref 6.0–8.3)

## 2012-03-20 LAB — RENAL FUNCTION PANEL
CO2: 26 mEq/L (ref 19–32)
Calcium: 8.8 mg/dL (ref 8.4–10.5)
Creatinine, Ser: 2.67 mg/dL — ABNORMAL HIGH (ref 0.50–1.35)
Glucose, Bld: 252 mg/dL — ABNORMAL HIGH (ref 70–99)

## 2012-03-20 LAB — AMMONIA: Ammonia: 17 umol/L (ref 11–60)

## 2012-03-20 MED ORDER — LEVALBUTEROL HCL 0.63 MG/3ML IN NEBU
0.6300 mg | INHALATION_SOLUTION | Freq: Four times a day (QID) | RESPIRATORY_TRACT | Status: DC | PRN
  Filled 2012-03-20: qty 3

## 2012-03-20 NOTE — Progress Notes (Signed)
Physical Therapy Treatment Patient Details Name: [name] MRN: [id number] DOB: 04-13-1941 Today's Date: 03/20/2012 Time: 4696-2952 PT Time Calculation (min): 28 min  PT Assessment / Plan / Recommendation Comments on Treatment Session  Pt s/p CABG.  Pt with very little change.    Follow Up Recommendations  Post acute inpatient rehab    Barriers to Discharge        Equipment Recommendations  None recommended by PT    Recommendations for Other Services    Frequency Min 2X/week   Plan Discharge plan remains appropriate;Frequency remains appropriate    Precautions / Restrictions Precautions Precautions: Sternal;Fall Precaution Comments: Pt needs assist to follow precautions   Pertinent Vitals/Pain VSS    Mobility  Bed Mobility Supine to Sit: 1: +2 Total assist Supine to Sit: Patient Percentage: 30% Sitting - Scoot to Edge of Bed: 1: +2 Total assist Sitting - Scoot to Edge of Bed: Patient Percentage: 30% Sit to Supine: 1: +2 Total assist Sit to Supine: Patient Percentage: 20% Details for Bed Mobility Assistance: Assist to bring trunk up. Transfers Details for Transfer Assistance: Attempted to stand x 1 but pt unable to clear hips with +2 total assist.  Pt tends to scoot forward off edge of bed instead of coming up.    Exercises General Exercises - Upper Extremity Shoulder Flexion: AAROM;Both;10 reps;Supine (to 90 degrees only due to sternal precautions) General Exercises - Lower Extremity Short Arc Quad: AAROM;Both;10 reps;Supine Heel Slides: AAROM;Both;10 reps;Supine Hip ABduction/ADduction: AAROM;Both;10 reps;Supine Straight Leg Raises: AAROM;10 reps;Supine   PT Diagnosis:    PT Problem List:   PT Treatment Interventions:     PT Goals Acute Rehab PT Goals PT Goal: Supine/Side to Sit - Progress: Progressing toward goal PT Goal: Sit at Edge Of Bed - Progress: Progressing toward goal PT Goal: Sit to Supine/Side - Progress: Progressing toward goal PT Goal: Sit  to Stand - Progress: Not progressing  Visit Information  Last PT Received On: 03/20/12 Assistance Needed: +2    Subjective Data  Subjective: "Give me that chair."   Cognition  Overall Cognitive Status: Difficult to assess Difficult to assess due to: Hard of hearing/deaf Arousal/Alertness: Awake/alert Behavior During Session: [facility] for tasks performed Cognition - Other Comments: difficulty following commands due to [facility]    Balance  Static Sitting Balance Static Sitting - Balance Support: Bilateral upper extremity supported;Feet unsupported Static Sitting - Level of Assistance: 4: Min assist;5: Stand by assistance Static Sitting - Comment/# of Minutes: Sat EOB x 6 minutes with supervision except when he  began lying back  End of Session PT - End of Session Activity Tolerance: Patient limited by fatigue Patient left: in bed;with call bell/phone within reach Nurse Communication: Need for lift equipment   GP     Kimetha Trulson 03/20/2012, 10:35 AM  [facility] PT [phone]

## 2012-03-20 NOTE — Plan of Care (Signed)
Problem: Phase I Progression Outcomes Goal: Child psychotherapist consult (if SNF or HD pt.) Outcome: Progressing Social work is working on [employer] placement

## 2012-03-20 NOTE — Progress Notes (Signed)
Patient examined and record reviewed.Hemodynamics stable,labs satisfactory.Patient had stable day.Continue current care. VAN TRIGT III,PETER 03/20/2012

## 2012-03-20 NOTE — Progress Notes (Signed)
[address]            [place] [address]          [phone]     TCTS DAILY PROGRESS NOTE                   [address]            [place] [address]          [phone]      15 Days Post-Op Procedure(s) (LRB): CORONARY ARTERY BYPASS GRAFTING (CABG) (N/A)  Total Length of Stay:  LOS: 22 days   Subjective: No new complaints  Objective: Vital signs in last 24 hours: Temp:  [97.5 F (36.4 C)-98.5 F (36.9 C)] 98.2 F (36.8 C) (10/01 0737) Pulse Rate:  [75-88] 81  (10/01 0800) Cardiac Rhythm:  [-] Normal sinus rhythm (10/01 0800) Resp:  [13-20] 13  (10/01 0800) BP: (102-138)/(50-71) 134/51 mmHg (10/01 0800) SpO2:  [92 %-96 %] 93 % (10/01 0800) Weight:  [268 lb 15.4 oz (122 kg)] 268 lb 15.4 oz (122 kg) (10/01 0600)  Filed Weights   03/18/12 0400 03/19/12 0300 03/20/12 0600  Weight: 279 lb 5.2 oz (126.7 kg) 272 lb 14.9 oz (123.8 kg) 268 lb 15.4 oz (122 kg)    Weight change: -3 lb 15.5 oz (-1.8 kg)   Hemodynamic parameters for last 24 hours:    Intake/Output from previous day: 09/30 0701 - 10/01 0700 In: 678 [P.O.:120; I.V.:398; IV Piggyback:160] Out: 4810 [Urine:4810]  Intake/Output this shift: Total I/O In: 32 [I.V.:32] Out: 450 [Urine:450]  Current Meds: Scheduled Meds:   . amiodarone  200 mg Oral Daily  . aspirin EC  325 mg Oral Daily   Or  . aspirin  324 mg Per Tube Daily  . bisacodyl  10 mg Oral Daily   Or  . bisacodyl  10 mg Rectal Daily  . brimonidine  1 drop Left Eye BID  . ceFEPime (MAXIPIME) IV  1 g Intravenous Q24H  . darbepoetin (ARANESP) injection - NON-DIALYSIS  200 mcg Subcutaneous Q Sun-1800  . docusate sodium  200 mg Oral Daily  . dorzolamide-timolol  1 drop Left Eye BID  . erythromycin  1 application Right Eye BID  . ezetimibe  10 mg Oral Daily  . feeding supplement  237 mL Oral BID BM  . ferric gluconate (FERRLECIT/NULECIT) IV  125 mg Intravenous Daily  . insulin aspart   0-24 Units Subcutaneous Q4H  . insulin aspart  4 Units Subcutaneous TID WC  . insulin glargine  14 Units Subcutaneous BID  . ipratropium  1 spray Nasal BID  . levalbuterol  0.63 mg Nebulization BID  . loteprednol  1 drop Right Eye BID  . metoprolol tartrate  12.5 mg Oral BID   Or  . metoprolol tartrate  12.5 mg Per Tube BID  . pantoprazole  40 mg Oral Q1200  . polyvinyl alcohol  1 drop Both Eyes BID  . DISCONTD: levalbuterol  0.63 mg Nebulization TID   Continuous Infusions:   . sodium chloride 10 mL/hr at 03/20/12 0800  . DOPamine 2.498 mcg/kg/min (03/20/12 0800)   PRN Meds:.albuterol, lactulose, metoprolol, ondansetron (ZOFRAN) IV, oxyCODONE, sodium chloride  General appearance: cooperative, fatigued and no distress Heart: regular rate and rhythm Lungs: fairly clear anteriorly Abdomen: soft, nontender, + BS Extremities: + edema Wound: some serous drainage L evh  incisions, no evid of infection  Lab Results: CBC: Basename 03/19/12 0357 03/18/12 0400  WBC 13.6* 19.4*  HGB 9.5* 9.1*  HCT 28.2* 27.1*  PLT 233 246   BMET:  Basename 03/20/12 0459 03/19/12 0357  NA 137 132*  K 4.0 3.8  CL 99 94*  CO2 25 24  GLUCOSE 122* 116*  BUN 112* 125*  CREATININE 3.01* 3.71*  CALCIUM 9.1 8.9    PT/INR: No results found for this basename: LABPROT,INR in the last 72 hours Radiology: No results found.   Assessment/Plan: S/P Procedure(s) (LRB): CORONARY ARTERY BYPASS GRAFTING (CABG) (N/A)  1. AKI/CKD- autodiuresis , UTI on cefepime 2 slow recovery, cont PT/rehab, will need SNF   @ME1 @ 03/20/2012 8:48 AM

## 2012-03-20 NOTE — Progress Notes (Signed)
Patient ID: ESPEN BETHEL, male   DOB: [birth year], 71 y.o.   MRN: [id number]   [facility] Progress Note    Subjective:   Poorly responsive to interaction since early yesterday, the patient denies any emerging complaints and simply nods head to questions. Will not engage in open-ended questions.    Objective:   BP 129/60  Pulse 81  Temp 98.2 F (36.8 C) (Oral)  Resp 17  Ht 5\' 10"  (1.778 m)  Wt 122 kg (268 lb 15.4 oz)  BMI 38.59 kg/m2  SpO2 94%  Intake/Output Summary (Last 24 hours) at 03/20/12 0737 Last data filed at 03/20/12 0600  Gross per 24 hour  Intake    678 ml  Output   4810 ml  Net  -4132 ml   Weight change: -1.8 kg (-3 lb 15.5 oz)  Physical Exam: Gen: Comfortably sitting propped up in bed CVS: pulse regular in rate and rhythm, heart sounds S1 and S2 distant Resp: Diminished breath sounds left base otherwise clear to auscultation, no obvious rales/rhonchi Abd: Soft, obese, nontender and bowel sounds are normal Ext: 1-2+ edema over upper and lower extremities, 2+ edema over lower back.  Imaging: No results found.  Labs: BMET  Lab 03/20/12 0459 03/19/12 0357 03/18/12 0400 03/17/12 0350 03/16/12 0415 03/15/12 0440 03/14/12 0415  NA 137 132* 131* 127* 127* 130* 130*  K 4.0 3.8 4.0 3.8 4.1 3.6 3.6  CL 99 94* 92* 88* 89* 92* 92*  CO2 25 24 22 22 23 24 24   GLUCOSE 122* 116* 51* 86 80 130* 179*  BUN 112* 125* 132* 126* 113* 104* 90*  CREATININE 3.01* 3.71* 4.29* 4.55* 4.20* 3.45* 2.77*  ALB -- -- -- -- -- -- --  CALCIUM 9.1 8.9 8.6 8.7 8.6 8.6 8.8  PHOS -- 6.9* -- -- -- -- --   CBC  Lab 03/19/12 0357 03/18/12 0400 03/17/12 0350 03/16/12 0415  WBC 13.6* 19.4* 23.1* 27.3*  NEUTROABS -- -- 20.3* --  HGB 9.5* 9.1* 9.1* 8.5*  HCT 28.2* 27.1* 26.9* 25.5*  MCV 87.6 86.0 85.9 87.0  PLT 233 246 240 233    Medications:      . amiodarone  200 mg Oral Daily  . aspirin EC  325 mg Oral Daily   Or  . aspirin  324 mg Per Tube Daily  . bisacodyl   10 mg Oral Daily   Or  . bisacodyl  10 mg Rectal Daily  . brimonidine  1 drop Left Eye BID  . ceFEPime (MAXIPIME) IV  1 g Intravenous Q24H  . darbepoetin (ARANESP) injection - NON-DIALYSIS  200 mcg Subcutaneous Q Sun-1800  . docusate sodium  200 mg Oral Daily  . dorzolamide-timolol  1 drop Left Eye BID  . erythromycin  1 application Right Eye BID  . ezetimibe  10 mg Oral Daily  . feeding supplement  237 mL Oral BID BM  . ferric gluconate (FERRLECIT/NULECIT) IV  125 mg Intravenous Daily  . insulin aspart  0-24 Units Subcutaneous Q4H  . insulin aspart  4 Units Subcutaneous TID WC  . insulin glargine  14 Units Subcutaneous BID  . ipratropium  1 spray Nasal BID  . levalbuterol  0.63 mg Nebulization BID  . loteprednol  1 drop Right Eye BID  . metoprolol tartrate  12.5 mg Oral BID   Or  . metoprolol tartrate  12.5 mg Per Tube BID  . pantoprazole  40 mg Oral Q1200  . polyvinyl alcohol  1 drop  Both Eyes BID  . DISCONTD: levalbuterol  0.63 mg Nebulization TID     Assessment/ Plan:   1. Acute Kidney Injury - On CKD - diabetic nephropathy (baseline creatinine of 1.7). His AKI is thought to be from ischemic ATN with retroperitoneal bleed/hemodynamic fluctuations with repeat injury due to what now appears to be a UTI/SIRS. Currently with improving UOP and renal function. Unclear if mental status is entirely due to "uremia" or whether other etiology- will check ammonia level and consider need for CT head. It appears that he seen his diuretic phase of ATN recovery, we'll not start diuretics for now and just simply allow auto diuresis. We'll monitor electrolytes closely for volume depletion/wasting of potassium and magnesium. 2. Pseudomonas UTI - positive culture 9/24, pan sensitive, on cefepime. Not bacteremic-Blood cx negative.  3. Normocytic Anemia - Likely secondary to acute blood loss from retroperitoneal bleed and blood loss from surgery. Currently on intravenous iron/subcutaneous ESA therapy.    4. Hyponatremia - due to impaired free water handling in the face of acute renal failure, sodium level now improving with diuretic phase of ATN/free water clearance. 5. Coronary artery disease: s/p 4 vessel CABG on 9/16. decreased metoprolol to 12.5 mg BID on 9/27 to promote renal perfusion/minimize hypertension related renal injury. Remains heart rate controlled and with acceptable blood pressures.   Zetta Bills, MD 03/20/2012, 7:37 AM

## 2012-03-21 LAB — COMPREHENSIVE METABOLIC PANEL
ALT: 59 U/L — ABNORMAL HIGH (ref 0–53)
AST: 28 U/L (ref 0–37)
Albumin: 2.4 g/dL — ABNORMAL LOW (ref 3.5–5.2)
Alkaline Phosphatase: 120 U/L — ABNORMAL HIGH (ref 39–117)
BUN: 95 mg/dL — ABNORMAL HIGH (ref 6–23)
CO2: 27 mEq/L (ref 19–32)
Calcium: 9.4 mg/dL (ref 8.4–10.5)
Chloride: 103 mEq/L (ref 96–112)
Creatinine, Ser: 2.46 mg/dL — ABNORMAL HIGH (ref 0.50–1.35)
GFR calc Af Amer: 28 mL/min — ABNORMAL LOW (ref >90)
GFR calc non Af Amer: 24 mL/min — ABNORMAL LOW (ref >90)
Glucose, Bld: 158 mg/dL — ABNORMAL HIGH (ref 70–99)
Potassium: 3.8 mEq/L (ref 3.5–5.1)
Sodium: 141 mEq/L (ref 135–145)
Total Bilirubin: 0.8 mg/dL (ref 0.3–1.2)
Total Protein: 6 g/dL (ref 6.0–8.3)

## 2012-03-21 LAB — GLUCOSE, CAPILLARY
Glucose-Capillary: 140 mg/dL — ABNORMAL HIGH (ref 70–99)
Glucose-Capillary: 144 mg/dL — ABNORMAL HIGH (ref 70–99)
Glucose-Capillary: 156 mg/dL — ABNORMAL HIGH (ref 70–99)
Glucose-Capillary: 163 mg/dL — ABNORMAL HIGH (ref 70–99)
Glucose-Capillary: 219 mg/dL — ABNORMAL HIGH (ref 70–99)

## 2012-03-21 NOTE — Progress Notes (Signed)
Patient ID: [name], male   DOB: [birth year], 71 y.o.   MRN: [id number]   [facility] Progress Note    Subjective:   More interactive/responsive with staff overnight and without emerging clinical complaints   Objective:   BP 132/72  Pulse 87  Temp 98.2 F (36.8 C) (Oral)  Resp 23  Ht 5\' 10"  (1.778 m)  Wt 118.1 kg (260 lb 5.8 oz)  BMI 37.36 kg/m2  SpO2 97%  Intake/Output Summary (Last 24 hours) at 03/21/12 0759 Last data filed at 03/21/12 0700  Gross per 24 hour  Intake    890 ml  Output   5225 ml  Net  -4335 ml   Weight change: -3.9 kg (-8 lb 9.6 oz)  Physical Exam: WUJ:WJXBJYNWGN sitting OOB in recliner FAO:ZHYQM RRR, normal S1 and S2  Resp:Coarse BS bilaterally, no obvious rales/rhonchi VHQ:IONG, obese, NT, BS normal Ext:1-2+ LE and presacral edema  Imaging: No results found.  Labs: BMET  Lab 03/21/12 0400 03/20/12 1600 03/20/12 0459 03/19/12 0357 03/18/12 0400 03/17/12 0350 03/16/12 0415  NA 141 136 137 132* 131* 127* 127*  K 3.8 3.9 4.0 3.8 4.0 3.8 4.1  CL 103 97 99 94* 92* 88* 89*  CO2 27 26 25 24 22 22 23   GLUCOSE 158* 252* 122* 116* 51* 86 80  BUN 95* 105* 112* 125* 132* 126* 113*  CREATININE 2.46* 2.67* 3.01* 3.71* 4.29* 4.55* 4.20*  ALB -- -- -- -- -- -- --  CALCIUM 9.4 8.8 9.1 8.9 8.6 8.7 8.6  PHOS -- 4.3 -- 6.9* -- -- --   CBC  Lab 03/19/12 0357 03/18/12 0400 03/17/12 0350 03/16/12 0415  WBC 13.6* 19.4* 23.1* 27.3*  NEUTROABS -- -- 20.3* --  HGB 9.5* 9.1* 9.1* 8.5*  HCT 28.2* 27.1* 26.9* 25.5*  MCV 87.6 86.0 85.9 87.0  PLT 233 246 240 233    Medications:      . amiodarone  200 mg Oral Daily  . aspirin EC  325 mg Oral Daily   Or  . aspirin  324 mg Per Tube Daily  . bisacodyl  10 mg Oral Daily   Or  . bisacodyl  10 mg Rectal Daily  . brimonidine  1 drop Left Eye BID  . ceFEPime (MAXIPIME) IV  1 g Intravenous Q24H  . darbepoetin (ARANESP) injection - NON-DIALYSIS  200 mcg Subcutaneous Q Sun-1800  . docusate  sodium  200 mg Oral Daily  . dorzolamide-timolol  1 drop Left Eye BID  . erythromycin  1 application Right Eye BID  . ezetimibe  10 mg Oral Daily  . feeding supplement  237 mL Oral BID BM  . ferric gluconate (FERRLECIT/NULECIT) IV  125 mg Intravenous Daily  . insulin aspart  0-24 Units Subcutaneous Q4H  . insulin aspart  4 Units Subcutaneous TID WC  . insulin glargine  14 Units Subcutaneous BID  . ipratropium  1 spray Nasal BID  . loteprednol  1 drop Right Eye BID  . metoprolol tartrate  12.5 mg Oral BID   Or  . metoprolol tartrate  12.5 mg Per Tube BID  . pantoprazole  40 mg Oral Q1200  . polyvinyl alcohol  1 drop Both Eyes BID  . DISCONTD: levalbuterol  0.63 mg Nebulization BID     Assessment/ Plan:   1. Acute Kidney Injury - On CKD - diabetic nephropathy (baseline creatinine of 1.7). His AKI is thought to be ischemic ATN with retroperitoneal bleed/hemodynamic fluctuations with repeat injury from UTI/SIRS.  Currently with polyuric UOP in what appears to be ATN diuretic phase and renal function continues to improve- continue to allow auto-diuresis and monitor electrolytes closely for volume depletion/wasting of potassium and magnesium. Mental status improving. On dopamine gtt- may be able to wean off. 2. Pseudomonas UTI - positive culture 9/24, pan sensitive, on cefepime. Not bacteremic-Blood cx negative.  3. Normocytic Anemia - Likely secondary to acute blood loss from retroperitoneal bleed and blood loss from surgery. Currently on intravenous iron/subcutaneous ESA therapy.  4. Hyponatremia - due to impaired free water handling in the face of acute renal failure, sodium level now improving with diuretic phase of ATN/free water clearance.  5. Coronary artery disease: s/p 4 vessel CABG on 9/16. On metoprolol 12.5 mg BID. Remains heart rate controlled and with acceptable blood pressures.   Zetta Bills, MD 03/21/2012, 7:59 AM

## 2012-03-21 NOTE — Progress Notes (Signed)
[facility] DAILY PROGRESS NOTE                   [address]            [place] [address]          [phone]      16 Days Post-Op Procedure(s) (LRB): CORONARY ARTERY BYPASS GRAFTING (CABG) (N/A)  Total Length of Stay:  LOS: 23 days   Subjective: Up to chair   Objective: Vital signs in last 24 hours: Temp:  [98.2 F (36.8 C)-98.9 F (37.2 C)] 98.2 F (36.8 C) (10/02 0725) Pulse Rate:  [75-94] 87  (10/02 0800) Cardiac Rhythm:  [-] Normal sinus rhythm (10/02 0800) Resp:  [13-23] 16  (10/02 0800) BP: (105-138)/(52-76) 128/76 mmHg (10/02 0800) SpO2:  [90 %-97 %] 95 % (10/02 0800) Weight:  [260 lb 5.8 oz (118.1 kg)] 260 lb 5.8 oz (118.1 kg) (10/02 0600)  Filed Weights   03/19/12 0300 03/20/12 0600 03/21/12 0600  Weight: 272 lb 14.9 oz (123.8 kg) 268 lb 15.4 oz (122 kg) 260 lb 5.8 oz (118.1 kg)    Weight change: -8 lb 9.6 oz (-3.9 kg)   Hemodynamic parameters for last 24 hours:    Intake/Output from previous day: 10/01 0701 - 10/02 0700 In: 890 [P.O.:440; I.V.:400; IV Piggyback:50] Out: 5225 [Urine:5225]  Intake/Output this shift: Total I/O In: 366 [P.O.:350; I.V.:16] Out: 50 [Urine:50]  Current Meds: Scheduled Meds:   . amiodarone  200 mg Oral Daily  . aspirin EC  325 mg Oral Daily   Or  . aspirin  324 mg Per Tube Daily  . bisacodyl  10 mg Oral Daily   Or  . bisacodyl  10 mg Rectal Daily  . brimonidine  1 drop Left Eye BID  . ceFEPime (MAXIPIME) IV  1 g Intravenous Q24H  . darbepoetin (ARANESP) injection - NON-DIALYSIS  200 mcg Subcutaneous Q Sun-1800  . docusate sodium  200 mg Oral Daily  . dorzolamide-timolol  1 drop Left Eye BID  . erythromycin  1 application Right Eye BID  . ezetimibe  10 mg Oral Daily  . feeding supplement  237 mL Oral BID BM  . ferric gluconate (FERRLECIT/NULECIT) IV  125 mg Intravenous Daily  . insulin aspart  0-24 Units Subcutaneous Q4H  . insulin aspart  4 Units Subcutaneous TID WC  . insulin glargine  14  Units Subcutaneous BID  . ipratropium  1 spray Nasal BID  . loteprednol  1 drop Right Eye BID  . metoprolol tartrate  12.5 mg Oral BID   Or  . metoprolol tartrate  12.5 mg Per Tube BID  . pantoprazole  40 mg Oral Q1200  . polyvinyl alcohol  1 drop Both Eyes BID  . DISCONTD: levalbuterol  0.63 mg Nebulization BID   Continuous Infusions:   . sodium chloride 10 mL/hr at 03/20/12 1500  . DOPamine 2.5 mcg/kg/min (03/20/12 2000)   PRN Meds:.albuterol, lactulose, levalbuterol, metoprolol, ondansetron (ZOFRAN) IV, oxyCODONE, sodium chloride  General appearance: alert, cooperative and no distress Neurologic: intact Heart: regular rate and rhythm, S1, S2 normal, no murmur, click, rub or gallop Lungs: clear to auscultation bilaterally Abdomen: soft, non-tender; bowel sounds normal; no masses,  no organomegaly Extremities: extremities normal, atraumatic, no cyanosis or edema and Homans sign is negative, no sign of DVT Wound: sternum stable  Lab Results: CBC: Basename 03/19/12 0357  WBC 13.6*  HGB 9.5*  HCT 28.2*  PLT 233   BMET:  Basename 03/21/12  0400 03/20/12 1600  NA 141 136  K 3.8 3.9  CL 103 97  CO2 27 26  GLUCOSE 158* 252*  BUN 95* 105*  CREATININE 2.46* 2.67*  CALCIUM 9.4 8.8    PT/INR: No results found for this basename: LABPROT,INR in the last 72 hours Radiology: No results found.   Assessment/Plan: S/P Procedure(s) (LRB): CORONARY ARTERY BYPASS GRAFTING (CABG) (N/A) Mobilize Diuresis Diabetes control Continue foley due to acute urinary retention, diuresing patient, strict I&O, patient in ICU and urinary output monitoring Improving renal function Will need long term rehab to get out of hospital    [name] MD  Beeper [phone] Office [phone] 03/21/2012 8:55 AM

## 2012-03-21 NOTE — Progress Notes (Signed)
Occupational Therapy Treatment and Goal Update Patient Details Name: [name] MRN: [id number] DOB: [birth year] Today's Date: 03/21/2012 Time: 7829-5621 OT Time Calculation (min): 28 min  OT Assessment / Plan / Recommendation Comments on Treatment Session Focused on self feeding with adaptive equipment and grooming in sitting.  Continues to be difficult to assess pt's cognition due to low vision and pt being [facility].  Requires significant cuing both tactile and visual. Pt continues to require lift for OOB.    Follow Up Recommendations  Skilled nursing facility    Barriers to Discharge       Equipment Recommendations  None recommended by OT    Recommendations for Other Services    Frequency Min 2X/week   Plan Discharge plan remains appropriate    Precautions / Restrictions Precautions Precautions: Sternal;Fall Restrictions Weight Bearing Restrictions:  (sternal precautions)   Pertinent Vitals/Pain     ADL  Eating/Feeding: Performed;Moderate assistance Where Assessed - Eating/Feeding: Chair Grooming: Performed;Moderate assistance Where Assessed - Grooming: Supported sitting ADL Comments: Focus of therapy session on self feeding and grooming seated in chair.  Pt is L handed--weighted cup with lid issued and set up for L hand.  Pt able to take his own drink with tactile and verbal cuing.  Placed red tubing on spoon and practiced self feeding with applesauce.  Pt able to self feed with mod assist and tactile and verbal cue.  Pt was more successful with applesauce in a black bowl increasing the visual contrast.    OT Diagnosis:    OT Problem List:   OT Treatment Interventions:     OT Goals Acute Rehab OT Goals OT Goal Formulation: With patient/family Time For Goal Achievement: 04/04/12 Potential to Achieve Goals: Good ADL Goals Pt Will Perform Eating: with min assist;with cueing (comment type and amount);with adaptive utensils;Other (comment);Sitting, chair (high visual  contrast set up) ADL Goal: Eating - Progress: Goal set today Pt Will Perform Grooming: with min assist;Sitting, chair ADL Goal: Grooming - Progress: Goal set today  Visit Information  Last OT Received On: 03/21/12 Assistance Needed: +2    Subjective Data      Prior Functioning       Cognition  Overall Cognitive Status: Difficult to assess Difficult to assess due to: Hard of hearing/deaf Arousal/Alertness: Awake/alert Behavior During Session: [facility] for tasks performed Cognition - Other Comments: difficulty following commands due to [facility]    Mobility  Shoulder Instructions Bed Mobility Bed Mobility: Not assessed       Exercises      Balance     End of Session OT - End of Session Equipment Utilized During Treatment: Other (comment) (red tubing, weighted cup) Activity Tolerance: Patient tolerated treatment well Patient left: in chair;with call bell/phone within reach Nurse Communication: Other (comment) (use of AE, pt's performance)  GO     Evern Bio 03/21/2012, 9:32 AM [phone]

## 2012-03-22 ENCOUNTER — Inpatient Hospital Stay (HOSPITAL_COMMUNITY): Payer: Medicare Other

## 2012-03-22 ENCOUNTER — Encounter (HOSPITAL_COMMUNITY): Payer: Self-pay | Admitting: Physical Medicine and Rehabilitation

## 2012-03-22 DIAGNOSIS — R5381 Other malaise: Secondary | ICD-10-CM

## 2012-03-22 DIAGNOSIS — G541 Lumbosacral plexus disorders: Secondary | ICD-10-CM

## 2012-03-22 DIAGNOSIS — G8310 Monoplegia of lower limb affecting unspecified side: Secondary | ICD-10-CM

## 2012-03-22 LAB — COMPREHENSIVE METABOLIC PANEL
ALT: 49 U/L (ref 0–53)
AST: 25 U/L (ref 0–37)
Albumin: 2.5 g/dL — ABNORMAL LOW (ref 3.5–5.2)
Alkaline Phosphatase: 110 U/L (ref 39–117)
BUN: 72 mg/dL — ABNORMAL HIGH (ref 6–23)
CO2: 27 mEq/L (ref 19–32)
Calcium: 9.4 mg/dL (ref 8.4–10.5)
Chloride: 102 mEq/L (ref 96–112)
Creatinine, Ser: 2.02 mg/dL — ABNORMAL HIGH (ref 0.50–1.35)
GFR calc Af Amer: 36 mL/min — ABNORMAL LOW (ref >90)
GFR calc non Af Amer: 31 mL/min — ABNORMAL LOW (ref >90)
Glucose, Bld: 183 mg/dL — ABNORMAL HIGH (ref 70–99)
Potassium: 4 mEq/L (ref 3.5–5.1)
Sodium: 139 mEq/L (ref 135–145)
Total Bilirubin: 0.8 mg/dL (ref 0.3–1.2)
Total Protein: 6 g/dL (ref 6.0–8.3)

## 2012-03-22 LAB — GLUCOSE, CAPILLARY
Glucose-Capillary: 181 mg/dL — ABNORMAL HIGH (ref 70–99)
Glucose-Capillary: 166 mg/dL — ABNORMAL HIGH (ref 70–99)

## 2012-03-22 LAB — CBC
HCT: 31.4 % — ABNORMAL LOW (ref 39.0–52.0)
Hemoglobin: 10.3 g/dL — ABNORMAL LOW (ref 13.0–17.0)
MCH: 29.3 pg (ref 26.0–34.0)
MCHC: 32.8 g/dL (ref 30.0–36.0)
MCV: 89.5 fL (ref 78.0–100.0)
Platelets: 199 10*3/uL (ref 150–400)
RBC: 3.51 MIL/uL — ABNORMAL LOW (ref 4.22–5.81)
RDW: 16.7 % — ABNORMAL HIGH (ref 11.5–15.5)
WBC: 12.2 10*3/uL — ABNORMAL HIGH (ref 4.0–10.5)

## 2012-03-22 MED ORDER — CITALOPRAM HYDROBROMIDE 40 MG PO TABS
40.0000 mg | ORAL_TABLET | Freq: Every day | ORAL | Status: DC
  Administered 2012-03-22: 40 mg via ORAL
  Filled 2012-03-22 (×2): qty 1

## 2012-03-22 NOTE — Progress Notes (Signed)
ANTIBIOTIC CONSULT NOTE - FOLLOW UP  Pharmacy Consult for Cefepime Indication: Pseudomonas UTI  No Known Allergies  Patient Measurements: Height: 5\' 10"  (177.8 cm) Weight: 256 lb 13.4 oz (116.5 kg) IBW/kg (Calculated) : 73   Vital Signs: Temp: 98.2 F (36.8 C) (10/03 0724) Temp src: Oral (10/03 0724) BP: 155/84 mmHg (10/03 1015) Pulse Rate: 93  (10/03 1015) Intake/Output from previous day: 10/02 0701 - 10/03 0700 In: 1094 [P.O.:650; I.V.:394; IV Piggyback:50] Out: 4623 [Urine:4620; Stool:3] Intake/Output from this shift: Total I/O In: 30.8 [I.V.:30.8] Out: 525 [Urine:525]  Labs:  Basename 03/22/12 0425 03/21/12 0400 03/20/12 1600  WBC 12.2* -- --  HGB 10.3* -- --  PLT 199 -- --  LABCREA -- -- --  CREATININE 2.02* 2.46* 2.67*   Estimated Creatinine Clearance: 41.6 ml/min (by C-G formula based on Cr of 2.02). No results found for this basename: VANCOTROUGH:2,VANCOPEAK:2,VANCORANDOM:2,GENTTROUGH:2,GENTPEAK:2,GENTRANDOM:2,TOBRATROUGH:2,TOBRAPEAK:2,TOBRARND:2,AMIKACINPEAK:2,AMIKACINTROU:2,AMIKACIN:2, in the last 72 hours   Microbiology: Recent Results (from the past 720 hour(s))  MRSA PCR SCREENING     Status: Normal   Collection Time   02/28/12  2:44 PM      Component Value Range Status Comment   MRSA by PCR NEGATIVE  NEGATIVE Final   SURGICAL PCR SCREEN     Status: Normal   Collection Time   03/04/12 11:21 PM      Component Value Range Status Comment   MRSA, PCR NEGATIVE  NEGATIVE Final    Staphylococcus aureus NEGATIVE  NEGATIVE Final   URINE CULTURE     Status: Normal   Collection Time   03/13/12 12:11 PM      Component Value Range Status Comment   Specimen Description URINE, CLEAN CATCH   Final    Special Requests NONE   Final    Culture  Setup Time 03/13/2012 20:49   Final    Colony Count >=100,000 COLONIES/ML   Final    Culture PSEUDOMONAS AERUGINOSA   Final    Report Status 03/15/2012 FINAL   Final    Organism ID, Bacteria PSEUDOMONAS AERUGINOSA   Final     CULTURE, BLOOD (SINGLE)     Status: Normal   Collection Time   03/13/12  3:09 PM      Component Value Range Status Comment   Specimen Description BLOOD ARM LEFT   Final    Special Requests     Final    Value: BOTTLES DRAWN AEROBIC AND ANAEROBIC 10CC AER 4CC ANA   Culture  Setup Time 03/13/2012 20:13   Final    Culture NO GROWTH 5 DAYS   Final    Report Status 03/19/2012 FINAL   Final   CULTURE, EXPECTORATED SPUTUM-ASSESSMENT     Status: Normal   Collection Time   03/15/12 10:11 AM      Component Value Range Status Comment   Specimen Description SPUTUM   Final    Special Requests NONE   Final    Sputum evaluation     Final    Value: THIS SPECIMEN IS ACCEPTABLE. RESPIRATORY CULTURE REPORT TO FOLLOW.   Report Status 03/15/2012 FINAL   Final   CULTURE, RESPIRATORY     Status: Normal   Collection Time   03/15/12 10:11 AM      Component Value Range Status Comment   Specimen Description SPUTUM   Final    Special Requests NONE   Final    Gram Stain     Final    Value: MODERATE WBC PRESENT, PREDOMINANTLY PMN     RARE  SQUAMOUS EPITHELIAL CELLS PRESENT     FEW GRAM NEGATIVE RODS     FEW GRAM POSITIVE COCCI IN PAIRS     IN CLUSTERS IN CHAINS   Culture NORMAL OROPHARYNGEAL FLORA   Final    Report Status 03/17/2012 FINAL   Final   URINE CULTURE     Status: Normal   Collection Time   03/17/12  8:52 AM      Component Value Range Status Comment   Specimen Description URINE, CATHETERIZED   Final    Special Requests NONE   Final    Culture  Setup Time 03/17/2012 14:36   Final    Colony Count NO GROWTH   Final    Culture NO GROWTH   Final    Report Status 03/18/2012 FINAL   Final     Anti-infectives     Start     Dose/Rate Route Frequency Ordered Stop   03/16/12 1000   ceFEPIme (MAXIPIME) 1 g in dextrose 5 % 50 mL IVPB     Comments: Pharmacy to dose      1 g 100 mL/hr over 30 Minutes Intravenous Every 24 hours 03/15/12 0927     03/15/12 1400   vancomycin (VANCOCIN) 1,500 mg in sodium  chloride 0.9 % 500 mL IVPB  Status:  Discontinued        1,500 mg 250 mL/hr over 120 Minutes Intravenous Every 48 hours 03/14/12 1258 03/15/12 0842   03/15/12 0900   ceFEPIme (MAXIPIME) 1 g in dextrose 5 % 50 mL IVPB     Comments: Pharmacy to dose      1 g 100 mL/hr over 30 Minutes Intravenous  Once 03/15/12 0842 03/15/12 1031   03/14/12 1300   vancomycin (VANCOCIN) 750 mg in sodium chloride 0.9 % 150 mL IVPB        750 mg 150 mL/hr over 60 Minutes Intravenous  Once 03/14/12 1257 03/14/12 1456   03/13/12 1200   piperacillin-tazobactam (ZOSYN) IVPB 3.375 g  Status:  Discontinued        3.375 g 12.5 mL/hr over 240 Minutes Intravenous Every 8 hours 03/13/12 1050 03/15/12 0842   03/13/12 1100   vancomycin (VANCOCIN) 750 mg in sodium chloride 0.9 % 150 mL IVPB        750 mg 150 mL/hr over 60 Minutes Intravenous  Once 03/13/12 1050 03/13/12 1359   03/06/12 0000   cefUROXime (ZINACEF) 1.5 g in dextrose 5 % 50 mL IVPB        1.5 g 100 mL/hr over 30 Minutes Intravenous Every 12 hours 03/05/12 1355 03/07/12 0949   03/05/12 2000   vancomycin (VANCOCIN) IVPB 1000 mg/200 mL premix        1,000 mg 200 mL/hr over 60 Minutes Intravenous  Once 03/05/12 1355 03/05/12 2044   03/05/12 0400   vancomycin (VANCOCIN) 1,250 mg in sodium chloride 0.9 % 250 mL IVPB        1,250 mg 166.7 mL/hr over 90 Minutes Intravenous To Surgery 03/04/12 1415 03/05/12 0718   03/05/12 0400   cefUROXime (ZINACEF) 2 g in dextrose 5 % 50 mL IVPB  Status:  Discontinued        2 g 100 mL/hr over 30 Minutes Intravenous To Surgery 03/04/12 1415 03/04/12 1738   03/05/12 0400   cefUROXime (ZINACEF) 750 mg in dextrose 5 % 50 mL IVPB  Status:  Discontinued        750 mg 100 mL/hr over 30 Minutes Intravenous To Surgery 03/04/12 1415  03/05/12 1104   03/05/12 0400   cefUROXime (ZINACEF) 1.5 g in dextrose 5 % 50 mL IVPB        1.5 g 100 mL/hr over 30 Minutes Intravenous To Surgery 03/04/12 1738 03/05/12 1145           Assessment: Pseudomonas UTI -  Day #10 of Cefepime Renal function improving  Goal of Therapy: Eradication of infection  Plan:  Consider dc cefepime   Talbert Cage, Pharm.D Clinical Pharmacist  Phone [phone] Pager [phone] 03/22/2012, 10:29 AM

## 2012-03-22 NOTE — Progress Notes (Signed)
Patient ID: [name], male   DOB: [birth year], 71 y.o.   MRN: [id number]   [facility] Progress Note    Subjective:   Anxious this AM pulling away at lines/monitors and complaining of shortness of breath   Objective:   BP 161/81  Pulse 91  Temp 98.2 F (36.8 C) (Oral)  Resp 17  Ht 5\' 10"  (1.778 m)  Wt 116.5 kg (256 lb 13.4 oz)  BMI 36.85 kg/m2  SpO2 96%  Intake/Output Summary (Last 24 hours) at 03/22/12 0744 Last data filed at 03/22/12 0600  Gross per 24 hour  Intake   1078 ml  Output   4623 ml  Net  -3545 ml   Weight change: -1.6 kg (-3 lb 8.4 oz)  Physical Exam: WUJ:WJXBJYNWG resting in bed NFA:OZHYQ RRR, normal S1 and S2  Resp:Decreased BS left base- otherwise CTA, no obvious rales/rhonchi MVH:QION, obese, NT, BS normal GEX:BMWUX LE/back edema  Imaging: No results found.  Labs: BMET  Lab 03/22/12 0425 03/21/12 0400 03/20/12 1600 03/20/12 0459 03/19/12 0357 03/18/12 0400 03/17/12 0350  NA 139 141 136 137 132* 131* 127*  K 4.0 3.8 3.9 4.0 3.8 4.0 3.8  CL 102 103 97 99 94* 92* 88*  CO2 27 27 26 25 24 22 22   GLUCOSE 183* 158* 252* 122* 116* 51* 86  BUN 72* 95* 105* 112* 125* 132* 126*  CREATININE 2.02* 2.46* 2.67* 3.01* 3.71* 4.29* 4.55*  ALB -- -- -- -- -- -- --  CALCIUM 9.4 9.4 8.8 9.1 8.9 8.6 8.7  PHOS -- -- 4.3 -- 6.9* -- --   CBC  Lab 03/22/12 0425 03/19/12 0357 03/18/12 0400 03/17/12 0350  WBC 12.2* 13.6* 19.4* 23.1*  NEUTROABS -- -- -- 20.3*  HGB 10.3* 9.5* 9.1* 9.1*  HCT 31.4* 28.2* 27.1* 26.9*  MCV 89.5 87.6 86.0 85.9  PLT 199 233 246 240    Medications:      . amiodarone  200 mg Oral Daily  . aspirin EC  325 mg Oral Daily   Or  . aspirin  324 mg Per Tube Daily  . bisacodyl  10 mg Oral Daily   Or  . bisacodyl  10 mg Rectal Daily  . brimonidine  1 drop Left Eye BID  . ceFEPime (MAXIPIME) IV  1 g Intravenous Q24H  . darbepoetin (ARANESP) injection - NON-DIALYSIS  200 mcg Subcutaneous Q Sun-1800  . docusate sodium   200 mg Oral Daily  . dorzolamide-timolol  1 drop Left Eye BID  . erythromycin  1 application Right Eye BID  . ezetimibe  10 mg Oral Daily  . feeding supplement  237 mL Oral BID BM  . insulin aspart  0-24 Units Subcutaneous Q4H  . insulin aspart  4 Units Subcutaneous TID WC  . insulin glargine  14 Units Subcutaneous BID  . ipratropium  1 spray Nasal BID  . loteprednol  1 drop Right Eye BID  . metoprolol tartrate  12.5 mg Oral BID   Or  . metoprolol tartrate  12.5 mg Per Tube BID  . pantoprazole  40 mg Oral Q1200  . polyvinyl alcohol  1 drop Both Eyes BID     Assessment/ Plan:   1. Acute Kidney Injury - On CKD - diabetic nephropathy (baseline creatinine of 1.7). His AKI is thought to be ischemic ATN with retroperitoneal bleed/hemodynamic fluctuations with repeat injury from UTI/SIRS. Overall negative fluid balance with polyuric UOP in what appears to be ATN diuretic phase. Renal function continues  to improve (suspect BUN will be slower to fall with hematoma resorption)- continue to allow auto-diuresis and monitor electrolytes closely for volume depletion/wasting of potassium and magnesium. Mental status improving. Would start weaning off dopamine. Will DC foley over the next 24-48hrs  2. Pseudomonas UTI - positive culture 9/24, pan sensitive, on cefepime. Not bacteremic-Blood cx negative.  3. Normocytic Anemia - Likely secondary to acute blood loss from retroperitoneal bleed and blood loss from surgery. Currently on intravenous iron/subcutaneous ESA therapy.  4. Hyponatremia - due to impaired free water handling in the face of acute renal failure, sodium level now improving with diuretic phase of ATN/free water clearance.  5. Coronary artery disease: s/p 4 vessel CABG on 9/16. On metoprolol 12.5 mg BID. Remains heart rate controlled and with acceptable blood pressures. 6. Dyspnea: No evident pulmonary edema on exam- CXR done earlier this AM and awaiting evaluation for possible effusion   Zetta Bills, MD 03/22/2012, 7:44 AM
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
## 2012-03-22 NOTE — Progress Notes (Signed)
Physical Therapy Treatment Patient Details Name: [name] MRN: [id number] DOB: [birth year] Today's Date: 03/22/2012 Time: 4782-9562 PT Time Calculation (min): 39 min  PT Assessment / Plan / Recommendation Comments on Treatment Session  Pt s/p CABG.  Pt able to stand with Newman Nip today.  Slow progress.    Follow Up Recommendations  Post acute inpatient rehab    Barriers to Discharge        Equipment Recommendations  None recommended by PT    Recommendations for Other Services    Frequency Min 2X/week   Plan Discharge plan remains appropriate;Frequency remains appropriate    Precautions / Restrictions Precautions Precautions: Sternal;Fall   Pertinent Vitals/Pain VSS    Mobility  Transfers Sit to Stand: 1: +2 Total assist;With upper extremity assist;From chair/3-in-1 Sit to Stand: Patient Percentage:  (with Huntley Dec Plus) Stand to Sit: 1: +2 Total assist;With upper extremity assist;To chair/3-in-1 Stand to Sit: Patient Percentage:  (with Newman Nip) Details for Transfer Assistance: Stood x 5 with [employer].  Also required 2 person assist to provide extra boost with pad underneath him and assist to keep lt knee/foot postioned correctly on Sara Plus.    Exercises     PT Diagnosis:    PT Problem List:   PT Treatment Interventions:     PT Goals Acute Rehab PT Goals PT Goal: Sit to Stand - Progress: Progressing toward goal PT Goal: Stand to Sit - Progress: Progressing toward goal PT Goal: Stand - Progress: Progressing toward goal  Visit Information  Last PT Received On: 03/22/12 Assistance Needed: +2    Subjective Data  Subjective: "It's no use," pt stated expressing his frustration.   Cognition  Overall Cognitive Status: Difficult to assess Difficult to assess due to: Hard of hearing/deaf Arousal/Alertness: Awake/alert Behavior During Session: Restless Cognition - Other Comments: Pt several times reaching arms up in air and appearing to reach for something.      Balance  Static Standing Balance Static Standing - Balance Support: Bilateral upper extremity supported (on Sara Plus) Static Standing - Level of Assistance: 1: +2 Total assist (Sara Plus) Static Standing - Comment/# of Minutes: Stood x 5 with Huntley Dec Plus from 30-60 secs.  Pt with lt knee in hyperextension.  On initial stand lt foot began moving off of foot plate. Verbal/tactile cues to fully extend trunk and hips.  End of Session PT - End of Session Equipment Utilized During Treatment: Other (comment) Huntley Dec Plus) Activity Tolerance: Patient tolerated treatment well Patient left: in chair;with call bell/phone within reach;with family/visitor present;with nursing in room Nurse Communication: Mobility status;Need for lift equipment   GP     Akaash Vandewater 03/22/2012, 12:39 PM  [facility] PT [phone]

## 2012-03-22 NOTE — Progress Notes (Signed)
Nutrition Follow-up  Intervention:    Continue Glucerna Shake twice daily (220 kcals, 9.9 gm protein per 8 fl oz bottle) RD to follow for nutrition care plan  Assessment:   Patient's PO intake remains variable at 10-50% per flowsheet records. Per RN, he is drinking Glucerna Shakes. Renal function improving. Disposition: SNF placement.  Diet Order:  Carbohydrate Modified Medium Calorie  Meds: Scheduled Meds:   . amiodarone  200 mg Oral Daily  . aspirin EC  325 mg Oral Daily   Or  . aspirin  324 mg Per Tube Daily  . bisacodyl  10 mg Oral Daily   Or  . bisacodyl  10 mg Rectal Daily  . brimonidine  1 drop Left Eye BID  . ceFEPime (MAXIPIME) IV  1 g Intravenous Q24H  . citalopram  40 mg Oral Daily  . darbepoetin (ARANESP) injection - NON-DIALYSIS  200 mcg Subcutaneous Q Sun-1800  . docusate sodium  200 mg Oral Daily  . dorzolamide-timolol  1 drop Left Eye BID  . erythromycin  1 application Right Eye BID  . ezetimibe  10 mg Oral Daily  . feeding supplement  237 mL Oral BID BM  . insulin aspart  0-24 Units Subcutaneous Q4H  . insulin aspart  4 Units Subcutaneous TID WC  . insulin glargine  14 Units Subcutaneous BID  . ipratropium  1 spray Nasal BID  . loteprednol  1 drop Right Eye BID  . metoprolol tartrate  12.5 mg Oral BID   Or  . metoprolol tartrate  12.5 mg Per Tube BID  . pantoprazole  40 mg Oral Q1200  . polyvinyl alcohol  1 drop Both Eyes BID   Continuous Infusions:   . sodium chloride Stopped (03/22/12 1200)  . DISCONTD: DOPamine Stopped (03/22/12 1100)   PRN Meds:.albuterol, lactulose, levalbuterol, metoprolol, ondansetron (ZOFRAN) IV, oxyCODONE, sodium chloride  Labs:  CMP     Component Value Date/Time   NA 139 03/22/2012 0425   K 4.0 03/22/2012 0425   CL 102 03/22/2012 0425   CO2 27 03/22/2012 0425   GLUCOSE 183* 03/22/2012 0425   BUN 72* 03/22/2012 0425   CREATININE 2.02* 03/22/2012 0425   CALCIUM 9.4 03/22/2012 0425   PROT 6.0 03/22/2012 0425   ALBUMIN 2.5*  03/22/2012 0425   AST 25 03/22/2012 0425   ALT 49 03/22/2012 0425   ALKPHOS 110 03/22/2012 0425   BILITOT 0.8 03/22/2012 0425   GFRNONAA 31* 03/22/2012 0425   GFRAA 36* 03/22/2012 0425     Intake/Output Summary (Last 24 hours) at 03/22/12 1453 Last data filed at 03/22/12 1400  Gross per 24 hour  Intake 1135.8 ml  Output   4432 ml  Net -3296.2 ml    CBG (last 3)   Basename 03/22/12 1137 03/22/12 0723 03/22/12 0415  GLUCAP 177* 166* 181*    Weight Status:  116.5 kg (10/3) -- fluctuating   Estimated needs:  2100-2300 kcals, 110-120 gm protein  Nutrition Dx:  Inadequate Oral Intake r/t decreased appetite, improving  Goal:  Oral intake with meals & supplements to meet >/= 90% of estimated nutrition needs, unmet  Monitor:  PO & supplemental intake, weight, labs, I/O's  Kirkland Hun, RD, LDN Pager #: [phone] After-Hours Pager #: [phone]

## 2012-03-22 NOTE — Progress Notes (Signed)
TCTS BRIEF SICU PROGRESS NOTE  17 Days Post-Op  S/P Procedure(s) (LRB): CORONARY ARTERY BYPASS GRAFTING (CABG) (N/A)   Stable day  Plan: Continue current plan  Jeilani Grupe H 03/22/2012 5:43 PM

## 2012-03-22 NOTE — Plan of Care (Signed)
Problem: Phase I Progression Outcomes Goal: Other Phase I Outcomes/Goals Outcome: Progressing Dopamine gtt d/c'd today

## 2012-03-22 NOTE — Progress Notes (Addendum)
[address]            [place] [address]          [phone]     TCTS DAILY PROGRESS NOTE                   [address]            [place] [address]          [phone]      17 Days Post-Op Procedure(s) (LRB): CORONARY ARTERY BYPASS GRAFTING (CABG) (N/A)  Total Length of Stay:  LOS: 24 days   Subjective: Feels fair, remains significantly weak and deconditioned   Objective: Vital signs in last 24 hours: Temp:  [98.2 F (36.8 C)-98.9 F (37.2 C)] 98.2 F (36.8 C) (10/03 0724) Pulse Rate:  [69-96] 91  (10/03 0700) Cardiac Rhythm:  [-] Normal sinus rhythm (10/03 0000) Resp:  [14-22] 17  (10/03 0700) BP: (115-161)/(56-84) 161/81 mmHg (10/03 0600) SpO2:  [87 %-96 %] 96 % (10/03 0700) Weight:  [256 lb 13.4 oz (116.5 kg)] 256 lb 13.4 oz (116.5 kg) (10/03 0500)  Filed Weights   03/20/12 0600 03/21/12 0600 03/22/12 0500  Weight: 268 lb 15.4 oz (122 kg) 260 lb 5.8 oz (118.1 kg) 256 lb 13.4 oz (116.5 kg)    Weight change: -3 lb 8.4 oz (-1.6 kg)   Hemodynamic parameters for last 24 hours:    Intake/Output from previous day: 10/02 0701 - 10/03 0700 In: 1078 [P.O.:650; I.V.:378; IV Piggyback:50] Out: 4623 [Urine:4620; Stool:3]  Intake/Output this shift:    Current Meds: Scheduled Meds:   . amiodarone  200 mg Oral Daily  . aspirin EC  325 mg Oral Daily   Or  . aspirin  324 mg Per Tube Daily  . bisacodyl  10 mg Oral Daily   Or  . bisacodyl  10 mg Rectal Daily  . brimonidine  1 drop Left Eye BID  . ceFEPime (MAXIPIME) IV  1 g Intravenous Q24H  . darbepoetin (ARANESP) injection - NON-DIALYSIS  200 mcg Subcutaneous Q Sun-1800  . docusate sodium  200 mg Oral Daily  . dorzolamide-timolol  1 drop Left Eye BID  . erythromycin  1 application Right Eye BID  . ezetimibe  10 mg Oral Daily  . feeding supplement  237 mL Oral BID BM  . insulin aspart  0-24 Units Subcutaneous Q4H  . insulin aspart  4 Units Subcutaneous  TID WC  . insulin glargine  14 Units Subcutaneous BID  . ipratropium  1 spray Nasal BID  . loteprednol  1 drop Right Eye BID  . metoprolol tartrate  12.5 mg Oral BID   Or  . metoprolol tartrate  12.5 mg Per Tube BID  . pantoprazole  40 mg Oral Q1200  . polyvinyl alcohol  1 drop Both Eyes BID   Continuous Infusions:   . sodium chloride 10 mL/hr at 03/20/12 1500  . DOPamine 2.5 mcg/kg/min (03/22/12 0600)   PRN Meds:.albuterol, lactulose, levalbuterol, metoprolol, ondansetron (ZOFRAN) IV, oxyCODONE, sodium chloride  General appearance: alert, cooperative and no distress Heart: regular rate and rhythm Lungs: clear anteriorly Abdomen: soft, nontender, + BS Extremities: + edema Wound: incisions without signs of infection  Lab Results: CBC: Basename 03/22/12 0425  WBC 12.2*  HGB 10.3*  HCT 31.4*  PLT 199   BMET:  Basename 03/22/12 0425 03/21/12 0400  NA 139 141  K 4.0 3.8  CL 102 103  CO2 27 27  GLUCOSE 183* 158*  BUN 72* 95*  CREATININE 2.02* 2.46*  CALCIUM 9.4 9.4    PT/INR: No results found for this basename: LABPROT,INR in the last 72 hours Radiology: No results found.   Assessment/Plan: S/P Procedure(s) (LRB): CORONARY ARTERY BYPASS GRAFTING (CABG) (N/A)  1. CBG's adeq controlled 2 renal function conts to improve 3 H/H improved 4 foley still in, on maxipime 5 will need SNF. Poss tx to 2000 or 3300 soon  weaN OFF DOAPMINE    [name] MD  Beeper [phone] Office [phone] 03/22/2012 11:37 AM

## 2012-03-23 ENCOUNTER — Inpatient Hospital Stay (HOSPITAL_COMMUNITY): Payer: Medicare Other

## 2012-03-23 LAB — BASIC METABOLIC PANEL
BUN: 60 mg/dL — ABNORMAL HIGH (ref 6–23)
CO2: 29 mEq/L (ref 19–32)
Calcium: 9.4 mg/dL (ref 8.4–10.5)
Chloride: 103 mEq/L (ref 96–112)
Creatinine, Ser: 1.93 mg/dL — ABNORMAL HIGH (ref 0.50–1.35)
GFR calc Af Amer: 38 mL/min — ABNORMAL LOW (ref >90)
GFR calc non Af Amer: 33 mL/min — ABNORMAL LOW (ref >90)
Glucose, Bld: 116 mg/dL — ABNORMAL HIGH (ref 70–99)
Potassium: 3.7 mEq/L (ref 3.5–5.1)
Sodium: 140 mEq/L (ref 135–145)

## 2012-03-23 LAB — CBC
HCT: 30.5 % — ABNORMAL LOW (ref 39.0–52.0)
Hemoglobin: 9.6 g/dL — ABNORMAL LOW (ref 13.0–17.0)
MCH: 28.9 pg (ref 26.0–34.0)
MCHC: 31.5 g/dL (ref 30.0–36.0)
MCV: 91.9 fL (ref 78.0–100.0)
Platelets: 176 10*3/uL (ref 150–400)
RBC: 3.32 MIL/uL — ABNORMAL LOW (ref 4.22–5.81)
RDW: 17 % — ABNORMAL HIGH (ref 11.5–15.5)
WBC: 12.8 10*3/uL — ABNORMAL HIGH (ref 4.0–10.5)

## 2012-03-23 LAB — GLUCOSE, CAPILLARY
Glucose-Capillary: 109 mg/dL — ABNORMAL HIGH (ref 70–99)
Glucose-Capillary: 179 mg/dL — ABNORMAL HIGH (ref 70–99)
Glucose-Capillary: 187 mg/dL — ABNORMAL HIGH (ref 70–99)
Glucose-Capillary: 116 mg/dL — ABNORMAL HIGH (ref 70–99)

## 2012-03-23 MED ORDER — CITALOPRAM HYDROBROMIDE 20 MG PO TABS
20.0000 mg | ORAL_TABLET | Freq: Every day | ORAL | Status: DC
  Administered 2012-03-23 – 2012-03-27 (×5): 20 mg via ORAL
  Filled 2012-03-23 (×5): qty 1

## 2012-03-23 NOTE — Progress Notes (Signed)
Patient ID: [name], male   DOB: 01-Oct-1940, 71 y.o.   MRN: [id number]   [name] Progress Note    Subjective:   Had a comfortable night- some restlessness this AM. Unable to control bowels/continence and still struggling with deconditioning.   Objective:   BP 137/70  Pulse 83  Temp 98.5 F (36.9 C) (Oral)  Resp 17  Ht 5\' 10"  (1.778 m)  Wt 116.4 kg (256 lb 9.9 oz)  BMI 36.82 kg/m2  SpO2 92%  Intake/Output Summary (Last 24 hours) at 03/23/12 0755 Last data filed at 03/23/12 0700  Gross per 24 hour  Intake 1253.8 ml  Output   2548 ml  Net -1294.2 ml   Weight change: -0.1 kg (-3.5 oz)  Physical Exam: NWG:NFAOZHYQMVH sleeping in recliner QIO:NGEXB RRR, normal S1 and S2  Resp:Coarse BS bilaterally, no rales/rhonchi MWU:XLKG, obese, NT, BS normal MWN:UUVOZ ankle edema  Imaging: Dg Chest Port 1 View  03/22/2012  *RADIOLOGY REPORT*  Clinical Data: Chest pain.  Shortness of breath.  PORTABLE CHEST - 1 VIEW  Comparison: Plain film chest 03/17/2012.  Findings: Left PICC remains in place.  Airspace disease in left mid and lower lung zones persists.  Aeration in the left upper lobe has improved.  Right lung clear.  Heart size is enlarged.  The patient is status post CABG.  IMPRESSION: Persistent left sided airspace disease and effusion appears improved in the upper lobe.   Original Report Authenticated By: [name], M.D.     Labs: BMET  Lab 03/23/12 0437 03/22/12 0425 03/21/12 0400 03/20/12 1600 03/20/12 0459 03/19/12 0357 03/18/12 0400  NA 140 139 141 136 137 132* 131*  K 3.7 4.0 3.8 3.9 4.0 3.8 4.0  CL 103 102 103 97 99 94* 92*  CO2 29 27 27 26 25 24 22   GLUCOSE 116* 183* 158* 252* 122* 116* 51*  BUN 60* 72* 95* 105* 112* 125* 132*  CREATININE 1.93* 2.02* 2.46* 2.67* 3.01* 3.71* 4.29*  ALB -- -- -- -- -- -- --  CALCIUM 9.4 9.4 9.4 8.8 9.1 8.9 8.6  PHOS -- -- -- 4.3 -- 6.9* --   CBC  Lab 03/23/12 0437 03/22/12 0425 03/19/12 0357  03/18/12 0400 03/17/12 0350  WBC 12.8* 12.2* 13.6* 19.4* --  NEUTROABS -- -- -- -- 20.3*  HGB 9.6* 10.3* 9.5* 9.1* --  HCT 30.5* 31.4* 28.2* 27.1* --  MCV 91.9 89.5 87.6 86.0 --  PLT 176 199 233 246 --    Medications:      . amiodarone  200 mg Oral Daily  . aspirin EC  325 mg Oral Daily   Or  . aspirin  324 mg Per Tube Daily  . bisacodyl  10 mg Oral Daily   Or  . bisacodyl  10 mg Rectal Daily  . brimonidine  1 drop Left Eye BID  . ceFEPime (MAXIPIME) IV  1 g Intravenous Q24H  . citalopram  40 mg Oral Daily  . darbepoetin (ARANESP) injection - NON-DIALYSIS  200 mcg Subcutaneous Q Sun-1800  . docusate sodium  200 mg Oral Daily  . dorzolamide-timolol  1 drop Left Eye BID  . erythromycin  1 application Right Eye BID  . ezetimibe  10 mg Oral Daily  . feeding supplement  237 mL Oral BID BM  . insulin aspart  0-24 Units Subcutaneous Q4H  . insulin aspart  4 Units Subcutaneous TID WC  . insulin glargine  14 Units Subcutaneous BID  . ipratropium  1 spray Nasal BID  . loteprednol  1 drop Right Eye BID  . metoprolol tartrate  12.5 mg Oral BID   Or  . metoprolol tartrate  12.5 mg Per Tube BID  . pantoprazole  40 mg Oral Q1200  . polyvinyl alcohol  1 drop Both Eyes BID     Assessment/ Plan:   1. Acute Kidney Injury - On CKD - diabetic nephropathy (baseline creatinine of 1.7). His AKI is thought to be ischemic ATN with retroperitoneal bleed/hemodynamic fluctuations with repeat injury from UTI/SIRS. With a negative fluid balance and good (now normal from polyuric) UOP. Renal function continues to improve (suspect BUN will be slower to fall with hematoma resorption)- continue off diuretics unless UOP falls <1.5L/24hr. Mental status improving. Would start weaning off dopamine. Will DC foley today and order a condom catheter due to difficulty with mobility/continence. Monitor closely for retention.  2. Pseudomonas UTI - positive culture 9/24, pan sensitive, on cefepime. Not bacteremic-Blood  cx negative.  3. Normocytic Anemia - Likely secondary to acute blood loss from retroperitoneal bleed and blood loss from surgery. Currently on intravenous iron/subcutaneous ESA therapy.  4. Hyponatremia - due to impaired free water handling in the face of acute renal failure, sodium level now improving with diuretic phase of ATN/free water clearance.  5. Coronary artery disease: s/p 4 vessel CABG on 9/16. On metoprolol 12.5 mg BID. Remains heart rate controlled and with acceptable blood pressures.    Zetta Bills, MD 03/23/2012, 7:55 AM

## 2012-03-23 NOTE — Progress Notes (Signed)
Clinical Social Work-CSW continues to follow-pt wife has bed offers and CSW will facilitate d/c plan-if SNF appropriate-as soon as pt medically cleared- Jodean Lima, [phone]

## 2012-03-23 NOTE — Progress Notes (Signed)
Physical Therapy Treatment Patient Details Name: [name] MRN: [id number] DOB: 01-Mar-1941 Today's Date: 03/23/2012 Time: 8469-6295 PT Time Calculation (min): 25 min  PT Assessment / Plan / Recommendation Comments on Treatment Session  Pt s/p CABG.  Pt continues to make slow but steady progress.    Follow Up Recommendations  Post acute inpatient rehab    Barriers to Discharge        Equipment Recommendations  None recommended by PT    Recommendations for Other Services    Frequency Min 2X/week   Plan Discharge plan remains appropriate;Frequency remains appropriate    Precautions / Restrictions Precautions Precautions: Sternal;Fall   Pertinent Vitals/Pain VSS    Mobility  Bed Mobility Supine to Sit: 3: Mod assist;HOB elevated Sitting - Scoot to Edge of Bed: 4: Min assist Sit to Supine: 1: +2 Total assist Sit to Supine: Patient Percentage: 50% Details for Bed Mobility Assistance: assist to bring trunk up to sitting.  Assist to control trunk and bring legs up when returning to supine. Transfers Sit to Stand: 1: +2 Total assist;From bed (with Huntley Dec plus) Stand to Sit: 1: +2 Total assist;To bed (with Newman Nip) Transfer via Lift Equipment: Kandee Keen Details for Transfer Assistance: Stood x 3 with [employer].  Removed foot plate from Huntley Dec Plus to allow wider base of support.     Exercises     PT Diagnosis:    PT Problem List:   PT Treatment Interventions:     PT Goals Acute Rehab PT Goals Pt will go Supine/Side to Sit: with min assist PT Goal: Supine/Side to Sit - Progress: Updated due to goal met Pt will go Sit to Supine/Side: with mod assist PT Goal: Sit to Supine/Side - Progress: Updated due to goal met PT Goal: Sit to Stand - Progress: Progressing toward goal PT Goal: Stand to Sit - Progress: Progressing toward goal PT Transfer Goal: Bed to Chair/Chair to Bed - Progress: Progressing toward goal  Visit Information  Last PT Received On: 03/23/12 Assistance  Needed: +2    Subjective Data  Subjective: "My hearing aide isn't working."   Cognition  Overall Cognitive Status: Difficult to assess Difficult to assess due to: Hard of hearing/deaf Arousal/Alertness: Awake/alert Behavior During Session: [facility] for tasks performed    Balance  Static Standing Balance Static Standing - Balance Support: Bilateral upper extremity supported (on Sara Plus) Static Standing - Level of Assistance: 1: +2 Total assist;Other (comment) (Sara Plus) Static Standing - Comment/# of Minutes: Stood x 3 for 1-2 minutes with Huntley Dec Plus.  Decr lt knee control with knee in hyperextension or flexion.  Cues to extend trunk and hips.  End of Session PT - End of Session Equipment Utilized During Treatment: Other (comment) Huntley Dec Plus) Activity Tolerance: Patient tolerated treatment well Patient left: in bed;with call bell/phone within reach Nurse Communication: Mobility status;Need for lift equipment   GP     Lashia Niese 03/23/2012, 3:17 PM  [facility] PT [phone]

## 2012-03-23 NOTE — Progress Notes (Signed)
Patient ID: [name], male   DOB: 1940/08/26, 71 y.o.   MRN: [id number] TCTS DAILY PROGRESS NOTE                   [address]            [place] [address]          [phone]      18 Days Post-Op Procedure(s) (LRB): CORONARY ARTERY BYPASS GRAFTING (CABG) (N/A)  Total Length of Stay:  LOS: 25 days   Subjective: Stable yesterday, but not able to do much  Objective: Vital signs in last 24 hours: Temp:  [98.4 F (36.9 C)-98.7 F (37.1 C)] 98.5 F (36.9 C) (10/04 0745) Pulse Rate:  [73-106] 83  (10/04 0700) Cardiac Rhythm:  [-] Normal sinus rhythm (10/03 2000) Resp:  [16-30] 17  (10/04 0700) BP: (117-160)/(50-110) 137/70 mmHg (10/04 0600) SpO2:  [88 %-97 %] 92 % (10/04 0700) Weight:  [256 lb 9.9 oz (116.4 kg)] 256 lb 9.9 oz (116.4 kg) (10/04 0500)  Filed Weights   03/21/12 0600 03/22/12 0500 03/23/12 0500  Weight: 260 lb 5.8 oz (118.1 kg) 256 lb 13.4 oz (116.5 kg) 256 lb 9.9 oz (116.4 kg)    Weight change: -3.5 oz (-0.1 kg)   Hemodynamic parameters for last 24 hours:    Intake/Output from previous day: 10/03 0701 - 10/04 0700 In: 1253.8 [P.O.:1137; I.V.:66.8; IV Piggyback:50] Out: 2548 [Urine:2545; Stool:3]  Intake/Output this shift:    Current Meds: Scheduled Meds:   . amiodarone  200 mg Oral Daily  . aspirin EC  325 mg Oral Daily   Or  . aspirin  324 mg Per Tube Daily  . bisacodyl  10 mg Oral Daily   Or  . bisacodyl  10 mg Rectal Daily  . brimonidine  1 drop Left Eye BID  . ceFEPime (MAXIPIME) IV  1 g Intravenous Q24H  . citalopram  20 mg Oral Daily  . darbepoetin (ARANESP) injection - NON-DIALYSIS  200 mcg Subcutaneous Q Sun-1800  . docusate sodium  200 mg Oral Daily  . dorzolamide-timolol  1 drop Left Eye BID  . erythromycin  1 application Right Eye BID  . ezetimibe  10 mg Oral Daily  . feeding supplement  237 mL Oral BID BM  . insulin aspart  0-24 Units Subcutaneous Q4H  . insulin aspart  4 Units Subcutaneous TID WC  .  insulin glargine  14 Units Subcutaneous BID  . ipratropium  1 spray Nasal BID  . loteprednol  1 drop Right Eye BID  . metoprolol tartrate  12.5 mg Oral BID   Or  . metoprolol tartrate  12.5 mg Per Tube BID  . pantoprazole  40 mg Oral Q1200  . polyvinyl alcohol  1 drop Both Eyes BID  . DISCONTD: citalopram  40 mg Oral Daily   Continuous Infusions:   . sodium chloride Stopped (03/22/12 1200)  . DISCONTD: DOPamine Stopped (03/22/12 1100)   PRN Meds:.albuterol, lactulose, levalbuterol, metoprolol, ondansetron (ZOFRAN) IV, oxyCODONE, sodium chloride  General appearance: alert and cooperative Neurologic: intact Heart: regular rate and rhythm, S1, S2 normal, no murmur, click, rub or gallop and normal apical impulse Lungs: diminished breath sounds bibasilar Abdomen: soft, non-tender; bowel sounds normal; no masses,  no organomegaly Extremities: extremities normal, atraumatic, no cyanosis or edema and Homans sign is negative, no sign of DVT Wound: sternum stable without infection  Lab Results: CBC: Basename 03/23/12 0437 03/22/12 0425  WBC 12.8* 12.2*  HGB 9.6* 10.3*  HCT 30.5* 31.4*  PLT 176 199   BMET:  Basename 03/23/12 0437 03/22/12 0425  NA 140 139  K 3.7 4.0  CL 103 102  CO2 29 27  GLUCOSE 116* 183*  BUN 60* 72*  CREATININE 1.93* 2.02*  CALCIUM 9.4 9.4    PT/INR: No results found for this basename: LABPROT,INR in the last 72 hours Radiology: Dg Chest Port 1 View  03/23/2012  *RADIOLOGY REPORT*  Clinical Data: Post CABG, chest tube removal  PORTABLE CHEST - 1 VIEW  Comparison: Portable chest x-ray of 03/22/2012  Findings:  The lungs are not as well aerated and is more haziness overlying the left hemithorax probably due to atelectasis and layering effusion.  Cardiomegaly is stable.  The left central venous line tip remains overlying the lower SVC.  IMPRESSION: Slightly diminished aeration.  Haziness over the left hemithorax may indicate atelectasis and effusion.   Original  Report Authenticated By: Juline Patch, M.D.    Dg Chest Port 1 View  03/22/2012  *RADIOLOGY REPORT*  Clinical Data: Chest pain.  Shortness of breath.  PORTABLE CHEST - 1 VIEW  Comparison: Plain film chest 03/17/2012.  Findings: Left PICC remains in place.  Airspace disease in left mid and lower lung zones persists.  Aeration in the left upper lobe has improved.  Right lung clear.  Heart size is enlarged.  The patient is status post CABG.  IMPRESSION: Persistent left sided airspace disease and effusion appears improved in the upper lobe.   Original Report Authenticated By: Bernadene Bell. Maricela Curet, M.D.      Assessment/Plan: S/P Procedure(s) (LRB): CORONARY ARTERY BYPASS GRAFTING (CABG) (N/A) Mobilize Diabetes control will need plan for after hospital care reaching point to find options, turned down by [facility] and in patient rehab     Maybel Dambrosio B 03/23/2012 8:19 AM

## 2012-03-23 NOTE — Progress Notes (Signed)
Inpatient Diabetes Program Recommendations  AACE/ADA: New Consensus Statement on Inpatient Glycemic Control (2013)  Target Ranges:  Prepandial:   less than 140 mg/dL      Peak postprandial:   less than 180 mg/dL (1-2 hours)      Critically ill patients:  140 - 180 mg/dL    Results for RIVEN, MABILE (MRN [id number]) as of 03/23/2012 10:44  Ref. Range 03/22/2012 00:09 03/22/2012 04:15 03/22/2012 07:23 03/22/2012 11:37 03/22/2012 15:48 03/22/2012 19:44  Glucose-Capillary Latest Range: 70-99 mg/dL 409 (H) 811 (H) 914 (H) 177 (H) 243 (H) 240 (H)    Results for BRODIN, GELPI (MRN [id number]) as of 03/23/2012 10:44  Ref. Range 03/22/2012 23:35 03/23/2012 04:35 03/23/2012 07:46  Glucose-Capillary Latest Range: 70-99 mg/dL 086 (H) 578 (H) 469 (H)    Inpatient Diabetes Program Recommendations Insulin - Basal: Please consider increasing Lantus to 18 units bid. Correction (SSI): Please change Novolog SSI to ac + HS (currently ordered Q4 hours and patient is eating PO diet). Insulin - Meal Coverage: Noted patient currently on Novolog 4 units tid with meals- Please continue.  Note: Will follow. Ambrose Finland RN, MSN, CDE Diabetes Coordinator Inpatient Diabetes Program [phone]

## 2012-03-23 NOTE — Progress Notes (Signed)
Rehab admissions - Evaluated for possible admission.  Please see rehab consult done by Dr. Wynn Banker recommending SNF level therapies.  Agree with need for SNF.  #[phone]

## 2012-03-23 NOTE — Progress Notes (Signed)
Patient ID: [name], male   DOB: 1940/09/23, 71 y.o.   MRN: [id number]                   [address]            [place] [address]          [phone]     18 Days Post-Op Procedure(s) (LRB): CORONARY ARTERY BYPASS GRAFTING (CABG) (N/A)  Total Length of Stay:  LOS: 25 days  BP 135/62  Pulse 78  Temp 98.5 F (36.9 C) (Oral)  Resp 21  Ht 5\' 10"  (1.778 m)  Wt 256 lb 9.9 oz (116.4 kg)  BMI 36.82 kg/m2  SpO2 94%     . sodium chloride Stopped (03/22/12 1200)     Lab Results  Component Value Date   WBC 12.8* 03/23/2012   HGB 9.6* 03/23/2012   HCT 30.5* 03/23/2012   PLT 176 03/23/2012   GLUCOSE 116* 03/23/2012   ALT 49 03/22/2012   AST 25 03/22/2012   NA 140 03/23/2012   K 3.7 03/23/2012   CL 103 03/23/2012   CREATININE 1.93* 03/23/2012   BUN 60* 03/23/2012   CO2 29 03/23/2012   TSH 4.185 03/15/2012   INR 1.47 03/05/2012   Developed urinary retention foley replaced,   [name] MD  Beeper [phone] Office [phone] 03/23/2012 7:34 PM

## 2012-03-24 LAB — GLUCOSE, CAPILLARY
Glucose-Capillary: 119 mg/dL — ABNORMAL HIGH (ref 70–99)
Glucose-Capillary: 141 mg/dL — ABNORMAL HIGH (ref 70–99)
Glucose-Capillary: 143 mg/dL — ABNORMAL HIGH (ref 70–99)

## 2012-03-24 LAB — CBC
HCT: 30.3 % — ABNORMAL LOW (ref 39.0–52.0)
Hemoglobin: 9.5 g/dL — ABNORMAL LOW (ref 13.0–17.0)
RDW: 17.5 % — ABNORMAL HIGH (ref 11.5–15.5)
WBC: 12.5 10*3/uL — ABNORMAL HIGH (ref 4.0–10.5)

## 2012-03-24 LAB — RENAL FUNCTION PANEL
Albumin: 2.5 g/dL — ABNORMAL LOW (ref 3.5–5.2)
BUN: 51 mg/dL — ABNORMAL HIGH (ref 6–23)
Chloride: 104 mEq/L (ref 96–112)
GFR calc Af Amer: 41 mL/min — ABNORMAL LOW (ref >90)
Glucose, Bld: 145 mg/dL — ABNORMAL HIGH (ref 70–99)
Potassium: 4 mEq/L (ref 3.5–5.1)

## 2012-03-24 MED ORDER — TAMSULOSIN HCL 0.4 MG PO CAPS
0.4000 mg | ORAL_CAPSULE | Freq: Every day | ORAL | Status: DC
  Administered 2012-03-24 – 2012-03-26 (×3): 0.4 mg via ORAL
  Filled 2012-03-24 (×4): qty 1

## 2012-03-24 NOTE — Progress Notes (Signed)
Patient ID: [name], male   DOB: [birth year], 71 y.o.   MRN: [id number]                   [address]            [place] [address]          [phone]     19 Days Post-Op Procedure(s) (LRB): CORONARY ARTERY BYPASS GRAFTING (CABG) (N/A)  Total Length of Stay:  LOS: 26 days  BP 131/58  Pulse 74  Temp 98.5 F (36.9 C) (Oral)  Resp 18  Ht 5\' 10"  (1.778 m)  Wt 256 lb 9.9 oz (116.4 kg)  BMI 36.82 kg/m2  SpO2 97%  .Intake/Output      10/05 0701 - 10/06 0700   P.O. 737   IV Piggyback 50   Total Intake(mL/kg) 787 (6.8)   Urine (mL/kg/hr) 745 (0.5)   Total Output 745   Net +42            . sodium chloride Stopped (03/22/12 1200)     Lab Results  Component Value Date   WBC 12.5* 03/24/2012   HGB 9.5* 03/24/2012   HCT 30.3* 03/24/2012   PLT 156 03/24/2012   GLUCOSE 145* 03/24/2012   ALT 49 03/22/2012   AST 25 03/22/2012   NA 140 03/24/2012   K 4.0 03/24/2012   CL 104 03/24/2012   CREATININE 1.80* 03/24/2012   BUN 51* 03/24/2012   CO2 27 03/24/2012   TSH 4.185 03/15/2012   INR 1.47 03/05/2012   Stable day  [name] MD  Beeper [phone] Office [phone] 03/24/2012 7:01 PM

## 2012-03-24 NOTE — Progress Notes (Signed)
Patient ID: [name], male   DOB: June 17, 1941, 71 y.o.   MRN: [id number]   [facility] Progress Note    Subjective:   Had urinary retention last night after discontinuation of foley catheter- foley consequently replaced. No other acute issues noted overnight. Placement pending- will likely go to SNF   Objective:   BP 139/68  Pulse 73  Temp 98.2 F (36.8 C) (Oral)  Resp 16  Ht 5\' 10"  (1.778 m)  Wt 116.4 kg (256 lb 9.9 oz)  BMI 36.82 kg/m2  SpO2 93%  Intake/Output Summary (Last 24 hours) at 03/24/12 0756 Last data filed at 03/24/12 0600  Gross per 24 hour  Intake    410 ml  Output   1865 ml  Net  -1455 ml   Weight change: 0 kg (0 lb)  Physical Exam: WUJ:WJXBJYNWGNF sitting OOB in recliner AOZ:HYQMV RRR, normal S1 and S2  Resp:Decreased BS left base, otherwise coarse BS bilaterally HQI:ONGE, obese, NT, BS normal XBM:WUXLK ankle edema  Imaging: Dg Chest Port 1 View  03/23/2012  *RADIOLOGY REPORT*  Clinical Data: Post CABG, chest tube removal  PORTABLE CHEST - 1 VIEW  Comparison: Portable chest x-ray of 03/22/2012  Findings:  The lungs are not as well aerated and is more haziness overlying the left hemithorax probably due to atelectasis and layering effusion.  Cardiomegaly is stable.  The left central venous line tip remains overlying the lower SVC.  IMPRESSION: Slightly diminished aeration.  Haziness over the left hemithorax may indicate atelectasis and effusion.   Original Report Authenticated By: Juline Patch, M.D.     Labs: BMET  Lab 03/24/12 0345 03/23/12 0437 03/22/12 0425 03/21/12 0400 03/20/12 1600 03/20/12 0459 03/19/12 0357  NA 140 140 139 141 136 137 132*  K 4.0 3.7 4.0 3.8 3.9 4.0 3.8  CL 104 103 102 103 97 99 94*  CO2 27 29 27 27 26 25 24   GLUCOSE 145* 116* 183* 158* 252* 122* 116*  BUN 51* 60* 72* 95* 105* 112* 125*  CREATININE 1.80* 1.93* 2.02* 2.46* 2.67* 3.01* 3.71*  ALB -- -- -- -- -- -- --  CALCIUM 9.2 9.4 9.4 9.4 8.8 9.1 8.9    PHOS 3.6 -- -- -- 4.3 -- 6.9*   CBC  Lab 03/24/12 0345 03/23/12 0437 03/22/12 0425 03/19/12 0357  WBC 12.5* 12.8* 12.2* 13.6*  NEUTROABS -- -- -- --  HGB 9.5* 9.6* 10.3* 9.5*  HCT 30.3* 30.5* 31.4* 28.2*  MCV 92.9 91.9 89.5 87.6  PLT 156 176 199 233    Medications:      . amiodarone  200 mg Oral Daily  . aspirin EC  325 mg Oral Daily   Or  . aspirin  324 mg Per Tube Daily  . bisacodyl  10 mg Oral Daily   Or  . bisacodyl  10 mg Rectal Daily  . brimonidine  1 drop Left Eye BID  . ceFEPime (MAXIPIME) IV  1 g Intravenous Q24H  . citalopram  20 mg Oral Daily  . darbepoetin (ARANESP) injection - NON-DIALYSIS  200 mcg Subcutaneous Q Sun-1800  . docusate sodium  200 mg Oral Daily  . dorzolamide-timolol  1 drop Left Eye BID  . erythromycin  1 application Right Eye BID  . ezetimibe  10 mg Oral Daily  . feeding supplement  237 mL Oral BID BM  . insulin aspart  0-24 Units Subcutaneous Q4H  . insulin aspart  4 Units Subcutaneous TID WC  . insulin glargine  14 Units Subcutaneous BID  . ipratropium  1 spray Nasal BID  . loteprednol  1 drop Right Eye BID  . metoprolol tartrate  12.5 mg Oral BID   Or  . metoprolol tartrate  12.5 mg Per Tube BID  . pantoprazole  40 mg Oral Q1200  . polyvinyl alcohol  1 drop Both Eyes BID  . DISCONTD: citalopram  40 mg Oral Daily     Assessment/ Plan:   1. Acute Kidney Injury on CKD - diabetic nephropathy (baseline creatinine of 1.7). His AKI is thought to be ischemic ATN with retroperitoneal bleed/hemodynamic fluctuations with repeat injury from UTI/SIRS. Renal function continues to improve with a satisfactory UOP. (suspect BUN will be slower to fall with hematoma resorption)- continue to leave him off diuretics unless UOP falls <1.5L/24hr. Mental status improving. Will start on tamsulosin QHS and plan on DC foley thereafter 2. Pseudomonas UTI - positive culture 9/24, pan sensitive, on cefepime. Not bacteremic-Blood cx negative.  3. Normocytic Anemia  - Likely secondary to acute blood loss from retroperitoneal bleed and blood loss from surgery. Currently on intravenous iron/subcutaneous ESA therapy.  4. Coronary artery disease: s/p 4 vessel CABG on 9/16. On metoprolol 12.5 mg BID. Remains heart rate controlled and with acceptable blood pressures.    Zetta Bills, MD 03/24/2012, 7:56 AM

## 2012-03-24 NOTE — Progress Notes (Signed)
Patient ID: [name], male   DOB: Mar 21, 1941, 71 y.o.   MRN: [id number] TCTS DAILY PROGRESS NOTE                   [address]            [place] [id number]          [phone]      19 Days Post-Op Procedure(s) (LRB): CORONARY ARTERY BYPASS GRAFTING (CABG) (N/A)  Total Length of Stay:  LOS: 26 days   Subjective: Alert feeding self  Objective: Vital signs in last 24 hours: Temp:  [97.6 F (36.4 C)-98.7 F (37.1 C)] 98.2 F (36.8 C) (10/05 0715) Pulse Rate:  [71-89] 89  (10/05 0900) Cardiac Rhythm:  [-] Normal sinus rhythm (10/05 0900) Resp:  [16-24] 19  (10/05 0900) BP: (115-161)/(53-101) 125/56 mmHg (10/05 0900) SpO2:  [92 %-96 %] 93 % (10/05 0900) Weight:  [256 lb 9.9 oz (116.4 kg)] 256 lb 9.9 oz (116.4 kg) (10/05 0500)  Filed Weights   03/22/12 0500 03/23/12 0500 03/24/12 0500  Weight: 256 lb 13.4 oz (116.5 kg) 256 lb 9.9 oz (116.4 kg) 256 lb 9.9 oz (116.4 kg)    Weight change: 0 lb (0 kg)   Hemodynamic parameters for last 24 hours:    Intake/Output from previous day: 10/04 0701 - 10/05 0700 In: 410 [P.O.:360; IV Piggyback:50] Out: 1865 [Urine:1865]  Intake/Output this shift: Total I/O In: 200 [P.O.:200] Out: 200 [Urine:200]  Current Meds: Scheduled Meds:   . amiodarone  200 mg Oral Daily  . aspirin EC  325 mg Oral Daily   Or  . aspirin  324 mg Per Tube Daily  . bisacodyl  10 mg Oral Daily   Or  . bisacodyl  10 mg Rectal Daily  . brimonidine  1 drop Left Eye BID  . ceFEPime (MAXIPIME) IV  1 g Intravenous Q24H  . citalopram  20 mg Oral Daily  . darbepoetin (ARANESP) injection - NON-DIALYSIS  200 mcg Subcutaneous Q Sun-1800  . docusate sodium  200 mg Oral Daily  . dorzolamide-timolol  1 drop Left Eye BID  . erythromycin  1 application Right Eye BID  . ezetimibe  10 mg Oral Daily  . feeding supplement  237 mL Oral BID BM  . insulin aspart  0-24 Units Subcutaneous Q4H  . insulin aspart  4 Units Subcutaneous TID WC  .  insulin glargine  14 Units Subcutaneous BID  . ipratropium  1 spray Nasal BID  . loteprednol  1 drop Right Eye BID  . metoprolol tartrate  12.5 mg Oral BID   Or  . metoprolol tartrate  12.5 mg Per Tube BID  . pantoprazole  40 mg Oral Q1200  . polyvinyl alcohol  1 drop Both Eyes BID  . Tamsulosin HCl  0.4 mg Oral QPC supper   Continuous Infusions:   . sodium chloride Stopped (03/22/12 1200)   PRN Meds:.albuterol, lactulose, levalbuterol, metoprolol, ondansetron (ZOFRAN) IV, oxyCODONE, sodium chloride  General appearance: alert, cooperative, appears older than stated age and no distress Neurologic: intact Heart: regular rate and rhythm, S1, S2 normal, no murmur, click, rub or gallop Lungs: diminished breath sounds bibasilar Abdomen: soft, non-tender; bowel sounds normal; no masses,  no organomegaly Extremities: extremities normal, atraumatic, no cyanosis or edema, Homans sign is negative, no sign of DVT and wounds do not appear infected   Lab Results: CBC: Basename 03/24/12 0345 03/23/12 0437  WBC 12.5* 12.8*  HGB 9.5* 9.6*  HCT 30.3* 30.5*  PLT 156 176   BMET:  Basename 03/24/12 0345 03/23/12 0437  NA 140 140  K 4.0 3.7  CL 104 103  CO2 27 29  GLUCOSE 145* 116*  BUN 51* 60*  CREATININE 1.80* 1.93*  CALCIUM 9.2 9.4    PT/INR: No results found for this basename: LABPROT,INR in the last 72 hours Radiology: Dg Chest Port 1 View  03/23/2012  *RADIOLOGY REPORT*  Clinical Data: Post CABG, chest tube removal  PORTABLE CHEST - 1 VIEW  Comparison: Portable chest x-ray of 03/22/2012  Findings:  The lungs are not as well aerated and is more haziness overlying the left hemithorax probably due to atelectasis and layering effusion.  Cardiomegaly is stable.  The left central venous line tip remains overlying the lower SVC.  IMPRESSION: Slightly diminished aeration.  Haziness over the left hemithorax may indicate atelectasis and effusion.   Original Report Authenticated By: Juline Patch,  M.D.      Assessment/Plan: S/P Procedure(s) (LRB): CORONARY ARTERY BYPASS GRAFTING (CABG) (N/A) Acute urinary retention with foley replaced last pm for greater then 600 ml residual Needs long term care plan, stabilizing from acute issues     [employer] Null B 03/24/2012 9:49 AM

## 2012-03-25 ENCOUNTER — Inpatient Hospital Stay (HOSPITAL_COMMUNITY): Payer: Medicare Other

## 2012-03-25 LAB — URINALYSIS, ROUTINE W REFLEX MICROSCOPIC
Bilirubin Urine: NEGATIVE
Glucose, UA: 100 mg/dL — AB
Ketones, ur: NEGATIVE mg/dL
Nitrite: NEGATIVE
Protein, ur: >300 mg/dL — AB
Specific Gravity, Urine: 1.015 (ref 1.005–1.030)
Urobilinogen, UA: 1 mg/dL (ref 0.0–1.0)
pH: 5 (ref 5.0–8.0)

## 2012-03-25 LAB — RENAL FUNCTION PANEL
BUN: 48 mg/dL — ABNORMAL HIGH (ref 6–23)
CO2: 28 mEq/L (ref 19–32)
Calcium: 9.2 mg/dL (ref 8.4–10.5)
Creatinine, Ser: 1.84 mg/dL — ABNORMAL HIGH (ref 0.50–1.35)
GFR calc Af Amer: 40 mL/min — ABNORMAL LOW (ref >90)
Glucose, Bld: 141 mg/dL — ABNORMAL HIGH (ref 70–99)
Phosphorus: 3.6 mg/dL (ref 2.3–4.6)
Sodium: 138 mEq/L (ref 135–145)

## 2012-03-25 LAB — GLUCOSE, CAPILLARY
Glucose-Capillary: 135 mg/dL — ABNORMAL HIGH (ref 70–99)
Glucose-Capillary: 163 mg/dL — ABNORMAL HIGH (ref 70–99)
Glucose-Capillary: 263 mg/dL — ABNORMAL HIGH (ref 70–99)
Glucose-Capillary: 272 mg/dL — ABNORMAL HIGH (ref 70–99)

## 2012-03-25 LAB — CBC
HCT: 28.9 % — ABNORMAL LOW (ref 39.0–52.0)
Hemoglobin: 9.1 g/dL — ABNORMAL LOW (ref 13.0–17.0)
MCH: 29.4 pg (ref 26.0–34.0)
RBC: 3.09 MIL/uL — ABNORMAL LOW (ref 4.22–5.81)

## 2012-03-25 LAB — URINE MICROSCOPIC-ADD ON

## 2012-03-25 NOTE — Progress Notes (Signed)
Patient ID: [name], male   DOB: [birth year], 71 y.o.   MRN: [id number]   [facility] Progress Note    Subjective:   No acute events noted overnight.    Objective:   BP 146/65  Pulse 82  Temp 98.3 F (36.8 C) (Oral)  Resp 19  Ht 5\' 10"  (1.778 m)  Wt 115.3 kg (254 lb 3.1 oz)  BMI 36.47 kg/m2  SpO2 96%  Intake/Output Summary (Last 24 hours) at 03/25/12 0802 Last data filed at 03/25/12 0600  Gross per 24 hour  Intake   1364 ml  Output   1830 ml  Net   -466 ml   Weight change: -1.1 kg (-2 lb 6.8 oz)  Physical Exam: WUJ:WJXBJYNWGNF resting in bed- b/fast tray at bedside AOZ:HYQMV RRR, normal S1 and S2  Resp:Coarse BS bilaterally, no rales/rhonchi HQI:ONGE, obese, NT, BS normal Ext:No LE edema  Imaging: Dg Chest Port 1 View  03/25/2012  *RADIOLOGY REPORT*  Clinical Data: Status post CABG.  PORTABLE CHEST - 1 VIEW  Comparison: 03/23/2012  Findings: Aeration of the left lung has improved.  Residual atelectasis present.  No edema.  No pneumothorax identified. Stable heart size.  Stable positioning of the central line.  IMPRESSION: Improved aeration of the left lung.   Original Report Authenticated By: Reola Calkins, M.D.     Labs: BMET  Lab 03/25/12 0400 03/24/12 0345 03/23/12 0437 03/22/12 0425 03/21/12 0400 03/20/12 1600 03/20/12 0459 03/19/12 0357  NA 138 140 140 139 141 136 137 --  K 3.8 4.0 3.7 4.0 3.8 3.9 4.0 --  CL 102 104 103 102 103 97 99 --  CO2 28 27 29 27 27 26 25  --  GLUCOSE 141* 145* 116* 183* 158* 252* 122* --  BUN 48* 51* 60* 72* 95* 105* 112* --  CREATININE 1.84* 1.80* 1.93* 2.02* 2.46* 2.67* 3.01* --  ALB -- -- -- -- -- -- -- --  CALCIUM 9.2 9.2 9.4 9.4 9.4 8.8 9.1 --  PHOS 3.6 3.6 -- -- -- 4.3 -- 6.9*   CBC  Lab 03/25/12 0400 03/24/12 0345 03/23/12 0437 03/22/12 0425  WBC 11.1* 12.5* 12.8* 12.2*  NEUTROABS -- -- -- --  HGB 9.1* 9.5* 9.6* 10.3*  HCT 28.9* 30.3* 30.5* 31.4*  MCV 93.5 92.9 91.9 89.5  PLT 143* 156 176 199      Medications:      . amiodarone  200 mg Oral Daily  . aspirin EC  325 mg Oral Daily   Or  . aspirin  324 mg Per Tube Daily  . bisacodyl  10 mg Oral Daily   Or  . bisacodyl  10 mg Rectal Daily  . brimonidine  1 drop Left Eye BID  . ceFEPime (MAXIPIME) IV  1 g Intravenous Q24H  . citalopram  20 mg Oral Daily  . darbepoetin (ARANESP) injection - NON-DIALYSIS  200 mcg Subcutaneous Q Sun-1800  . docusate sodium  200 mg Oral Daily  . dorzolamide-timolol  1 drop Left Eye BID  . erythromycin  1 application Right Eye BID  . ezetimibe  10 mg Oral Daily  . feeding supplement  237 mL Oral BID BM  . insulin aspart  0-24 Units Subcutaneous Q4H  . insulin aspart  4 Units Subcutaneous TID WC  . insulin glargine  14 Units Subcutaneous BID  . ipratropium  1 spray Nasal BID  . loteprednol  1 drop Right Eye BID  . metoprolol tartrate  12.5 mg Oral BID  Or  . metoprolol tartrate  12.5 mg Per Tube BID  . pantoprazole  40 mg Oral Q1200  . polyvinyl alcohol  1 drop Both Eyes BID  . Tamsulosin HCl  0.4 mg Oral QPC supper     Assessment/ Plan:   1. Acute Kidney Injury on CKD - diabetic nephropathy (baseline creatinine of 1.7). His AKI is thought to be ischemic ATN with retroperitoneal bleed/hemodynamic fluctuations with repeat injury from UTI/SIRS. Renal function continues to improve with a satisfactory UOP. (suspect BUN will be slower to fall with hematoma resorption)- continue to leave him off diuretics unless UOP falls <1.5L/24hr. Mental status improving. Started on tamsulosin QHS yesterday to facilitate ability to DC foley in 3-4 days.  2. Pseudomonas UTI - positive culture 9/24, pan sensitive, on cefepime. Not bacteremic-Blood cx negative.  3. Normocytic Anemia - Likely secondary to acute blood loss from retroperitoneal bleed and blood loss from surgery. Currently on intravenous iron/subcutaneous ESA therapy.  4. Coronary artery disease: s/p 4 vessel CABG on 9/16. On metoprolol 12.5 mg BID.  Remains heart rate controlled and with acceptable blood pressures.   Given our limited input at this point, will sign off for now- Please call if further questions/Concerns arise   Zetta Bills, MD 03/25/2012, 8:02 AM

## 2012-03-25 NOTE — Progress Notes (Signed)
Patient ID: RHYS LICHTY, male   DOB: 14-Feb-1941, 71 y.o.   MRN: [id number] TCTS DAILY PROGRESS NOTE                                     [address]            [place] [address]          [phone]        20 Days Post-Op Procedure(s) (LRB): CORONARY ARTERY BYPASS GRAFTING (CABG) (N/A)  Total Length of Stay:  LOS: 27 days   Subjective: Alert,  feeding self tolerated going around unit in wheel chair  Objective: Vital signs in last 24 hours: Temp:  [98.3 F (36.8 C)-98.7 F (37.1 C)] 98.3 F (36.8 C) (10/06 0715) Pulse Rate:  [70-88] 85  (10/06 0900) Cardiac Rhythm:  [-] Normal sinus rhythm (10/06 0900) Resp:  [14-23] 20  (10/06 0900) BP: (94-148)/(52-89) 148/89 mmHg (10/06 0900) SpO2:  [89 %-98 %] 90 % (10/06 0900) Weight:  [254 lb 3.1 oz (115.3 kg)] 254 lb 3.1 oz (115.3 kg) (10/06 0500)  Filed Weights   03/23/12 0500 03/24/12 0500 03/25/12 0500  Weight: 256 lb 9.9 oz (116.4 kg) 256 lb 9.9 oz (116.4 kg) 254 lb 3.1 oz (115.3 kg)    Weight change: -2 lb 6.8 oz (-1.1 kg)   Hemodynamic parameters for last 24 hours:    Intake/Output from previous day: 10/05 0701 - 10/06 0700 In: 1564 [P.O.:1514; IV Piggyback:50] Out: 1930 [Urine:1930]  Intake/Output this shift: Total I/O In: 400 [P.O.:400] Out: 250 [Urine:250]  Current Meds: Scheduled Meds:    . amiodarone  200 mg Oral Daily  . aspirin EC  325 mg Oral Daily   Or  . aspirin  324 mg Per Tube Daily  . bisacodyl  10 mg Oral Daily   Or  . bisacodyl  10 mg Rectal Daily  . brimonidine  1 drop Left Eye BID  . ceFEPime (MAXIPIME) IV  1 g Intravenous Q24H  . citalopram  20 mg Oral Daily  . darbepoetin (ARANESP) injection - NON-DIALYSIS  200 mcg Subcutaneous Q Sun-1800  . docusate sodium  200 mg Oral Daily  . dorzolamide-timolol  1 drop Left Eye BID  . erythromycin  1 application Right Eye BID  . ezetimibe  10 mg Oral Daily  . feeding supplement  237 mL Oral BID BM  . insulin aspart  0-24  Units Subcutaneous Q4H  . insulin aspart  4 Units Subcutaneous TID WC  . insulin glargine  14 Units Subcutaneous BID  . ipratropium  1 spray Nasal BID  . loteprednol  1 drop Right Eye BID  . metoprolol tartrate  12.5 mg Oral BID   Or  . metoprolol tartrate  12.5 mg Per Tube BID  . pantoprazole  40 mg Oral Q1200  . polyvinyl alcohol  1 drop Both Eyes BID  . Tamsulosin HCl  0.4 mg Oral QPC supper   Continuous Infusions:    . sodium chloride Stopped (03/22/12 1200)   PRN Meds:.albuterol, lactulose, levalbuterol, metoprolol, ondansetron (ZOFRAN) IV, oxyCODONE, sodium chloride  General appearance: alert, cooperative, appears older than stated age and no distress Neurologic: intact Heart: regular rate and rhythm, S1, S2 normal, no murmur, click, rub or gallop Lungs: diminished breath sounds bibasilar Abdomen: soft, non-tender; bowel sounds normal; no masses,  no organomegaly Extremities: extremities normal, atraumatic, no cyanosis or edema,  Homans sign is negative, no sign of DVT and wounds do not appear infected   Lab Results: CBC:  Basename 03/25/12 0400 03/24/12 0345  WBC 11.1* 12.5*  HGB 9.1* 9.5*  HCT 28.9* 30.3*  PLT 143* 156   BMET:   Basename 03/25/12 0400 03/24/12 0345  NA 138 140  K 3.8 4.0  CL 102 104  CO2 28 27  GLUCOSE 141* 145*  BUN 48* 51*  CREATININE 1.84* 1.80*  CALCIUM 9.2 9.2    PT/INR: No results found for this basename: LABPROT,INR in the last 72 hours Radiology: Dg Chest Port 1 View  03/25/2012  *RADIOLOGY REPORT*  Clinical Data: Status post CABG.  PORTABLE CHEST - 1 VIEW  Comparison: 03/23/2012  Findings: Aeration of the left lung has improved.  Residual atelectasis present.  No edema.  No pneumothorax identified. Stable heart size.  Stable positioning of the central line.  IMPRESSION: Improved aeration of the left lung.   Original Report Authenticated By: Reola Calkins, M.D.      Assessment/Plan: S/P Procedure(s) (LRB): CORONARY ARTERY  BYPASS GRAFTING (CABG) (N/A) Acute urinary retention with foley replaced two days ago  for greater then 600 ml residual, no on flow max will have urology see Monday To 3300 Monday then start working on SNF plans with rehab Needs long term care plan, stabilizing from acute issues     Arturo Freundlich B 03/25/2012 9:16 AM

## 2012-03-26 LAB — BASIC METABOLIC PANEL
BUN: 44 mg/dL — ABNORMAL HIGH (ref 6–23)
CO2: 27 mEq/L (ref 19–32)
Calcium: 9.1 mg/dL (ref 8.4–10.5)
Chloride: 103 mEq/L (ref 96–112)
Creatinine, Ser: 1.78 mg/dL — ABNORMAL HIGH (ref 0.50–1.35)
GFR calc Af Amer: 42 mL/min — ABNORMAL LOW (ref >90)
GFR calc non Af Amer: 36 mL/min — ABNORMAL LOW (ref >90)
Glucose, Bld: 131 mg/dL — ABNORMAL HIGH (ref 70–99)
Potassium: 4 mEq/L (ref 3.5–5.1)
Sodium: 136 mEq/L (ref 135–145)

## 2012-03-26 LAB — CBC
HCT: 30 % — ABNORMAL LOW (ref 39.0–52.0)
Hemoglobin: 9.4 g/dL — ABNORMAL LOW (ref 13.0–17.0)
MCH: 29.4 pg (ref 26.0–34.0)
MCHC: 31.3 g/dL (ref 30.0–36.0)
MCV: 93.8 fL (ref 78.0–100.0)
Platelets: 142 10*3/uL — ABNORMAL LOW (ref 150–400)
RBC: 3.2 MIL/uL — ABNORMAL LOW (ref 4.22–5.81)
RDW: 17.5 % — ABNORMAL HIGH (ref 11.5–15.5)
WBC: 10.5 10*3/uL (ref 4.0–10.5)

## 2012-03-26 LAB — URINE CULTURE
Colony Count: NO GROWTH
Culture: NO GROWTH

## 2012-03-26 LAB — GLUCOSE, CAPILLARY
Glucose-Capillary: 118 mg/dL — ABNORMAL HIGH (ref 70–99)
Glucose-Capillary: 135 mg/dL — ABNORMAL HIGH (ref 70–99)
Glucose-Capillary: 136 mg/dL — ABNORMAL HIGH (ref 70–99)
Glucose-Capillary: 206 mg/dL — ABNORMAL HIGH (ref 70–99)
Glucose-Capillary: 272 mg/dL — ABNORMAL HIGH (ref 70–99)
Glucose-Capillary: 302 mg/dL — ABNORMAL HIGH (ref 70–99)

## 2012-03-26 MED ORDER — SODIUM CHLORIDE 0.9 % IV SOLN: 250.0000 mL | INTRAVENOUS | Status: DC | PRN

## 2012-03-26 MED ORDER — VITAMIN D3 25 MCG (1000 UNIT) PO TABS
1000.0000 [IU] | ORAL_TABLET | Freq: Every day | ORAL | Status: DC
  Administered 2012-03-26 – 2012-03-27 (×2): 1000 [IU] via ORAL
  Filled 2012-03-26 (×2): qty 1

## 2012-03-26 MED ORDER — OXYCODONE HCL 5 MG PO TABS
5.0000 mg | ORAL_TABLET | ORAL | Status: DC | PRN
  Administered 2012-03-26 – 2012-03-27 (×4): 10 mg via ORAL
  Filled 2012-03-26 (×4): qty 2

## 2012-03-26 MED ORDER — ATORVASTATIN CALCIUM 40 MG PO TABS
40.0000 mg | ORAL_TABLET | Freq: Every day | ORAL | Status: DC
  Administered 2012-03-26: 40 mg via ORAL
  Filled 2012-03-26 (×2): qty 1

## 2012-03-26 MED ORDER — INSULIN ASPART 100 UNIT/ML ~~LOC~~ SOLN
0.0000 [IU] | Freq: Three times a day (TID) | SUBCUTANEOUS | Status: DC
  Administered 2012-03-26: 16 [IU] via SUBCUTANEOUS
  Administered 2012-03-26: 12 [IU] via SUBCUTANEOUS
  Administered 2012-03-26: 8 [IU] via SUBCUTANEOUS
  Administered 2012-03-26: 2 [IU] via SUBCUTANEOUS
  Administered 2012-03-27 (×2): 8 [IU] via SUBCUTANEOUS

## 2012-03-26 MED ORDER — SODIUM CHLORIDE 0.9 % IJ SOLN
3.0000 mL | Freq: Two times a day (BID) | INTRAMUSCULAR | Status: DC
  Administered 2012-03-26: 30 mL via INTRAVENOUS
  Administered 2012-03-27: 3 mL via INTRAVENOUS

## 2012-03-26 MED ORDER — SODIUM CHLORIDE 0.9 % IJ SOLN: 3.0000 mL | INTRAMUSCULAR | Status: DC | PRN

## 2012-03-26 MED ORDER — MOVING RIGHT ALONG BOOK
Freq: Once | Status: AC
  Administered 2012-03-26: 08:00:00
  Filled 2012-03-26: qty 1

## 2012-03-26 NOTE — Progress Notes (Signed)
Epicardial pacing wires removed per order at 1215, Vital signs monitored per protocol.  No signs of bleeding, Pt tolerated well.  Pt in bed during removal and on bedrest for 2 hours after.  Will continue to monitor

## 2012-03-26 NOTE — Progress Notes (Signed)
Physical Therapy Treatment Patient Details Name: [name] MRN: [id number] DOB: 03/15/1941 Today's Date: 03/26/2012 Time: 7829-5621 PT Time Calculation (min): 26 min  PT Assessment / Plan / Recommendation Comments on Treatment Session  Pt s/p CABG.  Pt making slow but steady progress.    Follow Up Recommendations  Post acute inpatient     Does the patient have the potential to tolerate intense rehabilitation  No, Recommend SNF  Barriers to Discharge        Equipment Recommendations  None recommended by PT    Recommendations for Other Services    Frequency Min 2X/week   Plan Discharge plan remains appropriate;Frequency remains appropriate    Precautions / Restrictions Precautions Precautions: Sternal;Fall   Pertinent Vitals/Pain VSS    Mobility  Bed Mobility Supine to Sit: HOB elevated;4: Min assist Sitting - Scoot to Edge of Bed: 4: Min assist Details for Bed Mobility Assistance: assist to bring trunk up. Transfers Sit to Stand: From bed;From chair/3-in-1;With upper extremity assist;Other (comment);1: +2 Total assist (with [place] Dec Plus) Stand to Sit: 1: +2 Total assist;To chair/3-in-1 (with [place] Dec Plus) Stand Pivot Transfers: 1: +2 Total assist (with [place] Dec plus) Transfer via Lift Equipment: Hydrographic surveyor Details for Transfer Assistance: Stood x 2 with [employer].  Support at lt knee to keep foot and knee in position.    Exercises     PT Diagnosis:    PT Problem List:   PT Treatment Interventions:     PT Goals Acute Rehab PT Goals Pt will go Supine/Side to Sit: with supervision PT Goal: Supine/Side to Sit - Progress: Updated due to goal met PT Goal: Sit at Edge Of Bed - Progress: Met PT Goal: Sit to Stand - Progress: Progressing toward goal PT Goal: Stand to Sit - Progress: Progressing toward goal PT Transfer Goal: Bed to Chair/Chair to Bed - Progress: Progressing toward goal PT Goal: Stand - Progress: Progressing toward goal  Visit Information  Last PT Received  On: 03/26/12 Assistance Needed: +2    Subjective Data  Subjective: "Where's the bathroom?"   Cognition  Overall Cognitive Status: Difficult to assess Difficult to assess due to: Hard of hearing/deaf Arousal/Alertness: Awake/alert Behavior During Session: [facility] for tasks performed Cognition - Other Comments: Pt engaged in treatment session.    Balance  Static Sitting Balance Static Sitting - Balance Support: Bilateral upper extremity supported Static Sitting - Level of Assistance: 5: Stand by assistance Static Standing Balance Static Standing - Balance Support: Bilateral upper extremity supported Static Standing - Level of Assistance: 4: Min assist;Other (comment) (and Sara plus) Static Standing - Comment/# of Minutes: Stood x 1-2 minutes in [place] Plus with assist to control LLE.  End of Session PT - End of Session Equipment Utilized During Treatment: Other (comment) (sara plus) Activity Tolerance: Patient tolerated treatment well Patient left: in chair;with call bell/phone within reach Nurse Communication: Mobility status;Need for lift equipment   GP     Tedi Hughson 03/26/2012, 2:51 PM  [facility] PT [phone]

## 2012-03-26 NOTE — Progress Notes (Signed)
Subjective:  Laying in bed with eyes shut, no complaints.   Objective:  Vital Signs in the last 24 hours: Temp:  [98.3 F (36.8 C)-98.7 F (37.1 C)] 98.3 F (36.8 C) (10/07 0743) Pulse Rate:  [66-81] 71  (10/07 0800) Resp:  [14-22] 17  (10/07 0800) BP: (90-153)/(53-93) 134/93 mmHg (10/07 0800) SpO2:  [91 %-98 %] 96 % (10/07 0800) Weight:  [115.3 kg (254 lb 3.1 oz)] 115.3 kg (254 lb 3.1 oz) (10/07 0600)  Intake/Output from previous day: 10/06 0701 - 10/07 0700 In: 1050 [P.O.:1050] Out: 1895 [Urine:1895]   Physical Exam: General: Well developed, well nourished, in no acute distress. Head:  Normocephalic and atraumatic. Lungs: Clear to auscultation and percussion. Heart: Normal S1 and S2.  No murmur, rubs or gallops. Chest scar well healed, improving Abdomen: soft, non-tender, positive bowel sounds. Extremities: No clubbing or cyanosis. No edema. Neurologic: Non focal    Lab Results:  Basename 03/26/12 0400 03/25/12 0400  WBC 10.5 11.1*  HGB 9.4* 9.1*  PLT 142* 143*    Basename 03/26/12 0400 03/25/12 0400  NA 136 138  K 4.0 3.8  CL 103 102  CO2 27 28  GLUCOSE 131* 141*  BUN 44* 48*  CREATININE 1.78* 1.84*    Telemetry: NSR, no longer AFIB (artifact not NSVT on tele) Personally viewed.   Assessment/Plan:  Active Problems:  Diabetes mellitus  Coronary arteriosclerosis  Chronic renal insufficiency, stage III (moderate)  COPD (chronic obstructive pulmonary disease)  Blindness - both eyes  Deafness  CAD - stable post bypass. Awaiting placement. We were asked to review discharge medications and to set up follow up. ASA, metoprolol.  AFIB - post op - I would continue with amiodarone 200mg  once a day for now. Hopeful discontinuation in future if no longer demonstrates afib.   Hyperlipidemia - Zetia and atorvastatin 40mg . Continue, goal LDL <70.   DM - insulin reg. Per Dr. Donette Patrick  CKD - Dr. Allena Patrick on board. Urinary retention. DM nephrop.   Will relay to Dr.  Mayford Patrick, primary cardiology.  Discussed case with James Bible. OK to set up outpatient follow up appt.         [name] 03/26/2012, 9:20 AM

## 2012-03-26 NOTE — Progress Notes (Signed)
Inpatient Diabetes Program Recommendations  AACE/ADA: New Consensus Statement on Inpatient Glycemic Control (2013)  Target Ranges:  Prepandial:   less than 140 mg/dL      Peak postprandial:   less than 180 mg/dL (1-2 hours)      Critically ill patients:  140 - 180 mg/dL   Reason for Visit: Hyperglycemia  Inpatient Diabetes Program Recommendations Insulin - Basal: Lantus dose is 14 units bid Correction (SSI): Correction changed to CVTS scale ac and hs Insulin - Meal Coverage: Meal coverage of 4 units tid with meals stopped.  Please restart meal coverage.  Note:  Results for JONH, MCQUEARY (MRN [id number]) as of 03/26/2012 15:20  Ref. Range 03/25/2012 07:13 03/25/2012 11:35 03/25/2012 16:22 03/25/2012 19:52 03/25/2012 23:52 03/26/2012 03:45 03/26/2012 07:44 03/26/2012 12:00  Glucose-Capillary Latest Range: 70-99 mg/dL 409 (H) 811 (H) 914 (H) 135 (H) 118 (H) 136 (H) 135 (H) 302 (H)   Thank you.  Shamel Germond S. Elsie Lincoln, RN, CNS, CDE  [phone])

## 2012-03-26 NOTE — Progress Notes (Signed)
03/26/2012 4:48 PM Pt arrived from 2300. Vss. Oriented to unit. Call bell within reach. [name]

## 2012-03-26 NOTE — Progress Notes (Signed)
CSW talked with [name] at [facility] [phone]) who has met with pt's wife today.  [facility] is prepared to accept pt tomorrow 10/8.  [facility] needs discharge summary with meds faxed preferably before 3pm.

## 2012-03-26 NOTE — Progress Notes (Signed)
Clinical Social Work- CSW submitted updated pt and progress notes for prior auth-CSW spoke with [employer] representative who will provide both ambulance and SNF auth tomorrow- This CSW will notify CSW now following- [employer]

## 2012-03-26 NOTE — Progress Notes (Signed)
Patient ID: [name], male   DOB: 1940/06/30, 71 y.o.   MRN: [id number] TCTS DAILY PROGRESS NOTE                   [address]            [place] [address]          [phone]      21 Days Post-Op Procedure(s) (LRB): CORONARY ARTERY BYPASS GRAFTING (CABG) (N/A)  Total Length of Stay:  LOS: 28 days   Subjective: Alert, and talkative today  Objective: Vital signs in last 24 hours: Temp:  [98.3 F (36.8 C)-98.7 F (37.1 C)] 98.7 F (37.1 C) (10/07 0400) Pulse Rate:  [66-85] 70  (10/07 0600) Cardiac Rhythm:  [-] Normal sinus rhythm (10/07 0600) Resp:  [14-22] 18  (10/07 0600) BP: (90-153)/(53-89) 137/61 mmHg (10/07 0600) SpO2:  [90 %-98 %] 97 % (10/07 0600) Weight:  [254 lb 3.1 oz (115.3 kg)] 254 lb 3.1 oz (115.3 kg) (10/07 0600)  Filed Weights   03/24/12 0500 03/25/12 0500 03/26/12 0600  Weight: 256 lb 9.9 oz (116.4 kg) 254 lb 3.1 oz (115.3 kg) 254 lb 3.1 oz (115.3 kg)    Weight change: 0 lb (0 kg)   Hemodynamic parameters for last 24 hours:    Intake/Output from previous day: 10/06 0701 - 10/07 0700 In: 1050 [P.O.:1050] Out: 1895 [Urine:1895]  Intake/Output this shift:    Current Meds: Scheduled Meds:   . amiodarone  200 mg Oral Daily  . aspirin EC  325 mg Oral Daily   Or  . aspirin  324 mg Per Tube Daily  . bisacodyl  10 mg Oral Daily   Or  . bisacodyl  10 mg Rectal Daily  . brimonidine  1 drop Left Eye BID  . citalopram  20 mg Oral Daily  . darbepoetin (ARANESP) injection - NON-DIALYSIS  200 mcg Subcutaneous Q Sun-1800  . docusate sodium  200 mg Oral Daily  . dorzolamide-timolol  1 drop Left Eye BID  . erythromycin  1 application Right Eye BID  . ezetimibe  10 mg Oral Daily  . feeding supplement  237 mL Oral BID BM  . insulin aspart  0-24 Units Subcutaneous Q4H  . insulin aspart  4 Units Subcutaneous TID WC  . insulin glargine  14 Units Subcutaneous BID  . ipratropium  1 spray Nasal BID  . loteprednol  1 drop Right Eye  BID  . metoprolol tartrate  12.5 mg Oral BID   Or  . metoprolol tartrate  12.5 mg Per Tube BID  . pantoprazole  40 mg Oral Q1200  . polyvinyl alcohol  1 drop Both Eyes BID  . Tamsulosin HCl  0.4 mg Oral QPC supper  . DISCONTD: ceFEPime (MAXIPIME) IV  1 g Intravenous Q24H   Continuous Infusions:   . sodium chloride Stopped (03/22/12 1200)   PRN Meds:.albuterol, lactulose, levalbuterol, metoprolol, ondansetron (ZOFRAN) IV, oxyCODONE, sodium chloride  General appearance: alert, cooperative and no distress Neurologic: intact Heart: regular rate and rhythm, S1, S2 normal, no murmur, click, rub or gallop Lungs: clear to auscultation bilaterally Abdomen: soft, non-tender; bowel sounds normal; no masses,  no organomegaly Extremities: wounds improving Wound: sternum stable foley still in  Lab Results: CBC: Basename 03/26/12 0400 03/25/12 0400  WBC 10.5 11.1*  HGB 9.4* 9.1*  HCT 30.0* 28.9*  PLT 142* 143*   BMET:  Basename 03/26/12 0400 03/25/12 0400  NA 136 138  K 4.0 3.8  CL 103 102  CO2 27 28  GLUCOSE 131* 141*  BUN 44* 48*  CREATININE 1.78* 1.84*  CALCIUM 9.1 9.2    PT/INR: No results found for this basename: LABPROT,INR in the last 72 hours Radiology: Dg Chest Port 1 View  03/25/2012  *RADIOLOGY REPORT*  Clinical Data: Status post CABG.  PORTABLE CHEST - 1 VIEW  Comparison: 03/23/2012  Findings: Aeration of the left lung has improved.  Residual atelectasis present.  No edema.  No pneumothorax identified. Stable heart size.  Stable positioning of the central line.  IMPRESSION: Improved aeration of the left lung.   Original Report Authenticated By: Reola Calkins, M.D.      Assessment/Plan: S/P Procedure(s) (LRB): CORONARY ARTERY BYPASS GRAFTING (CABG) (N/A) Urinary retention 3 episodes with foley removal with pseudomanias UTI , will as urology to see to arrange out patient care of urinary problems Cardiology has not seen since surgery will re consult to coordinate   home/snf cardiology care Case manger to arrange SNF To 3300    Caeli Linehan B 03/26/2012 7:34 AM

## 2012-03-26 NOTE — Progress Notes (Signed)
Clinical Social Work CSW talked with pt's wife [phone]) about the bed offers from [place] and [facility].  W[facility] chose [facility] [phone]).  CSW educated wife about tranfer process. Salomon Fick, LCSW [phone]

## 2012-03-26 NOTE — Progress Notes (Signed)
03/26/2012 6:49 PM Pt's wife states she wants her husband to go to [facility] and not [facility]. Will pass information on to Child psychotherapist. Celesta Gentile

## 2012-03-27 LAB — BASIC METABOLIC PANEL
BUN: 40 mg/dL — ABNORMAL HIGH (ref 6–23)
CO2: 27 mEq/L (ref 19–32)
Calcium: 9.1 mg/dL (ref 8.4–10.5)
Chloride: 103 mEq/L (ref 96–112)
Creatinine, Ser: 1.67 mg/dL — ABNORMAL HIGH (ref 0.50–1.35)
GFR calc Af Amer: 45 mL/min — ABNORMAL LOW (ref >90)
GFR calc non Af Amer: 39 mL/min — ABNORMAL LOW (ref >90)
Glucose, Bld: 167 mg/dL — ABNORMAL HIGH (ref 70–99)
Potassium: 4.3 mEq/L (ref 3.5–5.1)
Sodium: 138 mEq/L (ref 135–145)

## 2012-03-27 LAB — CBC
HCT: 30.7 % — ABNORMAL LOW (ref 39.0–52.0)
Hemoglobin: 9.6 g/dL — ABNORMAL LOW (ref 13.0–17.0)
MCH: 29.4 pg (ref 26.0–34.0)
MCHC: 31.3 g/dL (ref 30.0–36.0)
MCV: 93.9 fL (ref 78.0–100.0)
Platelets: 135 10*3/uL — ABNORMAL LOW (ref 150–400)
RBC: 3.27 MIL/uL — ABNORMAL LOW (ref 4.22–5.81)
RDW: 17.2 % — ABNORMAL HIGH (ref 11.5–15.5)
WBC: 9.6 10*3/uL (ref 4.0–10.5)

## 2012-03-27 LAB — GLUCOSE, CAPILLARY
Glucose-Capillary: 206 mg/dL — ABNORMAL HIGH (ref 70–99)
Glucose-Capillary: 236 mg/dL — ABNORMAL HIGH (ref 70–99)

## 2012-03-27 MED ORDER — ATORVASTATIN CALCIUM 40 MG PO TABS: 40.0000 mg | ORAL_TABLET | Freq: Every day | ORAL | Status: DC

## 2012-03-27 MED ORDER — INSULIN GLARGINE 100 UNIT/ML ~~LOC~~ SOLN: 20.0000 [IU] | Freq: Two times a day (BID) | SUBCUTANEOUS | Status: DC

## 2012-03-27 MED ORDER — INSULIN GLARGINE 100 UNIT/ML ~~LOC~~ SOLN
20.0000 [IU] | Freq: Two times a day (BID) | SUBCUTANEOUS | Status: DC
  Administered 2012-03-27: 20 [IU] via SUBCUTANEOUS

## 2012-03-27 MED ORDER — INSULIN ASPART 100 UNIT/ML ~~LOC~~ SOLN: 4.0000 [IU] | Freq: Three times a day (TID) | SUBCUTANEOUS | Status: DC

## 2012-03-27 MED ORDER — INSULIN ASPART 100 UNIT/ML ~~LOC~~ SOLN
4.0000 [IU] | Freq: Three times a day (TID) | SUBCUTANEOUS | Status: DC
  Administered 2012-03-27: 4 [IU] via SUBCUTANEOUS

## 2012-03-27 MED ORDER — CITALOPRAM HYDROBROMIDE 20 MG PO TABS: 20.0000 mg | ORAL_TABLET | Freq: Every day | ORAL | Status: DC

## 2012-03-27 MED ORDER — AMIODARONE HCL 200 MG PO TABS: 200.0000 mg | ORAL_TABLET | Freq: Every day | ORAL | Status: DC

## 2012-03-27 MED ORDER — SODIUM CHLORIDE 0.9 % IJ SOLN
INTRAMUSCULAR | Status: AC
  Filled 2012-03-27: qty 20

## 2012-03-27 MED ORDER — GLUCERNA SHAKE PO LIQD: 237.0000 mL | Freq: Two times a day (BID) | ORAL | Status: DC

## 2012-03-27 MED ORDER — TAMSULOSIN HCL 0.4 MG PO CAPS: 0.4000 mg | ORAL_CAPSULE | Freq: Every day | ORAL | Status: AC

## 2012-03-27 MED ORDER — OXYCODONE HCL 5 MG PO TABS: 5.0000 mg | ORAL_TABLET | ORAL | Status: DC | PRN

## 2012-03-27 MED ORDER — METOPROLOL TARTRATE 12.5 MG HALF TABLET: 12.5000 mg | ORAL_TABLET | Freq: Two times a day (BID) | ORAL | Status: DC

## 2012-03-27 NOTE — Progress Notes (Addendum)
[address]            [place] [address]          [phone]     22 Days Post-Op Procedure(s) (LRB): CORONARY ARTERY BYPASS GRAFTING (CABG) (N/A)  Subjective: OOB in chair. No complaints.    Objective: Vital signs in last 24 hours: Patient Vitals for the past 24 hrs:  BP Temp Temp src Pulse Resp SpO2  03/27/12 0739 - 98.2 F (36.8 C) Oral - - -  03/27/12 0411 135/70 mmHg 98.4 F (36.9 C) Oral 71  13  96 %  03/27/12 0400 - - - 70  13  96 %  03/27/12 0015 137/74 mmHg 98.5 F (36.9 C) Oral 70  15  95 %  03/27/12 0000 - - - 71  13  95 %  03/26/12 2106 144/65 mmHg - - 75  - -  03/26/12 1948 141/68 mmHg 98.2 F (36.8 C) Oral 74  19  93 %  03/26/12 1655 147/70 mmHg 98.4 F (36.9 C) Oral 71  16  95 %  03/26/12 1600 133/51 mmHg - - 73  18  94 %  03/26/12 1547 - 98.1 F (36.7 C) Oral - - -  03/26/12 1500 124/59 mmHg - - 71  14  93 %  03/26/12 1400 117/56 mmHg - - 73  18  94 %  03/26/12 1315 166/77 mmHg - - 78  17  97 %  03/26/12 1300 142/65 mmHg - - 69  18  95 %  03/26/12 1245 132/64 mmHg - - 67  12  95 %  03/26/12 1230 123/63 mmHg - - 67  13  93 %  03/26/12 1215 126/54 mmHg - - 69  14  95 %  03/26/12 1200 113/57 mmHg - - 71  14  94 %  03/26/12 1158 - 98.3 F (36.8 C) Oral - - -  03/26/12 1100 124/56 mmHg - - 76  15  92 %  03/26/12 1000 128/55 mmHg - - 79  19  92 %  03/26/12 0900 161/74 mmHg - - 82  26  96 %   Current Weight  03/26/12 254 lb 3.1 oz (115.3 kg)     Intake/Output from previous day: 10/07 0701 - 10/08 0700 In: 820 [P.O.:790; I.V.:30] Out: 2285 [Urine:2285]  CBGs [phone]  PHYSICAL EXAM:  Heart: RRR Lungs:Clear Wound: Clean and dry Extremities: Mild LE edema    Lab Results: CBC: Basename 03/27/12 0400 03/26/12 0400  WBC 9.6 10.5  HGB 9.6* 9.4*  HCT 30.7* 30.0*  PLT 135* 142*   BMET:  Basename 03/27/12 0400 03/26/12 0400  NA 138 136  K 4.3 4.0  CL 103 103  CO2 27 27  GLUCOSE 167* 131*    BUN 40* 44*  CREATININE 1.67* 1.78*  CALCIUM 9.1 9.1    PT/INR: No results found for this basename: LABPROT,INR in the last 72 hours    Assessment/Plan: S/P Procedure(s) (LRB): CORONARY ARTERY BYPASS GRAFTING (CABG) (N/A) CV- SR, BPs generally stable.  Continue current meds.  Appreciate cardiology's input on d/c meds. GU- recurrent urinary retention, currently with Foley in place.  Dr. Tyrone Sage spoke with urologist,and they will see him back as an outpatient to remove Foley. Acute/chronic KD- Cr stable, down slightly.  Monitor. Pseudomonas UTI- Off abx.  Monitor. DM- sugars elevated.  Will increase Lantus, continue meal coverage. Disp-  SNF whenever bed available.   LOS: 29 days    COLLINS,GINA H 03/27/2012  SNF soon I have seen and examined [name] and agree with the above assessment  and plan.  [name] MD Beeper [phone] Office [phone] 03/27/2012 9:31 AM

## 2012-03-27 NOTE — Progress Notes (Signed)
Clinical Child psychotherapist (CSW) prepared and placed pt dc packet (2packets) in pt shadow chart. CSW has submitted pt dc summary and AVS to facility via [facility]. [facility] has been contacted for a 1400 transport to [place]. No additional CSW needs addressed. CSW signing off.  Theresia Bough, MSW, Theresia Majors [phone]

## 2012-03-27 NOTE — Discharge Summary (Signed)
[address]            [place] [address]          [phone]         Discharge Summary  Name: [name] DOB: 10/02/1940 71 y.o. MRN: [id number]   Admission Date: 02/27/2012 Discharge Date: 03/27/2012    Admitting Diagnosis:  Shortness of breath   Discharge Diagnosis:   Left main/severe 3 vessel coronary artery disease  Expected postoperative blood loss anemia  Acute on chronic kidney disease  Left sided retroperitoneal hematoma  Postoperative atrial fibrillation  Pseudomonas urinary tract infection  Urinary retention  Past Medical History  Diagnosis Date  . Myocardial infarction   . Shortness of breath   . Chronic kidney disease     stage 3  . Hypertension   . Depression   . COPD (chronic obstructive pulmonary disease)   . Cardiomyopathy   . GERD (gastroesophageal reflux disease)   . Headache   . Arthritis   . Lipoma   . Hearing loss   . Gastroparesis   . Hemorrhoids   . Colon polyps   . Diabetes mellitus     insulin dependent with neuropathy, nephropathy, retinopathy  . Blindness of right eye     due to diabetic retinopathy  . Coronary artery disease     NSTEMI 2006 with PCI DES left circ 11/18/2004 with residual 50-60% diagonal, 60% LAD, 40% distal RCA  . CHF (congestive heart failure)     mild ischemic DCM EF 40-45% by cath 2006  . Obesity     Procedures:  CORONARY ARTERY BYPASS GRAFTING x 4 (Left internal mammary artery to left anterior descending, saphenous vein graft to diagonal, sequential saphenous vein graft to first obtuse marginal and distal circumflex)   ENDOSCOPIC VEIN HARVEST BILATERAL THIGHS - 03/05/2012   HPI:  The patient is a 71 y.o. male followed by Dr. Mayford Knife with a known history of coronary artery disease. He has been having right eye pain, and was being evaluated for removal of his right eye.  While getting medical clearance from Dr Mayford Knife, he mentioned he was having increasing  shortness of breath and chest pressure with exertion. He reports 2 episodes of shortness of breath right after taking a shower that resolved with rest. His chest pressure has been very fleeting, and has not required nitroglycerin. A nuclear stress test was performed, which was abnormal.  He was subsequently admitted on the above date for IV hydration prior to proceeding with cardiac catheterization.     Hospital Course:  The patient was admitted to [facility] on 02/27/2012.  He underwent cardiac catheterization by Dr. Mayford Knife on 02/28/2012, which revealed severe 3 vessel CAD, including an 80% left main stenosis.  A cardiac surgery consult was requested, and the patient was seen by Dr. Tyrone Sage.  He recommended CABG following a brief Plavix washout period.  All risks, benefits and alternatives of surgery were explained in detail, and the patient agreed to proceed. He remained stable and pain free in the hospital prior to surgery.   The patient was taken to the operating room and underwent the above procedure. On arrival to the operating room,  a significant drop in his hemoglobin and hematocrit were noted, which was thought to be due to a previously undiagnosed retroperitoneal bleed.  It was elected to transfuse as needed and proceed with surgery in  light of his severe left main disease.  The surgery was able to be performed uneventfully. Please see op note for full details.  The postoperative course was notable for a prolonged ICU stay.  A CT scan confirmed the suspected left sided retroperitoneal bleed, and this was managed conservatively.  He did require additional transfusions for anemia, and was started on Aranesp.  He developed atrial fibrillation, and was treated with Amiodarone and beta blocker therapy.  He had some worsening of his renal status, and was followed closely by nephrology.  He was treated with diuresis and dopamine, and maintained decent urine output.  His Foley was removed, but had to be  replaced due to urinary retention.  He developed fever and leukocytosis, and was found to have a pseudomonas UTI by culture.  He was started on IV antibiotics.  He did have some worsening of his renal function with peak creatinine of 4.55, but this improved with conservative management. He was noted on labs to have elevated transaminases, and his statin was held until the LFTs trended down. A second voiding trial was attempted, but his Foley required replacement again for urinary retention.    Overall, he is making slow but steady progress.  His renal function continues to improve. He has completed a full course of antibiotics for his UTI.  His Foley remains in place, and urology plans on outpatient follow up for removal.  He remains in sinus rhythm on Lopressor and Amiodarone.  His statin has been resumed at a lower dose, and his LFTs have been stable.  He has been restarted on insulin, and his doses have required adjusting due to hyperglycemia now that his po intake has improved.  He is working with PT and OT on reconditioning, and it is felt he will require short tem skilled nursing facility placement post-discharge.  Social work is assisting the family with bed offers, and he is presently medically stable for transfer.     Recent vital signs:  Filed Vitals:   03/27/12 0739  BP:   Pulse:   Temp: 98.2 F (36.8 C)  Resp:     Recent laboratory studies:  CBC: Basename 03/27/12 0400 03/26/12 0400  WBC 9.6 10.5  HGB 9.6* 9.4*  HCT 30.7* 30.0*  PLT 135* 142*   BMET:  Basename 03/27/12 0400 03/26/12 0400  NA 138 136  K 4.3 4.0  CL 103 103  CO2 27 27  GLUCOSE 167* 131*  BUN 40* 44*  CREATININE 1.67* 1.78*  CALCIUM 9.1 9.1    PT/INR: No results found for this basename: LABPROT,INR in the last 72 hours  LFTS:  Lab Results  Component Value Date   ALT 49 03/22/2012   AST 25 03/22/2012   ALKPHOS 110 03/22/2012   BILITOT 0.8 03/22/2012    Discharge Medications:     Medication List       As of 03/27/2012  8:58 AM    STOP taking these medications         acetaminophen 500 MG tablet   Commonly known as: TYLENOL      clopidogrel 75 MG tablet   Commonly known as: PLAVIX      exenatide 10 MCG/0.04ML Soln   Commonly known as: BYETTA      ferrous sulfate 325 (65 FE) MG tablet      fish oil-omega-3 fatty acids 1000 MG capsule      furosemide 80 MG tablet   Commonly known as: LASIX      insulin  lispro 100 UNIT/ML injection   Commonly known as: HUMALOG      midodrine 5 MG tablet   Commonly known as: PROAMATINE      ramipril 2.5 MG capsule   Commonly known as: ALTACE      risperiDONE 1 MG tablet   Commonly known as: RISPERDAL      SUPER B COMPLEX PO      TAKE these medications         albuterol 108 (90 BASE) MCG/ACT inhaler   Commonly known as: PROVENTIL HFA;VENTOLIN HFA   Inhale 1-2 puffs into the lungs every 6 (six) hours as needed. For shortness of breath      amiodarone 200 MG tablet   Commonly known as: PACERONE   Take 1 tablet (200 mg total) by mouth daily.      aspirin 325 MG tablet   Take 325 mg by mouth daily.      atorvastatin 40 MG tablet   Commonly known as: LIPITOR   Take 1 tablet (40 mg total) by mouth daily.      brimonidine 0.15 % ophthalmic solution   Commonly known as: ALPHAGAN   Place 1 drop into the left eye 2 (two) times daily.      CALCIUM CITRATE + D PO   Take 1 tablet by mouth daily. Calcium Citrate with Vitamin D 630mg /500iu      cholecalciferol 1000 UNITS tablet   Commonly known as: VITAMIN D   Take 1,000 Units by mouth daily.      citalopram 20 MG tablet   Commonly known as: CELEXA   Take 1 tablet (20 mg total) by mouth daily.      dorzolamide-timolol 22.3-6.8 MG/ML ophthalmic solution   Commonly known as: COSOPT   Place 1 drop into the left eye 2 (two) times daily. Administer 5 minutes after Lotemax and Alphagan      erythromycin ophthalmic ointment   Place 1 application into the right eye 2 (two) times  daily. Administer 10 minutes after Systane      ezetimibe 10 MG tablet   Commonly known as: ZETIA   Take 10 mg by mouth daily.      feeding supplement Liqd   Take 237 mLs by mouth 2 (two) times daily between meals.      FIBER FORMULA PO   Take 1,000 mg by mouth 2 (two) times daily.      insulin aspart 100 UNIT/ML injection   Commonly known as: novoLOG   Inject 4 Units into the skin 3 (three) times daily with meals.      insulin glargine 100 UNIT/ML injection   Commonly known as: LANTUS   Inject 20 Units into the skin 2 (two) times daily.      ipratropium 0.03 % nasal spray   Commonly known as: ATROVENT   Place 2 sprays into the nose 2 (two) times daily.      loteprednol 0.5 % ophthalmic suspension   Commonly known as: LOTEMAX   Place 1 drop into the right eye 2 (two) times daily.      metoprolol tartrate 12.5 mg Tabs   Commonly known as: LOPRESSOR   Take 0.5 tablets (12.5 mg total) by mouth 2 (two) times daily.      multivitamin with minerals Tabs   Take 1 tablet by mouth daily.      oxyCODONE 5 MG immediate release tablet   Commonly known as: Oxy IR/ROXICODONE   Take 1-2 tablets (5-10 mg total) by mouth  every 3 (three) hours as needed for pain.      pantoprazole 40 MG tablet   Commonly known as: PROTONIX   Take 40 mg by mouth daily.      SYSTANE 0.4-0.3 % Soln   Generic drug: Polyethyl Glycol-Propyl Glycol   Place 1 drop into both eyes 2 (two) times daily. Administer 10 minutes after Cosopt      Tamsulosin HCl 0.4 MG Caps   Commonly known as: FLOMAX   Take 1 capsule (0.4 mg total) by mouth daily after supper.         Discharge Instructions:  The patient is to refrain from driving, heavy lifting or strenuous activity.  May shower daily and clean incisions with soap and water.  May resume regular diet.   Follow Up:  . Follow-up Information    Follow up with Milford Cage, MD. On 04/09/2012. (11:00 am for catheter removal.)    Contact information:    [address]LO[address] [facility] [place] [facility] [place] [address] [phone]       Follow up with Quintella Reichert, MD. Schedule an appointment as soon as possible for a visit in 2 weeks.   Contact information:   [address] [place] [address] [phone]       Follow up with [name], MD. On 04/17/2012. (Have a chest x-ray at 11:30, then see MD at 12:30)    Contact information:   [address] [place] [address] [phone]       Schedule an appointment as soon as possible for a visit with Sadie Haber, MD.   Contact information:   [address] [facility] [place] [address] [phone]            Syana Degraffenreid H 03/27/2012, 8:58 AM

## 2012-03-27 NOTE — Progress Notes (Signed)
Clinical Child psychotherapist (CSW) received [employer] authorization for transport [facility] #[id number]). CSW to facilitate with dc to [facility] if pt is stable today. Pt wife aware and agreeable.  FL2 in chart to be signed by MD.  Theresia Bough, MSW, LCSWA [phone]

## 2012-03-27 NOTE — Progress Notes (Addendum)
Pt to d/c to SNF. Reviewed d/c instructions with Pt & spouse. D/c central line, foley remained per Dr order.

## 2012-04-18 ENCOUNTER — Other Ambulatory Visit: Payer: Self-pay | Admitting: Cardiothoracic Surgery

## 2012-04-18 DIAGNOSIS — Z951 Presence of aortocoronary bypass graft: Secondary | ICD-10-CM

## 2012-04-19 ENCOUNTER — Ambulatory Visit (INDEPENDENT_AMBULATORY_CARE_PROVIDER_SITE_OTHER): Payer: Self-pay | Admitting: Cardiothoracic Surgery

## 2012-04-19 ENCOUNTER — Encounter: Payer: Self-pay | Admitting: Cardiothoracic Surgery

## 2012-04-19 ENCOUNTER — Ambulatory Visit
Admission: RE | Admit: 2012-04-19 | Discharge: 2012-04-19 | Disposition: A | Discharge status: Home / Self Care | Payer: PRIVATE HEALTH INSURANCE | Source: Ambulatory Visit | Attending: Cardiothoracic Surgery | Admitting: Cardiothoracic Surgery

## 2012-04-19 VITALS — BP 118/70 | HR 74 | Resp 16 | Ht 70.0 in | Wt 250.0 lb

## 2012-04-19 DIAGNOSIS — Z951 Presence of aortocoronary bypass graft: Secondary | ICD-10-CM

## 2012-04-19 NOTE — Progress Notes (Signed)
[address]            [place] [address]          [phone]       AISEA STOLTZFOOS [facility] Medical Record #[id number] Date of Birth: [birth year]  Quintella Reichert, MD Georgann Housekeeper, MD  Chief Complaint:   PostOp Follow Up Visit 03/05/2012    OPERATIVE REPORT  PREOPERATIVE DIAGNOSES: Coronary occlusive disease with unstable angina  and left main obstruction, renal insufficiency.  POSTOPERATIVE DIAGNOSES: Coronary occlusive disease with unstable  angina and left main obstruction, renal insufficiency with probable  preoperative retroperitoneal hematoma.  PROCEDURE PERFORMED: Coronary artery bypass grafting x4 with left  internal mammary to the left anterior descending coronary artery,  reverse saphenous vein graft to the diagonal coronary artery, sequential  reverse saphenous vein graft to the first obtuse marginal and distal  circumflex with bilateral thigh endovein harvesting.   History of Present Illness:     Slow improvement after long post op course complicated by pre op left retroperitoneal hematoma, with pre op renal insufficiency and post op urinary retention. Still with peripheral edema, walking 40-50 feet. Foley is now.      History  Smoking status  . Former Smoker  . Quit date: 11/17/1989  Smokeless tobacco  . Former User       No Known Allergies  Current Outpatient Prescriptions  Medication Sig Dispense Refill  . albuterol (PROVENTIL HFA;VENTOLIN HFA) 108 (90 BASE) MCG/ACT inhaler Inhale 1-2 puffs into the lungs every 6 (six) hours as needed. For shortness of breath      . amiodarone (PACERONE) 200 MG tablet Take 1 tablet (200 mg total) by mouth daily.      Marland Kitchen aspirin 325 MG tablet Take 325 mg by mouth daily.      Marland Kitchen atorvastatin (LIPITOR) 40 MG tablet Take 1 tablet (40 mg total) by mouth daily.      . brimonidine (ALPHAGAN) 0.15 % ophthalmic solution Place 1 drop into the left eye 2 (two) times daily.       .  Calcium Citrate-Vitamin D (CALCIUM CITRATE + D PO) Take 1 tablet by mouth daily. Calcium Citrate with Vitamin D 630mg /500iu      . cholecalciferol (VITAMIN D) 1000 UNITS tablet Take 1,000 Units by mouth daily.      . citalopram (CELEXA) 20 MG tablet Take 1 tablet (20 mg total) by mouth daily.      . dorzolamide-timolol (COSOPT) 22.3-6.8 MG/ML ophthalmic solution Place 1 drop into the left eye 2 (two) times daily. Administer 5 minutes after Lotemax and Alphagan      . erythromycin ophthalmic ointment Place 1 application into the right eye 2 (two) times daily. Administer 10 minutes after Systane      . ezetimibe (ZETIA) 10 MG tablet Take 10 mg by mouth daily.      . feeding supplement (GLUCERNA SHAKE) LIQD Take 237 mLs by mouth 2 (two) times daily between meals.      Marland Kitchen FIBER FORMULA PO Take 1,000 mg by mouth 2 (two) times daily.      . furosemide (LASIX) 40 MG tablet Take 40 mg by mouth daily.      . insulin glargine (LANTUS) 100 UNIT/ML injection Inject 20 Units into the skin 2 (two) times daily.  10 mL    . insulin lispro (HUMALOG) 100 UNIT/ML injection Inject 5 Units  into the skin 3 (three) times daily before meals. For CBG>150      . ipratropium (ATROVENT) 0.03 % nasal spray Place 2 sprays into the nose 2 (two) times daily.      Marland Kitchen loteprednol (LOTEMAX) 0.5 % ophthalmic suspension Place 1 drop into the right eye 2 (two) times daily.      . metoprolol tartrate (LOPRESSOR) 12.5 mg TABS Take 0.5 tablets (12.5 mg total) by mouth 2 (two) times daily.      . Multiple Vitamin (MULTIVITAMIN WITH MINERALS) TABS Take 1 tablet by mouth daily.      Marland Kitchen oxyCODONE (OXY IR/ROXICODONE) 5 MG immediate release tablet Take 1-2 tablets (5-10 mg total) by mouth every 3 (three) hours as needed for pain.  30 tablet  0  . Polyethyl Glycol-Propyl Glycol (SYSTANE) 0.4-0.3 % SOLN Place 1 drop into both eyes 2 (two) times daily. Administer 10 minutes after Cosopt      . polyethylene glycol (MIRALAX / GLYCOLAX) packet Take 17 g  by mouth as needed.      . pantoprazole (PROTONIX) 40 MG tablet Take 40 mg by mouth daily.      . Tamsulosin HCl (FLOMAX) 0.4 MG CAPS Take 1 capsule (0.4 mg total) by mouth daily after supper.  30 capsule         Physical Exam: BP 118/70  Pulse 74  Resp 16  Ht 5\' 10"  (1.778 m)  Wt 250 lb (113.399 kg)  BMI 35.87 kg/m2  SpO2 95%  General appearance: alert, cooperative and no distress Neurologic: intact Heart: regular rate and rhythm, S1, S2 normal, no murmur, click, rub or gallop and normal apical impulse Lungs: clear to auscultation bilaterally Abdomen: soft, non-tender; bowel sounds normal; no masses,  no organomegaly Extremities: edema bilaterial leg edema but better then when in hospital Wound: sternum stable and well healed   Diagnostic Studies & Laboratory data:         Recent Radiology Findings: Dg Chest 2 View  04/19/2012  *RADIOLOGY REPORT*  Clinical Data: Post CABG 4 weeks ago, some shortness of breath  CHEST - 2 VIEW  Comparison: Portable chest x-ray of 03/25/2012  Findings: The lungs appear better aerated.  A small left pleural effusion remains with mild left basilar atelectasis.  Cardiomegaly is stable.  Median sternotomy sutures are noted.  No bony abnormality is seen.  IMPRESSION: Better aeration.  A small left pleural effusion remains with mild left basilar atelectasis.   Original Report Authenticated By: Dwyane Dee, M.D.       Recent Labs: Lab Results  Component Value Date   WBC 9.6 03/27/2012   HGB 9.6* 03/27/2012   HCT 30.7* 03/27/2012   PLT 135* 03/27/2012   GLUCOSE 167* 03/27/2012   ALT 49 03/22/2012   AST 25 03/22/2012   NA 138 03/27/2012   K 4.3 03/27/2012   CL 103 03/27/2012   CREATININE 1.67* 03/27/2012   BUN 40* 03/27/2012   CO2 27 03/27/2012   TSH 4.185 03/15/2012   INR 1.47 03/05/2012      Assessment / Plan:      Improving with rehab, still with pedal edema and difficulty with ambulating and strength, on Lasix 40 mg day Goal for home care  soon. Followed by Dr Mayford Knife and Dr Eliott Nine Will see prn       Kreed Kauffman B 04/19/2012 12:13 PM

## 2012-11-25 IMAGING — CR DG CHEST 2V
2 series · 2 of 2 positions shown · non-contrast
Comparison: 08/21/2007

CLINICAL DATA: Preop

CHEST - 2 VIEW

[w chest pa *]
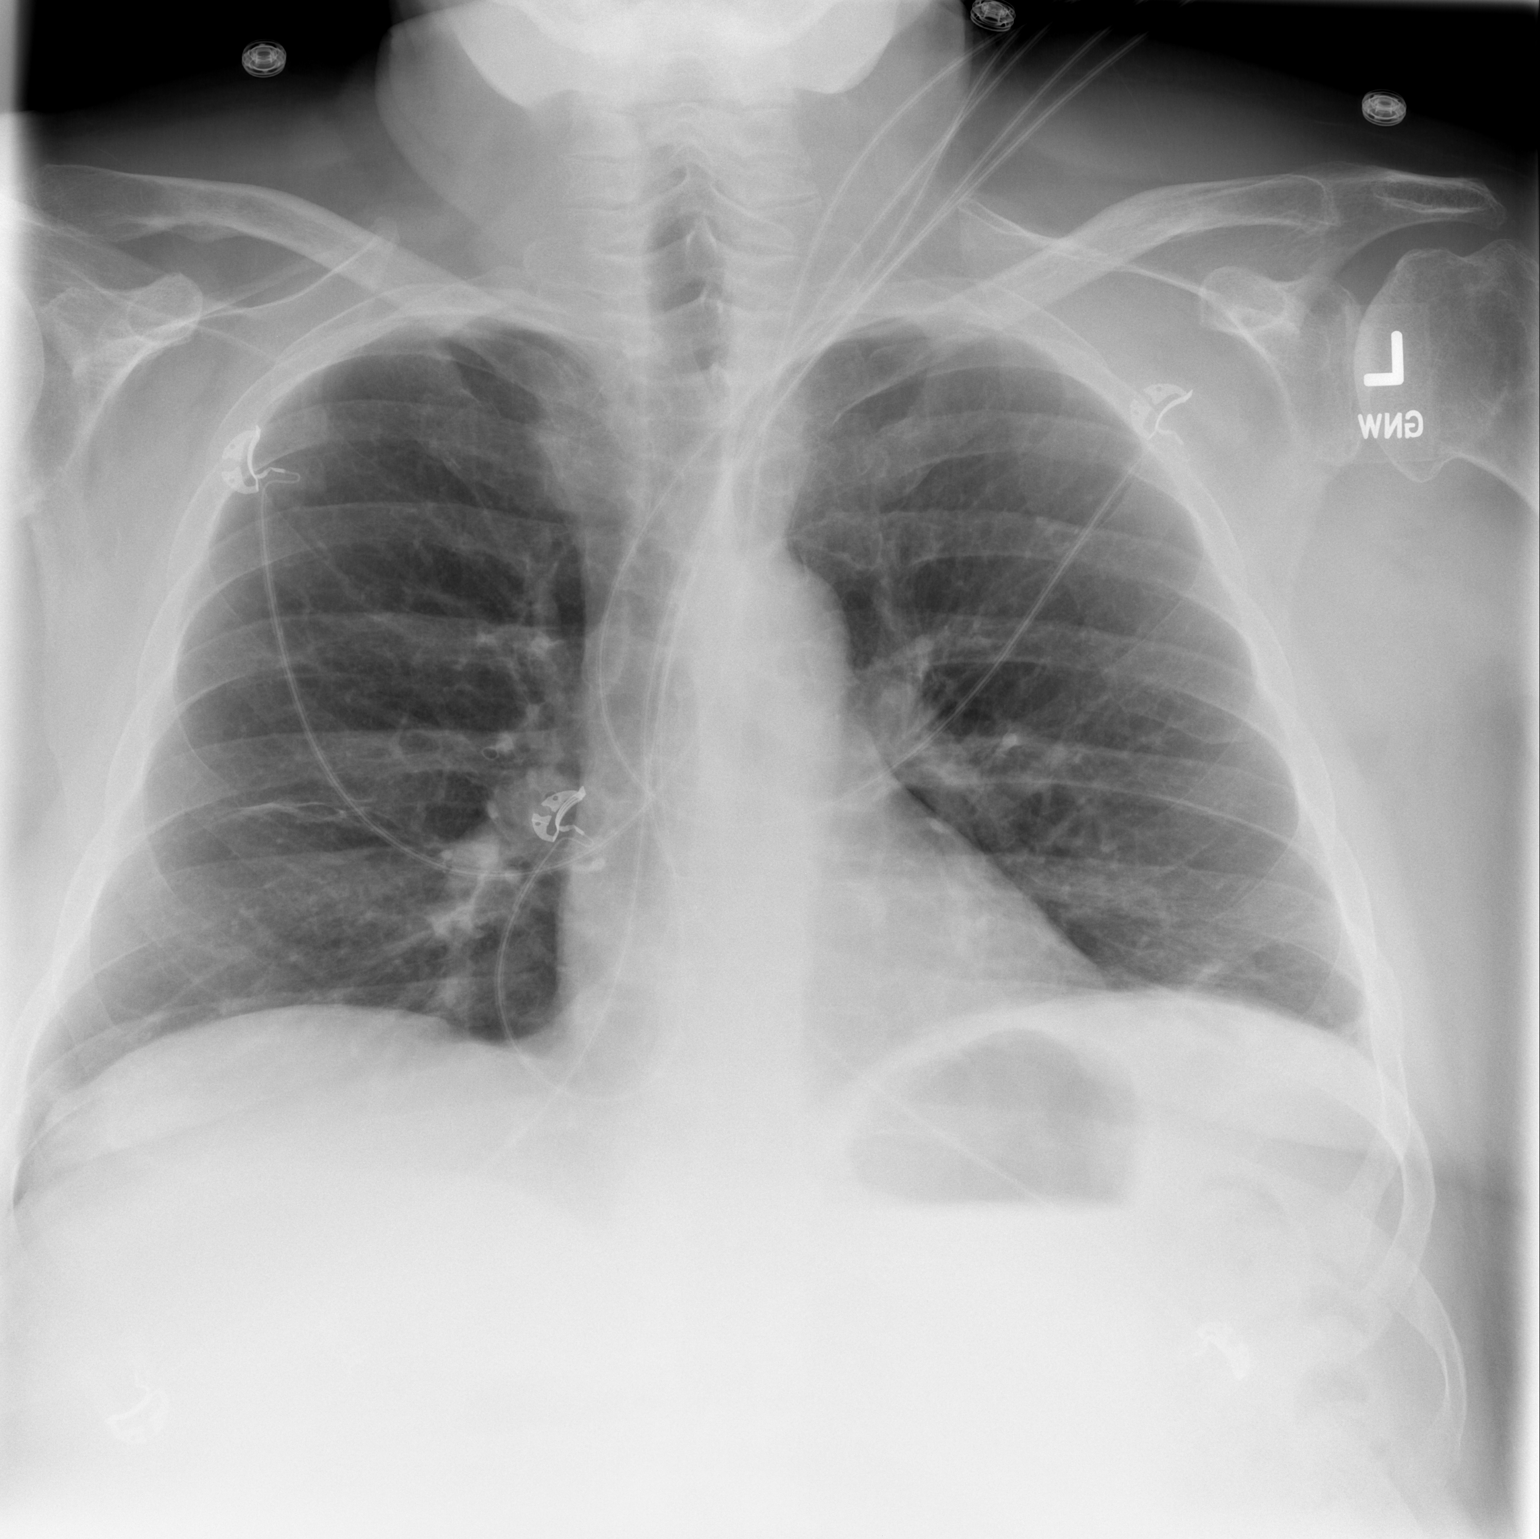

[w chest lat *]
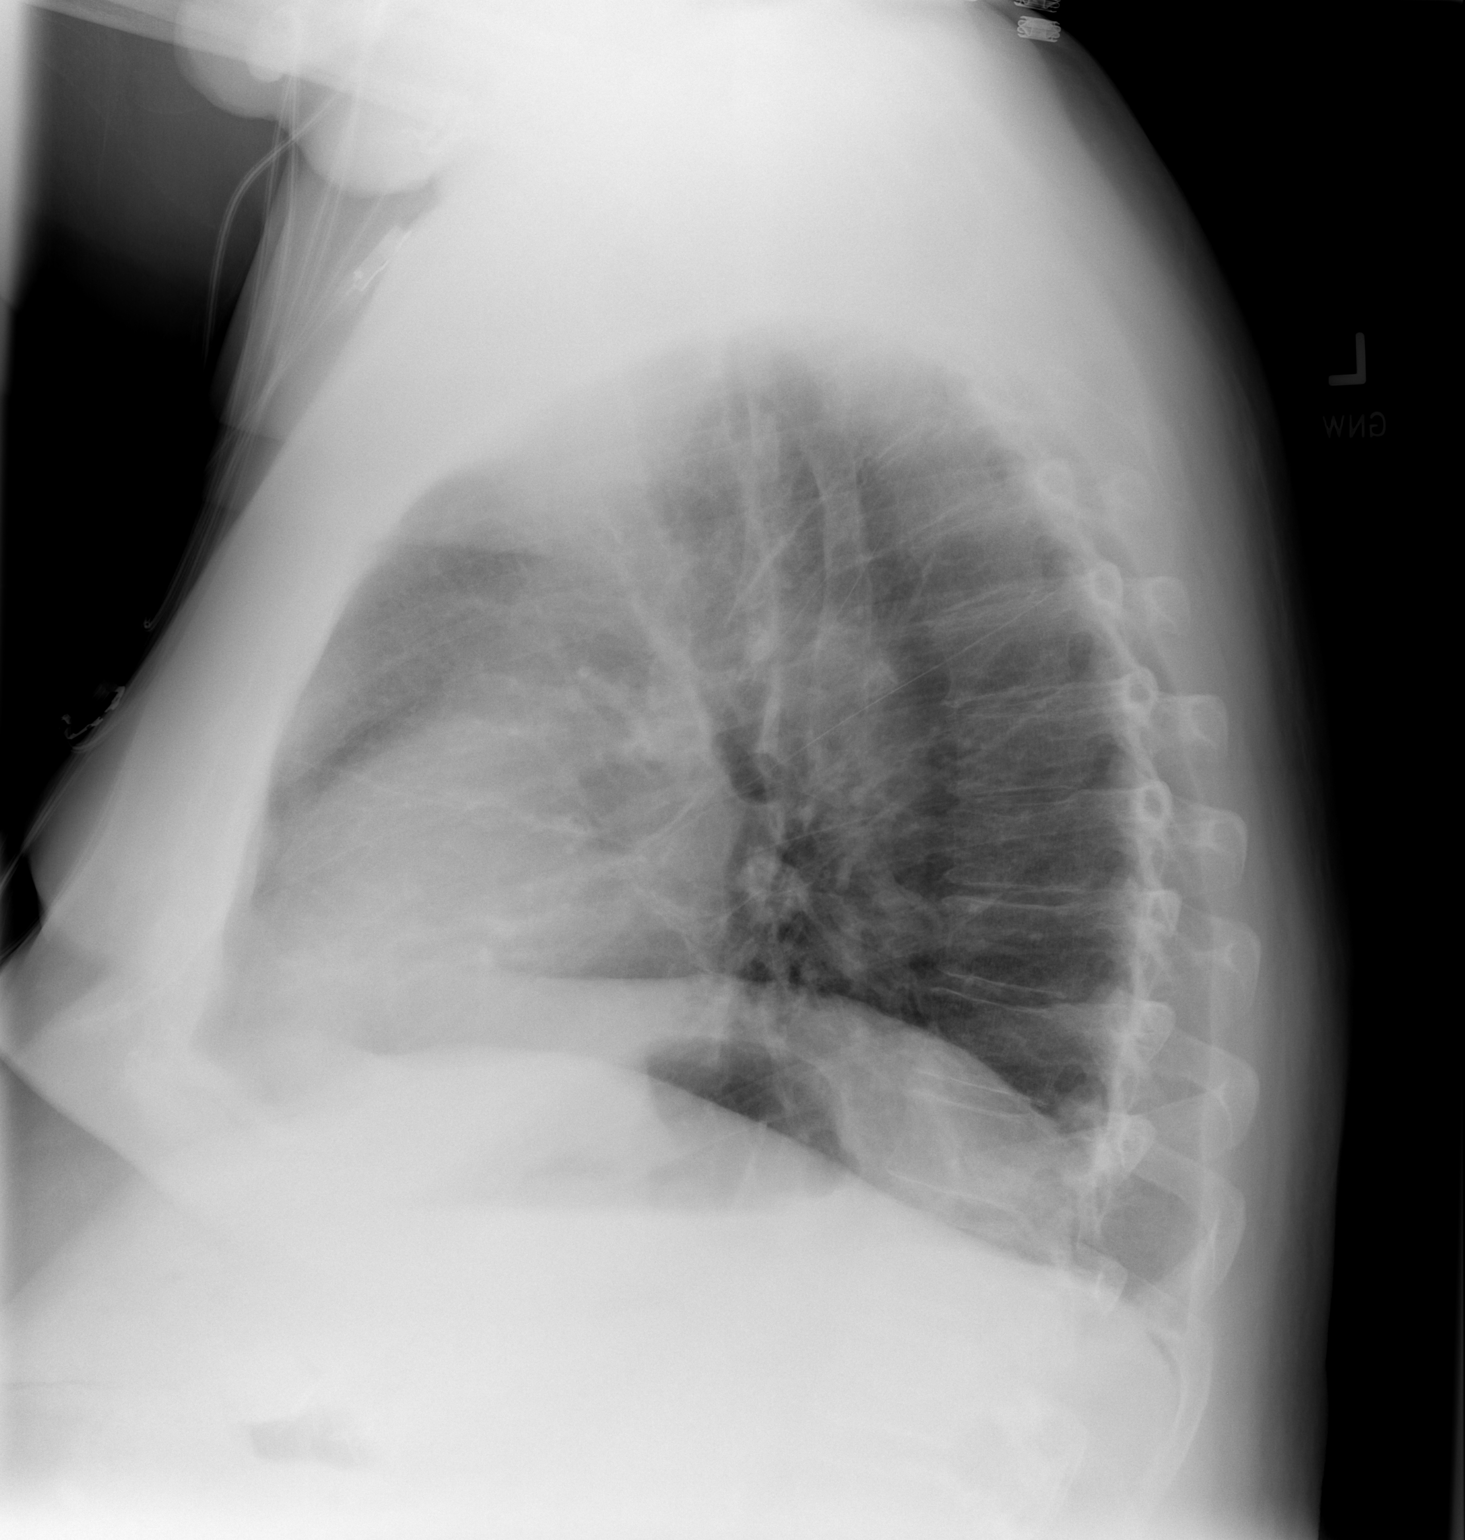

[2 of 2 positions shown; findings below may reference images not displayed]

FINDINGS: Normal heart size.  Lungs are under aerated with
bibasilar atelectasis.  No pneumothorax and no pleural effusion.
IMPRESSION: Bibasilar atelectasis.

## 2012-12-02 IMAGING — CR DG CHEST 1V PORT
1 series · 1 of 1 positions shown · non-contrast
Comparison: 03/08/2012.

CLINICAL DATA: Bypass surgery.

PORTABLE CHEST - 1 VIEW

[AP]
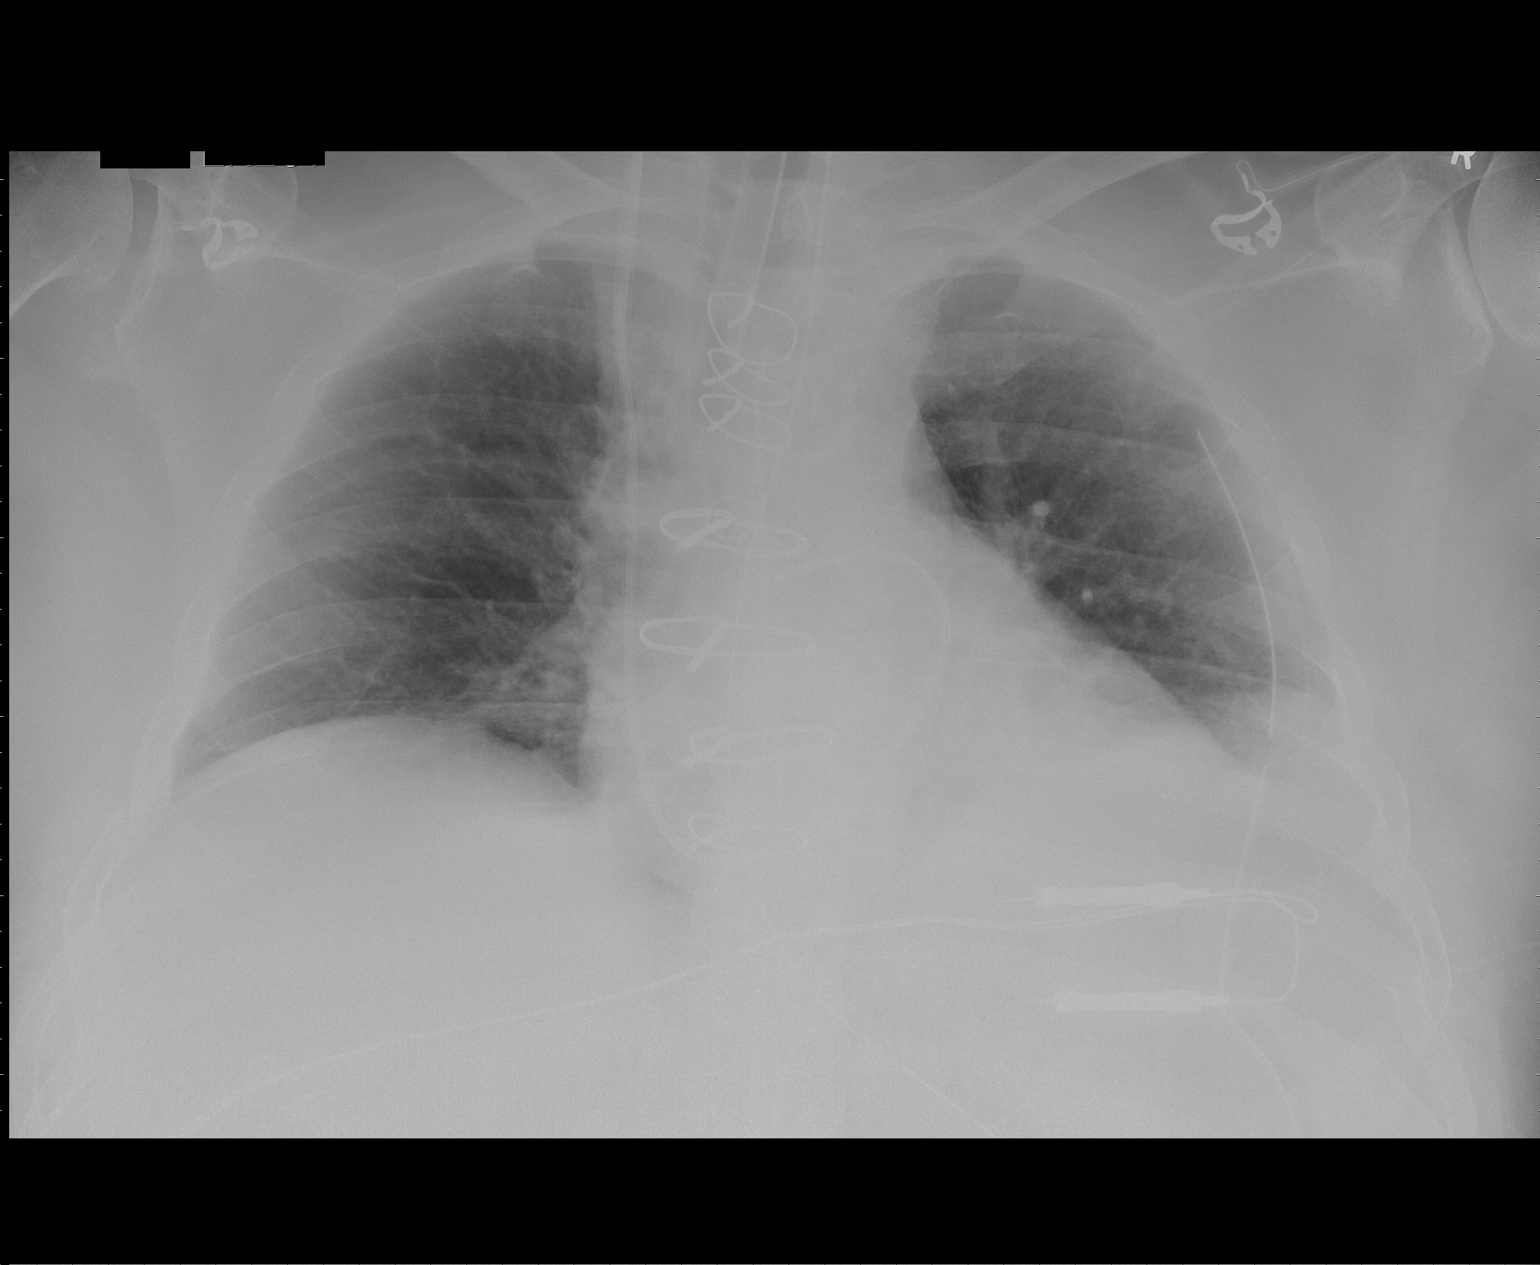

[1 of 1 positions shown; findings below may reference images not displayed]

FINDINGS: The endotracheal tube is in good position, 5 cm above the
carina.  The right IJ Swan-Ganz catheter tip was in the proximal
right pulmonary artery.  The NG tube is in the stomach.  There is a
left-sided chest tube in good position.  No pneumothorax.  Low lung
volumes with vascular crowding and atelectasis.  No edema,
effusions or  pneumothorax.
IMPRESSION: 1.  Support apparatus in good position without complicating
features.
2.  Low lung volumes with vascular crowding and atelectasis.

## 2012-12-04 IMAGING — CR DG CHEST 1V PORT
1 series · 1 of 1 positions shown · non-contrast
Comparison: 03/06/2012.

CLINICAL DATA: Postop CABG 03/05/2012.

PORTABLE CHEST - 1 VIEW

[AP]
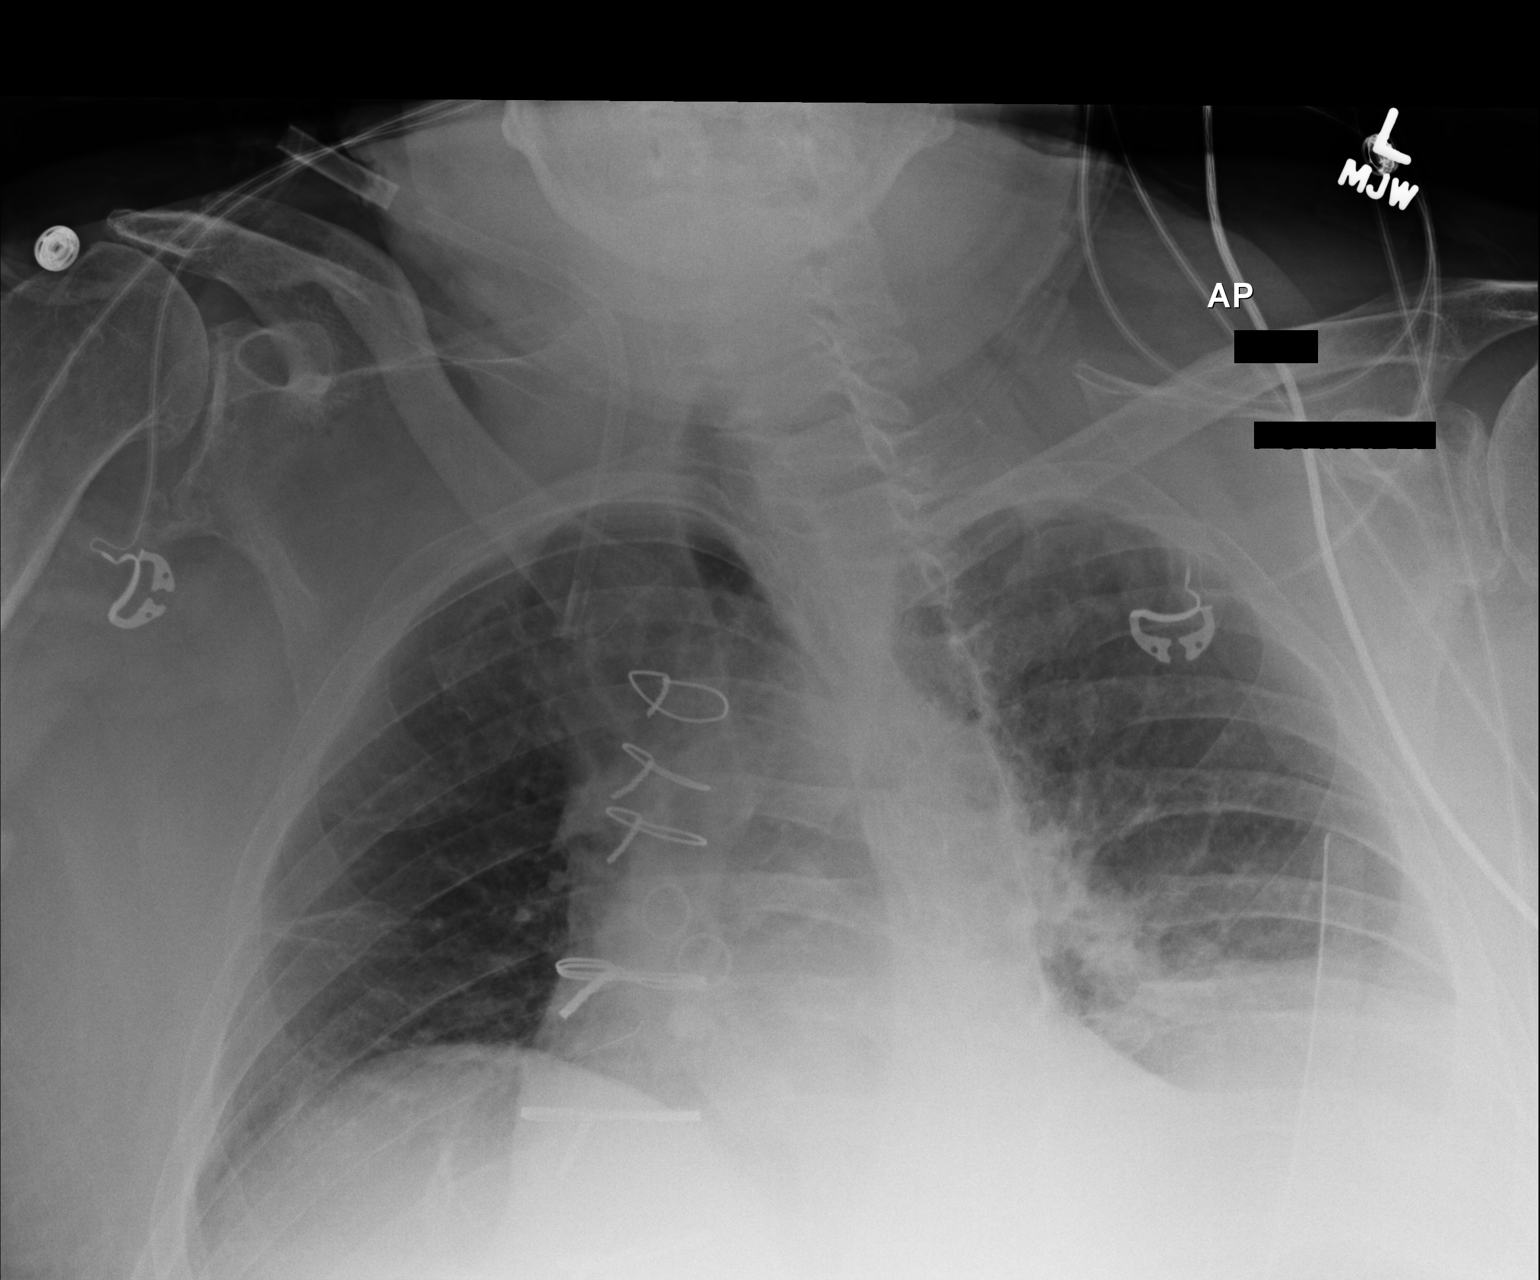

[1 of 1 positions shown; findings below may reference images not displayed]

FINDINGS: 9559 hours.  Interval extubation with removal of the
nasogastric tube and right IJ Swan-Ganz catheter.  A right IJ
sheath and left chest tube remain in place.  There are persistent
low lung volumes with bibasilar atelectasis and a possible small
left pleural effusion.  No pneumothorax is evident.  Heart size and
mediastinal contours are stable allowing for rotation and the low
lung volumes.  Glenohumeral degenerative changes are noted.
IMPRESSION: No significant change in bibasilar atelectasis following
extubation.  No pneumothorax.

## 2012-12-06 IMAGING — CR DG CHEST 1V PORT
1 series · 1 of 1 positions shown · non-contrast
Comparison: Portable chest x-rays yesterday and dating back to
03/05/2012.

CLINICAL DATA: Postop CABG.

PORTABLE CHEST - 1 VIEW

[AP]
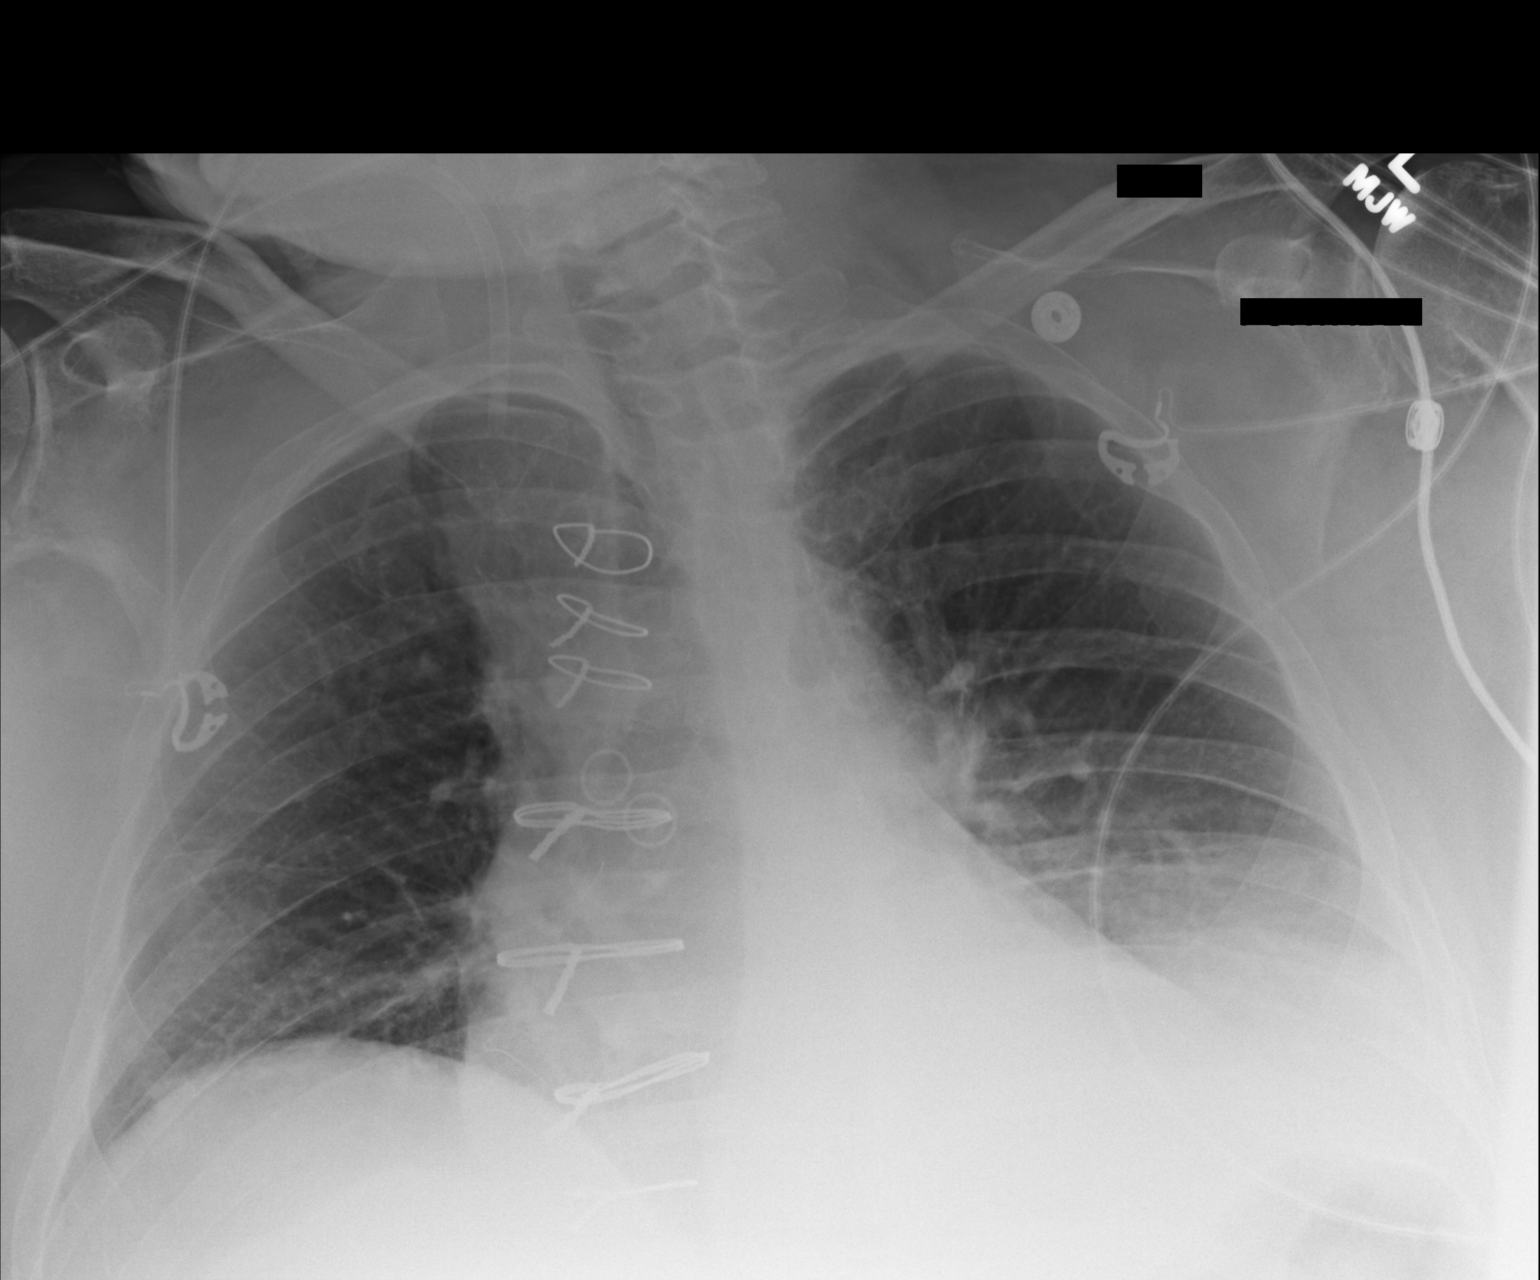

[1 of 1 positions shown; findings below may reference images not displayed]

FINDINGS: Sternotomy for CABG.  Cardiac silhouette enlarged but
stable.  Improved pulmonary venous hypertension, with normal
pulmonary vascularity currently.  Improved aeration in the right
lung base, with minimal atelectasis persisting.  Stable dense
consolidation in the left lower lobe.  Stable bilateral pleural
effusions, left greater than right.  No new pulmonary parenchymal
abnormalities.  Right jugular introducer sheath tip remains in the
lower IJ.
IMPRESSION: Stable dense left lower lobe atelectasis and/or pneumonia and
moderate-sized left pleural effusion.  Improved aeration in the
right lung base, with mild atelectasis persisting.  No new
abnormalities.

## 2013-03-11 ENCOUNTER — Other Ambulatory Visit: Payer: Self-pay | Admitting: Cardiology

## 2013-03-11 DIAGNOSIS — E782 Mixed hyperlipidemia: Secondary | ICD-10-CM

## 2013-03-11 DIAGNOSIS — Z79899 Other long term (current) drug therapy: Secondary | ICD-10-CM

## 2013-03-20 ENCOUNTER — Other Ambulatory Visit (INDEPENDENT_AMBULATORY_CARE_PROVIDER_SITE_OTHER): Payer: Medicare Other

## 2013-03-20 DIAGNOSIS — Z79899 Other long term (current) drug therapy: Secondary | ICD-10-CM

## 2013-03-20 DIAGNOSIS — E782 Mixed hyperlipidemia: Secondary | ICD-10-CM

## 2013-03-20 LAB — LIPID PANEL
Cholesterol: 120 mg/dL (ref 0–200)
HDL: 46.3 mg/dL (ref >39.00)
VLDL: 29.2 mg/dL (ref 0.0–40.0)

## 2013-04-02 ENCOUNTER — Other Ambulatory Visit: Payer: Self-pay | Admitting: *Deleted

## 2013-04-02 DIAGNOSIS — N186 End stage renal disease: Secondary | ICD-10-CM

## 2013-04-02 DIAGNOSIS — Z01810 Encounter for preprocedural cardiovascular examination: Secondary | ICD-10-CM

## 2013-04-23 ENCOUNTER — Ambulatory Visit: Payer: Medicare Other | Admitting: Vascular Surgery

## 2013-04-23 ENCOUNTER — Encounter (HOSPITAL_COMMUNITY): Payer: Medicare Other

## 2013-04-23 ENCOUNTER — Other Ambulatory Visit (HOSPITAL_COMMUNITY): Payer: Medicare Other

## 2013-05-13 ENCOUNTER — Encounter: Payer: Self-pay | Admitting: Vascular Surgery

## 2013-05-14 ENCOUNTER — Ambulatory Visit (INDEPENDENT_AMBULATORY_CARE_PROVIDER_SITE_OTHER): Payer: Medicare Other | Admitting: Vascular Surgery

## 2013-05-14 ENCOUNTER — Ambulatory Visit (INDEPENDENT_AMBULATORY_CARE_PROVIDER_SITE_OTHER)
Admission: RE | Admit: 2013-05-14 | Discharge: 2013-05-14 | Disposition: A | Discharge status: Home / Self Care | Payer: Medicare Other | Source: Ambulatory Visit | Attending: Vascular Surgery | Admitting: Vascular Surgery

## 2013-05-14 ENCOUNTER — Ambulatory Visit (HOSPITAL_COMMUNITY)
Admission: RE | Admit: 2013-05-14 | Discharge: 2013-05-14 | Disposition: A | Discharge status: Home / Self Care | Payer: Medicare Other | Source: Ambulatory Visit | Attending: Vascular Surgery | Admitting: Vascular Surgery

## 2013-05-14 ENCOUNTER — Encounter: Payer: Self-pay | Admitting: Vascular Surgery

## 2013-05-14 VITALS — BP 128/78 | HR 80 | Resp 16 | Ht 70.0 in | Wt 265.0 lb

## 2013-05-14 DIAGNOSIS — Z01810 Encounter for preprocedural cardiovascular examination: Secondary | ICD-10-CM

## 2013-05-14 DIAGNOSIS — N186 End stage renal disease: Secondary | ICD-10-CM
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
## 2013-05-15 ENCOUNTER — Encounter: Payer: Self-pay | Admitting: Nephrology

## 2013-06-05 ENCOUNTER — Encounter: Payer: Self-pay | Admitting: General Surgery

## 2013-06-05 DIAGNOSIS — I10 Essential (primary) hypertension: Secondary | ICD-10-CM | POA: Insufficient documentation

## 2013-06-05 DIAGNOSIS — I5032 Chronic diastolic (congestive) heart failure: Secondary | ICD-10-CM | POA: Insufficient documentation

## 2013-06-05 DIAGNOSIS — Z79899 Other long term (current) drug therapy: Secondary | ICD-10-CM | POA: Insufficient documentation

## 2013-06-05 DIAGNOSIS — E782 Mixed hyperlipidemia: Secondary | ICD-10-CM | POA: Insufficient documentation

## 2013-06-06 ENCOUNTER — Ambulatory Visit (INDEPENDENT_AMBULATORY_CARE_PROVIDER_SITE_OTHER): Payer: Medicare Other | Admitting: Cardiology

## 2013-06-06 ENCOUNTER — Encounter: Payer: Self-pay | Admitting: Cardiology

## 2013-06-06 VITALS — BP 142/88 | HR 73 | Ht 70.0 in

## 2013-06-06 DIAGNOSIS — I2510 Atherosclerotic heart disease of native coronary artery without angina pectoris: Secondary | ICD-10-CM

## 2013-06-06 DIAGNOSIS — I10 Essential (primary) hypertension: Secondary | ICD-10-CM

## 2013-06-06 DIAGNOSIS — I5032 Chronic diastolic (congestive) heart failure: Secondary | ICD-10-CM

## 2013-06-06 DIAGNOSIS — I4891 Unspecified atrial fibrillation: Secondary | ICD-10-CM | POA: Insufficient documentation

## 2013-06-06 DIAGNOSIS — E782 Mixed hyperlipidemia: Secondary | ICD-10-CM

## 2013-06-06 LAB — BASIC METABOLIC PANEL
CO2: 31 mEq/L (ref 19–32)
Calcium: 9.4 mg/dL (ref 8.4–10.5)
Chloride: 93 mEq/L — ABNORMAL LOW (ref 96–112)
Creatinine, Ser: 2.6 mg/dL — ABNORMAL HIGH (ref 0.4–1.5)
Glucose, Bld: 198 mg/dL — ABNORMAL HIGH (ref 70–99)
Sodium: 131 mEq/L — ABNORMAL LOW (ref 135–145)

## 2013-06-06 NOTE — Progress Notes (Signed)
[address] [place], [place]  [address] Phone: [phone] Fax:  [phone]  Date:  06/06/2013   ID:  [name], DOB Apr 11, 1941, MRN [id number]  PCP:  Georgann Housekeeper, MD  Cardiologist:  Armanda Magic, MD     History of Present Illness: [name] is a 72 y.o. male with a history of ASCAD, HTN, dyslipidemia who presents today for followup.  He is doing well from a cardiac standpoint.  He denies any chest pain, SOB, DOE,  dizziness, palpitations or syncope.  He has chronic LE edema.   Wt Readings from Last 3 Encounters:  05/14/13 265 lb (120.203 kg)  04/19/12 250 lb (113.399 kg)  03/27/12 265 lb 10.5 oz (120.5 kg)     Past Medical History  Diagnosis Date  . Myocardial infarction   . Chronic kidney disease     stage 3  . Hypertension   . Depression   . COPD (chronic obstructive pulmonary disease)   . Cardiomyopathy     Mild Ischemic-LVEF 40-45%  . GERD (gastroesophageal reflux disease)   . Headache(784.0)   . Arthritis   . Lipoma   . Hearing loss   . Gastroparesis   . Hemorrhoids   . Colon polyps   . Diabetes mellitus     insulin dependent with neuropathy, nephropathy, retinopathy  . Blindness of right eye     due to diabetic retinopathy  . CHF (congestive heart failure)     mild ischemic DCM EF 40-45% by cath 2006  . Obesity     marked centripetal obesity  . Depression     mood disorder  . Hearing loss   . Fistula     Right arm nonfunctional  . BPH with urinary obstruction     urinary flow problem now on flomax  . Coronary artery disease 9/13    NSTEMI 2006 with PCI DES left circ 11/18/2004 with residual 50-60% diagonal, 60% LAD, 40% distal RCA.  Nuclear stress test 8/13 with inferior ischemia s/p cath with 80% distal LM, 95% ostial D1, 70% mid LAD stenosis, 95% ostial OM2 s/p CABG with LIMA to LAD, SVG to OM/left circ and SVG to diag  . Postoperative atrial fibrillation     Current Outpatient Prescriptions  Medication Sig Dispense  Refill  . albuterol (PROVENTIL HFA;VENTOLIN HFA) 108 (90 BASE) MCG/ACT inhaler Inhale 1-2 puffs into the lungs every 6 (six) hours as needed. For shortness of breath      . aspirin 325 MG tablet Take 325 mg by mouth daily.      Marland Kitchen atorvastatin (LIPITOR) 40 MG tablet Take 1 tablet (40 mg total) by mouth daily.      . brimonidine (ALPHAGAN) 0.15 % ophthalmic solution Place 1 drop into the left eye 2 (two) times daily.       . Calcium Citrate-Vitamin D (CALCIUM CITRATE + D PO) Take 1 tablet by mouth daily. Calcium Citrate with Vitamin D 630mg /500iu      . cholecalciferol (VITAMIN D) 1000 UNITS tablet Take 1,000 Units by mouth daily.      . citalopram (CELEXA) 20 MG tablet Take 1 tablet (20 mg total) by mouth daily.      . dorzolamide-timolol (COSOPT) 22.3-6.8 MG/ML ophthalmic solution Place 1 drop into the left eye 2 (two) times daily. Administer 5 minutes after Lotemax and Alphagan      . feeding supplement (GLUCERNA SHAKE) LIQD Take 237 mLs by mouth 2 (two) times daily between meals.      Marland Kitchen  FIBER FORMULA PO Take 1,000 mg by mouth 2 (two) times daily.      . finasteride (PROSCAR) 5 MG tablet 5 mg daily.      . furosemide (LASIX) 80 MG tablet Take 80 mg by mouth daily. c heck for swelling at [facility] if swelling add 1/2 tablet, othertwise once daily.      . insulin glargine (LANTUS) 100 UNIT/ML injection Inject 20 Units into the skin 2 (two) times daily.  10 mL    . insulin lispro (HUMALOG) 100 UNIT/ML injection Inject 5 Units into the skin 3 (three) times daily before meals. For CBG>150      . ipratropium (ATROVENT) 0.03 % nasal spray Place 2 sprays into the nose 2 (two) times daily.      Marland Kitchen loteprednol (LOTEMAX) 0.5 % ophthalmic suspension Place 1 drop into the right eye 2 (two) times daily.      . metoprolol tartrate (LOPRESSOR) 25 MG tablet Take 25 mg by mouth 2 (two) times daily. Takes 1/2 tablet bid daily.      . midodrine (PROAMATINE) 5 MG tablet Take 5 mg by mouth daily. Take 1/2 tablet daily      .  Multiple Vitamin (MULTIVITAMIN WITH MINERALS) TABS Take 1 tablet by mouth daily.      Marland Kitchen oxyCODONE (OXY IR/ROXICODONE) 5 MG immediate release tablet Take 1-2 tablets (5-10 mg total) by mouth every 3 (three) hours as needed for pain.  30 tablet  0  . pantoprazole (PROTONIX) 40 MG tablet Take 40 mg by mouth daily.      Bertram Gala Glycol-Propyl Glycol (SYSTANE) 0.4-0.3 % SOLN Place 1 drop into both eyes 2 (two) times daily. Administer 10 minutes after Cosopt      . risperiDONE (RISPERDAL) 1 MG tablet 1 mg 2 (two) times daily.      . Tamsulosin HCl (FLOMAX) 0.4 MG CAPS Take 1 capsule (0.4 mg total) by mouth daily after supper.  30 capsule    . metoprolol tartrate (LOPRESSOR) 12.5 mg TABS tablet Take 25 mg by mouth 2 (two) times daily.       No current facility-administered medications for this visit.    Allergies:   No Known Allergies  Social History:  The patient  reports that he quit smoking about 23 years ago. His smoking use included Cigarettes. He smoked 0.00 packs per day. He quit smokeless tobacco use about 26 years ago. He reports that he does not drink alcohol or use illicit drugs.   Family History:  The patient's family history is not on file.   ROS:  Please see the history of present illness.      All other systems reviewed and negative.   PHYSICAL EXAM: VS:  BP 142/88  Pulse 73  Ht 5\' 10"  (1.778 m) Well nourished, well developed, in no acute distress HEENT: normal Neck: no JVD Cardiac:  normal S1, S2; RRR; no murmur Lungs:  clear to auscultation bilaterally, no wheezing, rhonchi or rales Abd: soft, nontender, no hepatomegaly Ext: 1+ edema Skin: warm and dry Neuro:  CNs 2-12 intact, no focal abnormalities noted  EKG:  NSR with RBBB and old IWMA      ASSESSMENT AND PLAN:  1.  ASCAD   - continue ASA 2.  HTN - well controlled  - continue metoprolol 3.  Dyslipidemia - his lipids are at goal  - continue atorvastatin 4.  Chronic diastolic CHF well compensated with 1+  edema but sits all day with his legs dependent  -  continue Lasix/beta blocker  - check BMET  Followup with me in 6 months  Signed, Armanda Magic, MD 06/06/2013 3:17 PM

## 2013-06-06 NOTE — Patient Instructions (Signed)
Your physician recommends that you continue on your current medications as directed. Please refer to the Current Medication list given to you today.  Your physician recommends that you go to the lab today for a BMET  Your physician wants you to follow-up in: 6 Months with Dr Turner You will receive a reminder letter in the mail two months in advance. If you don't receive a letter, please call our office to schedule the follow-up appointment.     

## 2013-06-10 NOTE — Progress Notes (Signed)
LVM for pt to return call

## 2013-07-21 DEATH — deceased

## 2013-08-15 ENCOUNTER — Other Ambulatory Visit: Payer: Self-pay | Admitting: *Deleted

## 2013-08-23 ENCOUNTER — Encounter (HOSPITAL_COMMUNITY): Payer: Self-pay | Admitting: Pharmacy Technician

## 2013-08-28 ENCOUNTER — Telehealth: Payer: Self-pay | Admitting: *Deleted

## 2013-08-28 ENCOUNTER — Encounter (HOSPITAL_COMMUNITY)
Admission: RE | Admit: 2013-08-28 | Discharge: 2013-08-28 | Disposition: A | Discharge status: Home / Self Care | Payer: Medicare Other | Source: Ambulatory Visit | Attending: Anesthesiology | Admitting: Anesthesiology

## 2013-08-28 ENCOUNTER — Encounter (HOSPITAL_COMMUNITY): Payer: Self-pay

## 2013-08-28 ENCOUNTER — Encounter (HOSPITAL_COMMUNITY)
Admission: RE | Admit: 2013-08-28 | Discharge: 2013-08-28 | Disposition: A | Discharge status: Home / Self Care | Payer: Medicare Other | Source: Ambulatory Visit | Attending: Vascular Surgery | Admitting: Vascular Surgery

## 2013-08-28 DIAGNOSIS — Z01812 Encounter for preprocedural laboratory examination: Secondary | ICD-10-CM | POA: Insufficient documentation

## 2013-08-28 DIAGNOSIS — Z01818 Encounter for other preprocedural examination: Secondary | ICD-10-CM | POA: Insufficient documentation

## 2013-08-28 HISTORY — DX: Anemia, unspecified: D64.9

## 2013-08-28 NOTE — Telephone Encounter (Signed)
Ulis Rias (pre-op nurse from Short Stay at [facility]) calling to report that [name] had recent falls (two) last week and had "sores on both arms that are draining."  Ulis Rias questioned if [name] needed to be seen by our office.  Advised her to have [name] see his primary care physician for evaluation.

## 2013-08-28 NOTE — Progress Notes (Signed)
08/28/13 1317  OBSTRUCTIVE SLEEP APNEA  Have you ever been diagnosed with sleep apnea through a sleep study? (?yrs ago neg)  Do you snore loudly (loud enough to be heard through closed doors)?  0  Do you often feel tired, fatigued, or sleepy during the daytime? 0  Has anyone observed you stop breathing during your sleep? 0  Do you have, or are you being treated for high blood pressure? 1  BMI more than 35 kg/m2? 1  Age over 75 years old? 1  Neck circumference greater than 40 cm/18 inches? 1 (20)  Gender: 1  Obstructive Sleep Apnea Score 5  Score 4 or greater  Results sent to PCP
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
## 2013-08-28 NOTE — Pre-Procedure Instructions (Addendum)
[name]  08/28/2013   Your procedure is scheduled on:  09/04/13  Report to [facility] short stay admitting at 67 AM.  Call this number if you have problems the morning of surgery: [phone]   Remember:   Do not eat food or drink liquids after midnight.   Take these medicines the morning of surgery with A SIP OF WATER:eye drops,inhaler as needed,aspirin, celexa, proscar, metoprolol, protonix         STOP all herbel meds, nsaids (aleve,naproxen,advil,ibuprofen) 5 days prior to surgery (08/30/13) including vitamins, any over counter supplements     NO DIABETIC MEDS AM OF SURGERY   Do not wear jewelry, make-up or nail polish.  Do not wear lotions, powders, or perfumes. You may wear deodorant.  Do not shave 48 hours prior to surgery. Men may shave face and neck.  Do not bring valuables to the hospital.  [facility] is not responsible                  for any belongings or valuables.               Contacts, dentures or bridgework may not be worn into surgery.  Leave suitcase in the car. After surgery it may be brought to your room.  For patients admitted to the hospital, discharge time is determined by your                treatment team.               Patients discharged the day of surgery will not be allowed to drive  home.  Name and phone number of your driver:   Special Instructions:  Special Instructions: [place] - Preparing for Surgery  Before surgery, you can play an important role.  Because skin is not sterile, your skin needs to be as free of germs as possible.  You can reduce the number of germs on you skin by washing with CHG (chlorahexidine gluconate) soap before surgery.  CHG is an antiseptic cleaner which kills germs and bonds with the skin to continue killing germs even after washing.  Please DO NOT use if you have an allergy to CHG or antibacterial soaps.  If your skin becomes reddened/irritated stop using the CHG and inform your nurse when you arrive at Short  Stay.  Do not shave (including legs and underarms) for at least 48 hours prior to the first CHG shower.  You may shave your face.  Please follow these instructions carefully:   1.  Shower with CHG Soap the night before surgery and the morning of Surgery.  2.  If you choose to wash your hair, wash your hair first as usual with your normal shampoo.  3.  After you shampoo, rinse your hair and body thoroughly to remove the Shampoo.  4.  Use CHG as you would any other liquid soap.  You can apply chg directly  to the skin and wash gently with scrungie or a clean washcloth.  5.  Apply the CHG Soap to your body ONLY FROM THE NECK DOWN.  Do not use on open wounds or open sores.  Avoid contact with your eyes ears, mouth and genitals (private parts).  Wash genitals (private parts)       with your normal soap.  6.  Wash thoroughly, paying special attention to the area where your surgery will be performed.  7.  Thoroughly rinse your body with warm water from the neck  down.  8.  DO NOT shower/wash with your normal soap after using and rinsing off the CHG Soap.  9.  Pat yourself dry with a clean towel.            10.  Wear clean pajamas.            11.  Place clean sheets on your bed the night of your first shower and do not sleep with pets.  Day of Surgery  Do not apply any lotions/deodorants the morning of surgery.  Please wear clean clothes to the hospital/surgery center.   Please read over the following fact sheets that you were given: Pain Booklet, Coughing and Deep Breathing and Surgical Site Infection Prevention

## 2013-08-29 NOTE — Progress Notes (Signed)
Anesthesia Chart Review:  Patient is a 73 year old male scheduled for right arm basilic vein transposition on 09/04/13 by Dr. Donnetta Hutching.  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  BMI is 38 consistent with obesity. PCP is Dr. Wenda Low. Cardiologist is Dr. Fransico Him, last visit 06/06/13 with six month follow-up recommended.  EKG showed NSR, right BBB, old inferior infarct.  Echo on 03/01/12 showed: - Left ventricle: The cavity size was normal. There was mild concentric hypertrophy. Systolic function was normal. The estimated ejection fraction was in the range of 60% to 65%. Wall motion was normal; there were no regional wall motion abnormalities. Doppler parameters are consistent with abnormal left ventricular relaxation (grade 1 diastolic dysfunction). - Aortic valve: Mildly thickened, moderately calcified leaflets. Cusp separation was mildly reduced. Transvalvular velocity was minimally increased. There was no stenosis. - Mitral valve: Calcified annulus. Mildly thickened, mildly calcified leaflets . - Left atrium: The atrium was moderately dilated. - Right atrium: The atrium was mildly dilated.  His last stress and cath were prior to his CABG.  Reports available in Epic.   He had no significant carotid artery disease in 02/2012.  CXR on 3/11/5 showed no acute cardiopulmonary disease.  He is for labs on the day of surgery.  Patient is < 46 years old from his CABG and saw Dr. Radford Pax just three months ago.  If no acute changes and labs are acceptable then I anticipate that he can proceed as planned.  George Hugh [facility] Short Stay Center/Anesthesiology Phone [phone] 08/29/2013 2:11 PM

## 2013-09-03 MED ORDER — DEXTROSE 5 % IV SOLN
1.5000 g | INTRAVENOUS | Status: AC
  Administered 2013-09-04: 1.5 g via INTRAVENOUS
  Filled 2013-09-03: qty 1.5

## 2013-09-03 NOTE — Progress Notes (Signed)
Left message for patient to arrive at 0830 tomorrow morning.

## 2013-09-04 ENCOUNTER — Encounter (HOSPITAL_COMMUNITY): Payer: Medicare Other | Admitting: Vascular Surgery

## 2013-09-04 ENCOUNTER — Encounter (HOSPITAL_COMMUNITY)
Admission: RE | Disposition: A | Discharge status: Home / Self Care | Payer: Self-pay | Source: Ambulatory Visit | Attending: Vascular Surgery

## 2013-09-04 ENCOUNTER — Encounter (HOSPITAL_COMMUNITY): Payer: Self-pay | Admitting: *Deleted

## 2013-09-04 ENCOUNTER — Other Ambulatory Visit: Payer: Self-pay | Admitting: *Deleted

## 2013-09-04 ENCOUNTER — Ambulatory Visit (HOSPITAL_COMMUNITY): Payer: Medicare Other | Admitting: Certified Registered Nurse Anesthetist

## 2013-09-04 ENCOUNTER — Ambulatory Visit (HOSPITAL_COMMUNITY)
Admission: RE | Admit: 2013-09-04 | Discharge: 2013-09-04 | Disposition: A | Discharge status: Home / Self Care | Payer: Medicare Other | Source: Ambulatory Visit | Attending: Vascular Surgery | Admitting: Vascular Surgery

## 2013-09-04 DIAGNOSIS — N186 End stage renal disease: Secondary | ICD-10-CM | POA: Insufficient documentation

## 2013-09-04 DIAGNOSIS — I428 Other cardiomyopathies: Secondary | ICD-10-CM | POA: Insufficient documentation

## 2013-09-04 DIAGNOSIS — I509 Heart failure, unspecified: Secondary | ICD-10-CM | POA: Insufficient documentation

## 2013-09-04 DIAGNOSIS — Z860100 Personal history of colon polyps, unspecified: Secondary | ICD-10-CM | POA: Insufficient documentation

## 2013-09-04 DIAGNOSIS — N183 Chronic kidney disease, stage 3 unspecified: Secondary | ICD-10-CM

## 2013-09-04 DIAGNOSIS — Z87891 Personal history of nicotine dependence: Secondary | ICD-10-CM | POA: Insufficient documentation

## 2013-09-04 DIAGNOSIS — F3289 Other specified depressive episodes: Secondary | ICD-10-CM | POA: Insufficient documentation

## 2013-09-04 DIAGNOSIS — Z9861 Coronary angioplasty status: Secondary | ICD-10-CM | POA: Insufficient documentation

## 2013-09-04 DIAGNOSIS — E669 Obesity, unspecified: Secondary | ICD-10-CM | POA: Insufficient documentation

## 2013-09-04 DIAGNOSIS — J449 Chronic obstructive pulmonary disease, unspecified: Secondary | ICD-10-CM | POA: Insufficient documentation

## 2013-09-04 DIAGNOSIS — F329 Major depressive disorder, single episode, unspecified: Secondary | ICD-10-CM | POA: Insufficient documentation

## 2013-09-04 DIAGNOSIS — K219 Gastro-esophageal reflux disease without esophagitis: Secondary | ICD-10-CM | POA: Insufficient documentation

## 2013-09-04 DIAGNOSIS — Z4931 Encounter for adequacy testing for hemodialysis: Secondary | ICD-10-CM

## 2013-09-04 DIAGNOSIS — I252 Old myocardial infarction: Secondary | ICD-10-CM | POA: Insufficient documentation

## 2013-09-04 DIAGNOSIS — I2510 Atherosclerotic heart disease of native coronary artery without angina pectoris: Secondary | ICD-10-CM | POA: Insufficient documentation

## 2013-09-04 DIAGNOSIS — E1149 Type 2 diabetes mellitus with other diabetic neurological complication: Secondary | ICD-10-CM | POA: Insufficient documentation

## 2013-09-04 DIAGNOSIS — E1142 Type 2 diabetes mellitus with diabetic polyneuropathy: Secondary | ICD-10-CM | POA: Insufficient documentation

## 2013-09-04 DIAGNOSIS — Z6838 Body mass index (BMI) 38.0-38.9, adult: Secondary | ICD-10-CM | POA: Insufficient documentation

## 2013-09-04 DIAGNOSIS — E1129 Type 2 diabetes mellitus with other diabetic kidney complication: Secondary | ICD-10-CM | POA: Insufficient documentation

## 2013-09-04 DIAGNOSIS — E1139 Type 2 diabetes mellitus with other diabetic ophthalmic complication: Secondary | ICD-10-CM | POA: Insufficient documentation

## 2013-09-04 DIAGNOSIS — Z794 Long term (current) use of insulin: Secondary | ICD-10-CM | POA: Insufficient documentation

## 2013-09-04 DIAGNOSIS — J4489 Other specified chronic obstructive pulmonary disease: Secondary | ICD-10-CM | POA: Insufficient documentation

## 2013-09-04 DIAGNOSIS — D649 Anemia, unspecified: Secondary | ICD-10-CM | POA: Insufficient documentation

## 2013-09-04 DIAGNOSIS — E11319 Type 2 diabetes mellitus with unspecified diabetic retinopathy without macular edema: Secondary | ICD-10-CM | POA: Insufficient documentation

## 2013-09-04 DIAGNOSIS — Z951 Presence of aortocoronary bypass graft: Secondary | ICD-10-CM | POA: Insufficient documentation

## 2013-09-04 DIAGNOSIS — Z992 Dependence on renal dialysis: Secondary | ICD-10-CM | POA: Insufficient documentation

## 2013-09-04 DIAGNOSIS — I120 Hypertensive chronic kidney disease with stage 5 chronic kidney disease or end stage renal disease: Secondary | ICD-10-CM | POA: Insufficient documentation

## 2013-09-04 HISTORY — PX: BASCILIC VEIN TRANSPOSITION: SHX5742

## 2013-09-04 LAB — GLUCOSE, CAPILLARY
Glucose-Capillary: 305 mg/dL — ABNORMAL HIGH (ref 70–99)
Glucose-Capillary: 274 mg/dL — ABNORMAL HIGH (ref 70–99)

## 2013-09-04 LAB — POCT I-STAT 4, (NA,K, GLUC, HGB,HCT)
Glucose, Bld: 301 mg/dL — ABNORMAL HIGH (ref 70–99)
HEMATOCRIT: 33 % — AB (ref 39.0–52.0)
Hemoglobin: 11.2 g/dL — ABNORMAL LOW (ref 13.0–17.0)
Potassium: 4.3 mEq/L (ref 3.7–5.3)
SODIUM: 127 meq/L — AB (ref 137–147)

## 2013-09-04 SURGERY — TRANSPOSITION, VEIN, BASILIC
Anesthesia: General | Site: Arm Upper | Laterality: Right

## 2013-09-04 MED ORDER — LIDOCAINE-EPINEPHRINE 0.5 %-1:200000 IJ SOLN
INTRAMUSCULAR | Status: DC | PRN
Start: 2013-09-04 — End: 2013-09-04

## 2013-09-04 MED ORDER — ALBUMIN HUMAN 5 % IV SOLN
INTRAVENOUS | Status: DC | PRN
  Administered 2013-09-04 (×2): via INTRAVENOUS

## 2013-09-04 MED ORDER — LIDOCAINE-EPINEPHRINE 0.5 %-1:200000 IJ SOLN
INTRAMUSCULAR | Status: AC
  Filled 2013-09-04: qty 1

## 2013-09-04 MED ORDER — FENTANYL CITRATE 0.05 MG/ML IJ SOLN: 25.0000 ug | INTRAMUSCULAR | Status: DC | PRN

## 2013-09-04 MED ORDER — PROPOFOL 10 MG/ML IV BOLUS
INTRAVENOUS | Status: AC
  Filled 2013-09-04: qty 20

## 2013-09-04 MED ORDER — 0.9 % SODIUM CHLORIDE (POUR BTL) OPTIME
TOPICAL | Status: DC | PRN
  Administered 2013-09-04: 1000 mL

## 2013-09-04 MED ORDER — ROCURONIUM BROMIDE 100 MG/10ML IV SOLN
INTRAVENOUS | Status: DC | PRN
  Administered 2013-09-04: 10 mg via INTRAVENOUS

## 2013-09-04 MED ORDER — PROPOFOL 10 MG/ML IV BOLUS
INTRAVENOUS | Status: DC | PRN
  Administered 2013-09-04: 30 mg via INTRAVENOUS
  Administered 2013-09-04: 50 mg via INTRAVENOUS

## 2013-09-04 MED ORDER — MIDAZOLAM HCL 2 MG/2ML IJ SOLN
INTRAMUSCULAR | Status: AC
Start: 2013-09-04 — End: 2013-09-04
  Filled 2013-09-04: qty 2

## 2013-09-04 MED ORDER — EPHEDRINE SULFATE 50 MG/ML IJ SOLN
INTRAMUSCULAR | Status: DC | PRN
  Administered 2013-09-04 (×2): 10 mg via INTRAVENOUS

## 2013-09-04 MED ORDER — SODIUM CHLORIDE 0.9 % IR SOLN
Status: DC | PRN
  Administered 2013-09-04: 11:00:00

## 2013-09-04 MED ORDER — FENTANYL CITRATE 0.05 MG/ML IJ SOLN
INTRAMUSCULAR | Status: DC | PRN
  Administered 2013-09-04: 50 ug via INTRAVENOUS
  Administered 2013-09-04: 100 ug via INTRAVENOUS

## 2013-09-04 MED ORDER — NEOSTIGMINE METHYLSULFATE 1 MG/ML IJ SOLN
INTRAMUSCULAR | Status: DC | PRN
  Administered 2013-09-04: 3 mg via INTRAVENOUS

## 2013-09-04 MED ORDER — GLYCOPYRROLATE 0.2 MG/ML IJ SOLN
INTRAMUSCULAR | Status: DC | PRN
  Administered 2013-09-04: .6 mg via INTRAVENOUS

## 2013-09-04 MED ORDER — HYDROCODONE-ACETAMINOPHEN 5-325 MG PO TABS
ORAL_TABLET | ORAL | Status: AC
  Filled 2013-09-04: qty 1

## 2013-09-04 MED ORDER — LIDOCAINE HCL (CARDIAC) 20 MG/ML IV SOLN
INTRAVENOUS | Status: DC | PRN
  Administered 2013-09-04: 100 mg via INTRAVENOUS

## 2013-09-04 MED ORDER — ONDANSETRON HCL 4 MG/2ML IJ SOLN
INTRAMUSCULAR | Status: DC | PRN
  Administered 2013-09-04: 4 mg via INTRAVENOUS

## 2013-09-04 MED ORDER — PHENYLEPHRINE HCL 10 MG/ML IJ SOLN
10.0000 mg | INTRAVENOUS | Status: DC | PRN
  Administered 2013-09-04: 50 ug/min via INTRAVENOUS

## 2013-09-04 MED ORDER — ONDANSETRON HCL 4 MG/2ML IJ SOLN
INTRAMUSCULAR | Status: AC
  Filled 2013-09-04: qty 2

## 2013-09-04 MED ORDER — VASOPRESSIN 20 UNIT/ML IJ SOLN
INTRAMUSCULAR | Status: DC | PRN
  Administered 2013-09-04 (×2): 1 [IU] via INTRAVENOUS

## 2013-09-04 MED ORDER — MIDAZOLAM HCL 5 MG/5ML IJ SOLN
INTRAMUSCULAR | Status: DC | PRN
  Administered 2013-09-04 (×2): 1 mg via INTRAVENOUS

## 2013-09-04 MED ORDER — OXYCODONE-ACETAMINOPHEN 5-325 MG PO TABS
ORAL_TABLET | ORAL | Status: AC
  Filled 2013-09-04: qty 1

## 2013-09-04 MED ORDER — LIDOCAINE HCL (CARDIAC) 20 MG/ML IV SOLN
INTRAVENOUS | Status: AC
  Filled 2013-09-04: qty 5

## 2013-09-04 MED ORDER — PHENYLEPHRINE HCL 10 MG/ML IJ SOLN
INTRAMUSCULAR | Status: DC | PRN
  Administered 2013-09-04 (×5): 80 ug via INTRAVENOUS

## 2013-09-04 MED ORDER — OXYCODONE-ACETAMINOPHEN 5-325 MG PO TABS: 1.0000 | ORAL_TABLET | Freq: Four times a day (QID) | ORAL | Status: AC | PRN

## 2013-09-04 MED ORDER — INSULIN ASPART 100 UNIT/ML ~~LOC~~ SOLN
SUBCUTANEOUS | Status: DC | PRN
  Administered 2013-09-04: 12 [IU] via SUBCUTANEOUS

## 2013-09-04 MED ORDER — SODIUM CHLORIDE 0.9 % IV SOLN
INTRAVENOUS | Status: DC
  Administered 2013-09-04 (×2): via INTRAVENOUS

## 2013-09-04 MED ORDER — FENTANYL CITRATE 0.05 MG/ML IJ SOLN
INTRAMUSCULAR | Status: AC
  Filled 2013-09-04: qty 5

## 2013-09-04 MED ORDER — OXYCODONE-ACETAMINOPHEN 5-325 MG PO TABS
1.0000 | ORAL_TABLET | Freq: Once | ORAL | Status: AC
  Administered 2013-09-04: 1 via ORAL

## 2013-09-04 MED ORDER — ONDANSETRON HCL 4 MG/2ML IJ SOLN: 4.0000 mg | Freq: Once | INTRAMUSCULAR | Status: DC | PRN

## 2013-09-04 SURGICAL SUPPLY — 42 items
APL SKNCLS STERI-STRIP NONHPOA (GAUZE/BANDAGES/DRESSINGS) ×1
BENZOIN TINCTURE PRP APPL 2/3 (GAUZE/BANDAGES/DRESSINGS) ×3 IMPLANT
CANISTER SUCTION 2500CC (MISCELLANEOUS) ×3 IMPLANT
CLIP LIGATING EXTRA MED SLVR (CLIP) ×3 IMPLANT
CLIP LIGATING EXTRA SM BLUE (MISCELLANEOUS) ×3 IMPLANT
CLOSURE WOUND 1/2 X4 (GAUZE/BANDAGES/DRESSINGS) ×1
COVER PROBE W GEL 5X96 (DRAPES) ×3 IMPLANT
COVER SURGICAL LIGHT HANDLE (MISCELLANEOUS) ×3 IMPLANT
DECANTER SPIKE VIAL GLASS SM (MISCELLANEOUS) ×3 IMPLANT
ELECT REM PT RETURN 9FT ADLT (ELECTROSURGICAL) ×3
ELECTRODE REM PT RTRN 9FT ADLT (ELECTROSURGICAL) ×1 IMPLANT
GEL ULTRASOUND 20GR AQUASONIC (MISCELLANEOUS) IMPLANT
GLOVE BIOGEL PI IND STRL 6.5 (GLOVE) IMPLANT
GLOVE BIOGEL PI IND STRL 7.0 (GLOVE) IMPLANT
GLOVE BIOGEL PI INDICATOR 6.5 (GLOVE) ×2
GLOVE BIOGEL PI INDICATOR 7.0 (GLOVE) ×4
GLOVE ECLIPSE 6.5 STRL STRAW (GLOVE) ×2 IMPLANT
GLOVE SS BIOGEL STRL SZ 7.5 (GLOVE) ×1 IMPLANT
GLOVE SUPERSENSE BIOGEL SZ 7.5 (GLOVE) ×2
GLOVE SURG SS PI 6.0 STRL IVOR (GLOVE) ×2 IMPLANT
GLOVE SURG SS PI 7.0 STRL IVOR (GLOVE) ×4 IMPLANT
GOWN STRL REUS W/ TWL LRG LVL3 (GOWN DISPOSABLE) ×3 IMPLANT
GOWN STRL REUS W/ TWL XL LVL3 (GOWN DISPOSABLE) IMPLANT
GOWN STRL REUS W/TWL LRG LVL3 (GOWN DISPOSABLE) ×6
GOWN STRL REUS W/TWL XL LVL3 (GOWN DISPOSABLE) ×3
KIT BASIN OR (CUSTOM PROCEDURE TRAY) ×3 IMPLANT
KIT ROOM TURNOVER OR (KITS) ×3 IMPLANT
NS IRRIG 1000ML POUR BTL (IV SOLUTION) ×3 IMPLANT
PACK CV ACCESS (CUSTOM PROCEDURE TRAY) ×3 IMPLANT
PAD ARMBOARD 7.5X6 YLW CONV (MISCELLANEOUS) ×6 IMPLANT
SPONGE GAUZE 4X4 12PLY (GAUZE/BANDAGES/DRESSINGS) ×3 IMPLANT
SPONGE GAUZE 4X4 12PLY STER LF (GAUZE/BANDAGES/DRESSINGS) ×2 IMPLANT
STRIP CLOSURE SKIN 1/2X4 (GAUZE/BANDAGES/DRESSINGS) ×2 IMPLANT
SUT PROLENE 6 0 CC (SUTURE) ×5 IMPLANT
SUT SILK 2 0 SH (SUTURE) ×5 IMPLANT
SUT VIC AB 3-0 SH 27 (SUTURE) ×6
SUT VIC AB 3-0 SH 27X BRD (SUTURE) ×1 IMPLANT
TAPE CLOTH SURG 4X10 WHT LF (GAUZE/BANDAGES/DRESSINGS) ×2 IMPLANT
TOWEL OR 17X24 6PK STRL BLUE (TOWEL DISPOSABLE) ×3 IMPLANT
TOWEL OR 17X26 10 PK STRL BLUE (TOWEL DISPOSABLE) ×3 IMPLANT
UNDERPAD 30X30 INCONTINENT (UNDERPADS AND DIAPERS) ×3 IMPLANT
WATER STERILE IRR 1000ML POUR (IV SOLUTION) ×3 IMPLANT

## 2013-09-04 NOTE — Anesthesia Postprocedure Evaluation (Signed)
  Anesthesia Post-op Note  Patient: [name]  Procedure(s) Performed: Procedure(s): BASILIC VEIN TRANSPOSITION (Right)  Patient Location: PACU  Anesthesia Type:General  Level of Consciousness: awake, alert  and oriented  Airway and Oxygen Therapy: Patient Spontanous Breathing and Patient connected to nasal cannula oxygen  Post-op Pain: mild  Post-op Assessment: Post-op Vital signs reviewed, Patient's Cardiovascular Status Stable, Respiratory Function Stable, Patent Airway, No signs of Nausea or vomiting and Pain level controlled  Post-op Vital Signs: Reviewed and stable  Complications: No apparent anesthesia complications

## 2013-09-04 NOTE — Progress Notes (Signed)
CBG=301, Dr. Ermalene Postin called and informed of abnormal result and that pt was given 45 units of Lantus last night and 20 units this morning.

## 2013-09-04 NOTE — Preoperative (Signed)
Beta Blockers   Reason not to administer Beta Blockers:Not Applicable 

## 2013-09-04 NOTE — Op Note (Signed)
    OPERATIVE REPORT  DATE OF SURGERY: 09/04/2013  PATIENT: [name], 73 y.o. male MRN: [id number]  DOB: [birth year]  PRE-OPERATIVE DIAGNOSIS: Chronic renal insufficiency  POST-OPERATIVE DIAGNOSIS:  Same  PROCEDURE: Right basilic vein transposition AV fistula  SURGEON:  Curt Jews, M.D.  PHYSICIAN ASSISTANT: Nurse  ANESTHESIA:  Gen.  EBL: Minimal ml  Total I/O In: 1320 [P.O.:120; I.V.:700; IV Piggyback:500] Out: -   BLOOD ADMINISTERED: None none  DRAINS: None  SPECIMEN: None  COUNTS CORRECT:  YES  PLAN OF CARE: PACU   PATIENT DISPOSITION:  PACU - hemodynamically stable  PROCEDURE DETAILS: The patient was taken up through place supervision an area the right arm was prepped and draped in usual sterile fashion. SonoSite ultrasound was used to mark the basilic vein which was of excellent caliber. Incision was made longitudinally over the antecubital space and carried down to isolate the basilic vein which was in fact very good caliber. The vein was mobilized proximally and distally to this incision and tributary branches were ligated with 30 and 4-0 silk ties and divided. The brachial artery was exposed through the same incision. There had been a prior cephalic vein fistula placement years ago which was never matured. The cephalic vein was ligated and divided. Next a separate incision was made above the elbow and then one at the axilla into these incisions the basilic vein was mobilized. Her branches were ligated and divided. The vein was ligated distally and was brought out through the tunnel and left attached at the axillary vein. The vein was gently dilated and was of excellent caliber. A tunnel was created from the level of the brachial artery up to the axilla and the vein was brought through this tunnel. This was a very subcuticular and also was care taken to prevent twisting. The brachial artery was occluded proximal and distal to the old anastomosis. The anastomosis was  excised. The vein was cut to appropriate length and was sewn end-to-side to the brachial artery with a running 6-0 Prolene suture. Prior to completion of the anastomosis the clamps removed and excellent thrill was noted through the vein. The wounds were irrigated with saline. Hemostasis daily cautery. Wounds were closed with 3-0 Vicryl in the subcutaneous and subcuticular tissue. Benzoin Steri-Strips were applied. The patient was taken to the recovery room in stable condition   Curt Jews, M.D. 09/04/2013 6:12 PM

## 2013-09-04 NOTE — Transfer of Care (Signed)
Immediate Anesthesia Transfer of Care Note  Patient: [name]  Procedure(s) Performed: Procedure(s): BASILIC VEIN TRANSPOSITION (Right)  Patient Location: PACU  Anesthesia Type:General  Level of Consciousness: awake, alert  and oriented  Airway & Oxygen Therapy: Patient Spontanous Breathing  Post-op Assessment: Report given to PACU RN  Post vital signs: Reviewed and stable  Complications: No apparent anesthesia complications
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
## 2013-09-04 NOTE — Anesthesia Preprocedure Evaluation (Signed)
Anesthesia Evaluation  Patient identified by MRN, date of birth, ID band Patient awake    Reviewed: Allergy & Precautions, H&P , NPO status , Patient's Chart, lab work & pertinent test results, reviewed documented beta blocker date and time   History of Anesthesia Complications Negative for: history of anesthetic complications  Airway Mallampati: II TM Distance: >3 FB Neck ROM: Full    Dental  (+) Edentulous Upper, Edentulous Lower   Pulmonary shortness of breath, with exertion and lying, neg sleep apnea, COPD COPD inhaler, former smoker,    + wheezing      Cardiovascular Exercise Tolerance: Poor METS: 3 - Mets hypertension, Pt. on medications and Pt. on home beta blockers - angina+ CAD, + Past MI, + Cardiac Stents, + CABG, +CHF and + DOE - dysrhythmias Rhythm:Regular Rate:Normal     Neuro/Psych  Headaches, PSYCHIATRIC DISORDERS Depression Hearing and visually impaired    GI/Hepatic Neg liver ROS, GERD-  Medicated and Controlled,  Endo/Other  diabetes, Poorly Controlled, Type 2, Insulin DependentMorbid obesity  Renal/GU CRFRenal disease     Musculoskeletal   Abdominal (+) + obese,   Peds  Hematology  (+) anemia ,   Anesthesia Other Findings   Reproductive/Obstetrics                           Anesthesia Physical Anesthesia Plan  ASA: III  Anesthesia Plan: General   Post-op Pain Management:    Induction: Intravenous  Airway Management Planned: Oral ETT  Additional Equipment: None  Intra-op Plan:   Post-operative Plan: Extubation in OR  Informed Consent: I have reviewed the patients History and Physical, chart, labs and discussed the procedure including the risks, benefits and alternatives for the proposed anesthesia with the patient or authorized representative who has indicated his/her understanding and acceptance.     Plan Discussed with:   Anesthesia Plan Comments:          Anesthesia Quick Evaluation

## 2013-09-05 ENCOUNTER — Telehealth: Payer: Self-pay | Admitting: Vascular Surgery

## 2013-09-05 NOTE — Telephone Encounter (Addendum)
Message copied by Gena Fray on Thu Sep 05, 2013  3:14 PM ------      Message from: Peter Minium K      Created: Thu Sep 05, 2013  8:24 AM      Regarding: schedule       2 PATIENTS  [name] and [name]                   ----- Message -----         From: Rosetta Posner, MD         Sent: 09/04/2013   6:14 PM           To: [facility] Charge Pool            Right basilic vein transposition. Procedure was done on the same step with the transposing the vein and anastomosis assistant nurse            I need to see him in one month for followup. Does not need of vascular lab                       ------  09/05/13: lm for pts wife, dpm

## 2013-09-06 ENCOUNTER — Encounter (HOSPITAL_COMMUNITY): Payer: Self-pay | Admitting: Vascular Surgery

## 2013-09-16 ENCOUNTER — Telehealth: Payer: Self-pay | Admitting: *Deleted

## 2013-09-16 NOTE — Telephone Encounter (Signed)
Returning [name]' call regarding her husband's right arm swelling.  [name] states she noticed "his right arm swelling for a couple of days."  She states [name] has not been elevating his right arm.  She denies that her husband is experiencing fever/chills, inflammation, or drainage. She states his right arm is "a little sore."  Encouraged her to have her husband elevate his right arm above the level of his heart as much as possible.  Advised her to call [facility] tomorrow if swelling did not improve after elevation.  [name] verbalized understanding.

## 2013-09-23 ENCOUNTER — Encounter: Payer: Self-pay | Admitting: Vascular Surgery

## 2013-09-24 ENCOUNTER — Encounter: Payer: Self-pay | Admitting: Vascular Surgery

## 2013-09-24 ENCOUNTER — Other Ambulatory Visit (HOSPITAL_COMMUNITY): Payer: Medicare Other

## 2013-09-24 ENCOUNTER — Ambulatory Visit (INDEPENDENT_AMBULATORY_CARE_PROVIDER_SITE_OTHER): Payer: Self-pay | Admitting: Vascular Surgery

## 2013-09-24 VITALS — BP 139/65 | HR 72 | Resp 18 | Ht 70.0 in | Wt 283.4 lb

## 2013-09-24 DIAGNOSIS — N186 End stage renal disease: Secondary | ICD-10-CM

## 2013-09-24 NOTE — Progress Notes (Signed)
Here today for followup of right basilic vein transposition by myself on 09/04/2013. He has done well with this. He is here today with his wife. This is extremely hard of hearing. He is present the difficulty associated with his fistula  Physical exam he does have some erythema the antecubital incision related to absorption of the Vicryl suture. No evidence of deep infection. His the mid arm and axillary incisions are completely healed. He has excellent Marely Apgar maturation of his basilic vein fistula. There is excellent thrill and excellent size maturation. This is quite superficial and I feel that he has a very high likelihood that this would be acceptable for hemodialysis if he needs access.  He is to followup with Dr. Lorrene Reid tomorrow. I feel that he should wait a total of 3 months from his procedure and at that time should be able to use his access at any time. He will see [place] again on an as-needed basis

## 2013-10-11 ENCOUNTER — Other Ambulatory Visit: Payer: Self-pay

## 2013-10-11 MED ORDER — ATORVASTATIN CALCIUM 80 MG PO TABS: 80.0000 mg | ORAL_TABLET | Freq: Every day | ORAL | Status: DC

## 2013-12-12 ENCOUNTER — Encounter: Payer: Self-pay | Admitting: *Deleted

## 2014-02-14 ENCOUNTER — Ambulatory Visit: Payer: Medicare Other | Admitting: Cardiology

## 2014-02-18 ENCOUNTER — Encounter: Payer: Self-pay | Admitting: Cardiology

## 2014-02-18 ENCOUNTER — Ambulatory Visit (INDEPENDENT_AMBULATORY_CARE_PROVIDER_SITE_OTHER): Payer: Medicare Other | Admitting: Cardiology

## 2014-02-18 VITALS — BP 122/60 | HR 70 | Ht 70.0 in | Wt 298.0 lb

## 2014-02-18 DIAGNOSIS — E782 Mixed hyperlipidemia: Secondary | ICD-10-CM

## 2014-02-18 DIAGNOSIS — I10 Essential (primary) hypertension: Secondary | ICD-10-CM

## 2014-02-18 DIAGNOSIS — I2510 Atherosclerotic heart disease of native coronary artery without angina pectoris: Secondary | ICD-10-CM

## 2014-02-18 DIAGNOSIS — I5032 Chronic diastolic (congestive) heart failure: Secondary | ICD-10-CM

## 2014-02-18 MED ORDER — ASPIRIN 81 MG PO TABS: 81.0000 mg | ORAL_TABLET | Freq: Every day | ORAL | Status: AC

## 2014-02-18 NOTE — Patient Instructions (Addendum)
Your physician has recommended you make the following change in your medication: 1. Decrease Aspirin to 81 MG daily  Your physician recommends that you return for lab work for Fasting Lipid Panel and ALT (schedule with Check out before leaving today)  Your physician wants you to follow-up in: 6 months with Dr Mallie Snooks will receive a reminder letter in the mail two months in advance. If you don't receive a letter, please call our office to schedule the follow-up appointment.

## 2014-02-18 NOTE — Progress Notes (Signed)
[name], [name], [place]  [address] Phone: [phone] Fax:  [phone]  Date:  02/18/2014   ID:  [name], DOB 1940-07-05, MRN [id number]  PCP:  Wenda Low, MD  Cardiologist:  Fransico Him, MD     History of Present Illness: [name] is a 73 y.o. male with a history of ASCAD, HTN, dyslipidemia who presents today for followup. He is doing well from a cardiac standpoint. He denies any chest pain, SOB, DOE, dizziness, palpitations or syncope. He has chronic LE edema. He has had a cough recently but no fever or chills.   Wt Readings from Last 3 Encounters:  02/18/14 298 lb (135.172 kg)  09/24/13 283 lb 6.4 oz (128.549 kg)  08/28/13 265 lb (120.203 kg)     Past Medical History  Diagnosis Date  . Myocardial infarction   . Shortness of breath   . Hypertension   . Depression   . COPD (chronic obstructive pulmonary disease)   . Cardiomyopathy     Mild Ischemic-LVEF 40-45%  . GERD (gastroesophageal reflux disease)   . Headache(784.0)   . Arthritis   . Lipoma   . Hearing loss   . Gastroparesis   . Hemorrhoids   . Colon polyps   . Diabetes mellitus     insulin dependent with neuropathy, nephropathy, retinopathy  . Blindness of right eye     due to diabetic retinopathy  . Obesity     marked centripetal obesity  . Depression     mood disorder  . Hearing loss   . Fistula     Right arm nonfunctional  . NSTEMI (non-ST elevated myocardial infarction) 10/2004    PCI/DES LCXo/w 50-60% diagonal lesion, 60% LAD, 40 %distal RCA  . BPH with urinary obstruction     urinary flow problem now on flomax  . Coronary artery disease 9/13    NSTEMI 2006 with PCI DES left circ 11/18/2004 with residual 50-60% diagonal, 60% LAD, 40% distal RCA.  Nuclear stress test 8/13 with inferior ischemia s/p cath with 80% distal LM, 95% ostial D1, 70% mid LAD stenosis, 95% ostial OM2 s/p CABG with LIMA to LAD, SVG to OM/left circ and SVG to diag  . Postoperative atrial  fibrillation   . Anemia   . Chronic systolic CHF (congestive heart failure)     mild ischemic DCM EF 40-45% by cath 2006  . Chronic kidney disease     stage 4    Current Outpatient Prescriptions  Medication Sig Dispense Refill  . albuterol (PROVENTIL HFA;VENTOLIN HFA) 108 (90 BASE) MCG/ACT inhaler Inhale 1 puff into the lungs every 6 (six) hours as needed for wheezing or shortness of breath. For shortness of breath      . aspirin 325 MG tablet Take 325 mg by mouth daily.      Marland Kitchen atorvastatin (LIPITOR) 80 MG tablet Take 80 mg by mouth daily. 2 tablets 3 times daily      . B Complex-C (SUPER B COMPLEX PO) Take 1 tablet by mouth daily.      . brimonidine (ALPHAGAN) 0.15 % ophthalmic solution Place 1 drop into the left eye 2 (two) times daily.       . Calcium Citrate-Vitamin D (CALCIUM CITRATE + D PO) Take 630 mg by mouth 2 (two) times daily. Calcium Citrate with Vitamin D 630mg /500iu      . cholecalciferol (VITAMIN D) 1000 UNITS tablet Take 1,000 Units by mouth daily.      Marland Kitchen  citalopram (CELEXA) 40 MG tablet Take 40 mg by mouth daily.      . dorzolamide-timolol (COSOPT) 22.3-6.8 MG/ML ophthalmic solution Place 1 drop into the left eye 2 (two) times daily. Administer 5 minutes after Lotemax and Alphagan      . ferrous sulfate 325 (65 FE) MG tablet Take 325 mg by mouth 2 (two) times daily with a meal.       . finasteride (PROSCAR) 5 MG tablet Take 5 mg by mouth daily.       . furosemide (LASIX) 80 MG tablet Take 160 mg by mouth 3 (three) times daily. 1 tablet every morning and a extra 80mg  at 2pm      . insulin glargine (LANTUS) 100 UNIT/ML injection Inject 40-45 Units into the skin 2 (two) times daily. 40 units in the morning and 45 units 12 hours later      . insulin lispro (HUMALOG) 100 UNIT/ML injection Inject 3-20 Units into the skin 3 (three) times daily before meals. For CBG>150 120-160 3 untis 161-200 6 units 201-250 12 units 251-300 16 units 301-350 20 units      . ipratropium  (ATROVENT) 0.03 % nasal spray Place 2 sprays into both nostrils 2 (two) times daily.       Marland Kitchen loteprednol (LOTEMAX) 0.5 % ophthalmic suspension Place 1 drop into the right eye 2 (two) times daily.      . metoprolol tartrate (LOPRESSOR) 25 MG tablet Take 12.5 mg by mouth 2 (two) times daily.       . midodrine (PROAMATINE) 5 MG tablet Take 2.5 mg by mouth daily.       . Multiple Vitamins-Minerals (CENTRUM SILVER ADULT 50+ PO) Take 1 tablet by mouth daily.      . pantoprazole (PROTONIX) 40 MG tablet Take 40 mg by mouth daily.      Vladimir Faster Glycol-Propyl Glycol (SYSTANE) 0.4-0.3 % SOLN Place 1 drop into both eyes 2 (two) times daily. Administer 10 minutes after Cosopt      . risperiDONE (RISPERDAL) 1 MG tablet Take 2 mg by mouth at bedtime.       . Tamsulosin HCl (FLOMAX) 0.4 MG CAPS Take 1 capsule (0.4 mg total) by mouth daily after supper.  30 capsule    . FIBER FORMULA PO Take 1,000 mg by mouth daily.       Marland Kitchen oxyCODONE-acetaminophen (PERCOCET/ROXICET) 5-325 MG per tablet Take 1 tablet by mouth every 6 (six) hours as needed.  30 tablet  0   No current facility-administered medications for this visit.    Allergies:    Allergies  Allergen Reactions  . Tamiflu [Oseltamivir] Other (See Comments)    Confusion and "talking out of his head"    Social History:  The patient  reports that he quit smoking about 24 years ago. His smoking use included Cigarettes. He smoked 0.00 packs per day. He quit smokeless tobacco use about 26 years ago. He reports that he does not drink alcohol or use illicit drugs.   Family History:  The patient's family history is not on file.   ROS:  Please see the history of present illness.      All other systems reviewed and negative.   PHYSICAL EXAM: VS:  BP 122/60  Pulse 70  Ht 5\' 10"  (1.778 m)  Wt 298 lb (135.172 kg)  BMI 42.76 kg/m2 Well nourished, well developed, in no acute distress HEENT: normal Neck: no JVD Cardiac:  normal S1, S2; RRR; no murmur Lungs:  clear to auscultation bilaterally, no wheezing, rhonchi or rales Abd: soft, nontender, no hepatomegaly Ext: no edema Skin: warm and dry Neuro:  CNs 2-12 intact, no focal abnormalities noted  ASSESSMENT AND PLAN:  1. ASCAD with no angina - continue ASA and decrease to 81mg  daily 2. HTN - well controlled  - continue metoprolol  3. Dyslipidemia  - continue atorvastatin  - check FLP and ALT 4. Chronic diastolic CHF well compensated with trace edema but sits all day with his legs dependent  - continue Lasix/beta blocker  5.  Orthostatic hypotension - controlled on midodrine   Followup with me in 6 months       Signed, Fransico Him, MD 02/18/2014 4:17 PM

## 2014-03-03 ENCOUNTER — Other Ambulatory Visit: Payer: Medicare Other

## 2014-05-29 ENCOUNTER — Encounter (HOSPITAL_COMMUNITY): Payer: Self-pay | Admitting: Cardiology

## 2014-06-24 ENCOUNTER — Encounter: Payer: Self-pay | Admitting: Cardiology

## 2014-07-21 DEATH — deceased

## 2014-07-24 ENCOUNTER — Encounter: Payer: Self-pay | Admitting: Cardiology

## 2014-08-18 ENCOUNTER — Telehealth: Payer: Self-pay

## 2014-08-18 NOTE — Telephone Encounter (Signed)
Patient died @ Home per [facility] °
